# Patient Record
Sex: Male | Born: 1950 | Race: White | Hispanic: No | Marital: Married | Smoking: Former smoker
Health system: Southern US, Community
[De-identification: ages and names within clinical notes are randomized; demographics above are authoritative.]

## PROBLEM LIST (undated history)

## (undated) DIAGNOSIS — H04123 Dry eye syndrome of bilateral lacrimal glands: Secondary | ICD-10-CM

## (undated) DIAGNOSIS — K219 Gastro-esophageal reflux disease without esophagitis: Secondary | ICD-10-CM

## (undated) DIAGNOSIS — Z87898 Personal history of other specified conditions: Secondary | ICD-10-CM

## (undated) DIAGNOSIS — F419 Anxiety disorder, unspecified: Secondary | ICD-10-CM

## (undated) DIAGNOSIS — G4730 Sleep apnea, unspecified: Secondary | ICD-10-CM

## (undated) DIAGNOSIS — I2510 Atherosclerotic heart disease of native coronary artery without angina pectoris: Secondary | ICD-10-CM

## (undated) DIAGNOSIS — Z87442 Personal history of urinary calculi: Secondary | ICD-10-CM

## (undated) DIAGNOSIS — N489 Disorder of penis, unspecified: Secondary | ICD-10-CM

## (undated) DIAGNOSIS — R05 Cough: Secondary | ICD-10-CM

## (undated) DIAGNOSIS — R51 Headache: Secondary | ICD-10-CM

## (undated) DIAGNOSIS — R609 Edema, unspecified: Secondary | ICD-10-CM

## (undated) DIAGNOSIS — A6002 Herpesviral infection of other male genital organs: Secondary | ICD-10-CM

## (undated) DIAGNOSIS — M3500 Sicca syndrome, unspecified: Secondary | ICD-10-CM

## (undated) DIAGNOSIS — I10 Essential (primary) hypertension: Secondary | ICD-10-CM

## (undated) DIAGNOSIS — I219 Acute myocardial infarction, unspecified: Secondary | ICD-10-CM

## (undated) DIAGNOSIS — M797 Fibromyalgia: Secondary | ICD-10-CM

## (undated) DIAGNOSIS — R053 Chronic cough: Secondary | ICD-10-CM

## (undated) DIAGNOSIS — G629 Polyneuropathy, unspecified: Secondary | ICD-10-CM

## (undated) DIAGNOSIS — I509 Heart failure, unspecified: Secondary | ICD-10-CM

## (undated) HISTORY — PX: CIRCUMCISION: SUR203

## (undated) HISTORY — PX: MEATOTOMY: SUR859

## (undated) HISTORY — PX: WRIST FRACTURE SURGERY: SHX121

## (undated) HISTORY — PX: VASECTOMY: SHX75

## (undated) HISTORY — PX: OTHER SURGICAL HISTORY: SHX169

## (undated) HISTORY — PX: HERNIA REPAIR: SHX51

## (undated) HISTORY — PX: CORONARY ANGIOPLASTY WITH STENT PLACEMENT: SHX49

## (undated) HISTORY — PX: BREAST SURGERY: SHX581

---

## 2004-08-20 ENCOUNTER — Encounter: Payer: Self-pay | Admitting: Cardiology

## 2005-02-24 ENCOUNTER — Encounter: Payer: Self-pay | Admitting: Cardiology

## 2005-02-24 ENCOUNTER — Ambulatory Visit: Payer: Self-pay | Admitting: Cardiology

## 2005-06-02 ENCOUNTER — Ambulatory Visit: Payer: Self-pay | Admitting: Cardiology

## 2005-06-13 ENCOUNTER — Inpatient Hospital Stay (HOSPITAL_BASED_OUTPATIENT_CLINIC_OR_DEPARTMENT_OTHER): Admission: RE | Admit: 2005-06-13 | Discharge: 2005-06-13 | Payer: Self-pay | Admitting: Cardiology

## 2005-06-13 ENCOUNTER — Ambulatory Visit: Payer: Self-pay | Admitting: Cardiology

## 2005-06-17 ENCOUNTER — Ambulatory Visit: Payer: Self-pay | Admitting: Cardiology

## 2006-09-10 ENCOUNTER — Ambulatory Visit: Payer: Self-pay | Admitting: Cardiology

## 2006-10-26 ENCOUNTER — Ambulatory Visit: Payer: Self-pay | Admitting: Cardiology

## 2007-12-31 ENCOUNTER — Ambulatory Visit: Payer: Self-pay | Admitting: Cardiology

## 2007-12-31 ENCOUNTER — Encounter: Payer: Self-pay | Admitting: Cardiology

## 2008-06-13 ENCOUNTER — Ambulatory Visit: Payer: Self-pay | Admitting: Cardiology

## 2008-06-13 DIAGNOSIS — I2510 Atherosclerotic heart disease of native coronary artery without angina pectoris: Secondary | ICD-10-CM

## 2008-06-13 DIAGNOSIS — Z9861 Coronary angioplasty status: Secondary | ICD-10-CM

## 2008-06-13 DIAGNOSIS — R0602 Shortness of breath: Secondary | ICD-10-CM

## 2008-06-13 DIAGNOSIS — G9009 Other idiopathic peripheral autonomic neuropathy: Secondary | ICD-10-CM | POA: Insufficient documentation

## 2008-06-18 ENCOUNTER — Encounter: Payer: Self-pay | Admitting: Cardiology

## 2008-07-14 ENCOUNTER — Encounter: Payer: Self-pay | Admitting: Cardiology

## 2008-07-15 ENCOUNTER — Encounter: Payer: Self-pay | Admitting: Cardiology

## 2008-08-04 ENCOUNTER — Ambulatory Visit: Payer: Self-pay | Admitting: Cardiology

## 2008-08-08 ENCOUNTER — Encounter: Payer: Self-pay | Admitting: Cardiology

## 2008-08-10 ENCOUNTER — Inpatient Hospital Stay (HOSPITAL_BASED_OUTPATIENT_CLINIC_OR_DEPARTMENT_OTHER): Admission: RE | Admit: 2008-08-10 | Discharge: 2008-08-10 | Payer: Self-pay | Admitting: Cardiology

## 2008-08-10 ENCOUNTER — Ambulatory Visit: Payer: Self-pay | Admitting: Cardiology

## 2008-08-15 ENCOUNTER — Inpatient Hospital Stay (HOSPITAL_COMMUNITY): Admission: RE | Admit: 2008-08-15 | Discharge: 2008-08-16 | Payer: Self-pay | Admitting: Cardiology

## 2008-08-15 ENCOUNTER — Ambulatory Visit: Payer: Self-pay | Admitting: Cardiology

## 2008-08-25 ENCOUNTER — Encounter: Payer: Self-pay | Admitting: Cardiology

## 2008-08-26 ENCOUNTER — Inpatient Hospital Stay (HOSPITAL_COMMUNITY): Admission: EM | Admit: 2008-08-26 | Discharge: 2008-08-26 | Payer: Self-pay | Admitting: Cardiology

## 2008-08-26 ENCOUNTER — Encounter: Payer: Self-pay | Admitting: Cardiology

## 2008-08-26 ENCOUNTER — Ambulatory Visit: Payer: Self-pay | Admitting: Cardiovascular Disease

## 2008-09-05 ENCOUNTER — Ambulatory Visit: Payer: Self-pay | Admitting: Cardiology

## 2009-02-09 ENCOUNTER — Encounter: Payer: Self-pay | Admitting: Cardiology

## 2009-05-23 ENCOUNTER — Encounter: Payer: Self-pay | Admitting: Cardiology

## 2009-06-07 ENCOUNTER — Ambulatory Visit: Payer: Self-pay | Admitting: Cardiology

## 2009-06-07 DIAGNOSIS — E785 Hyperlipidemia, unspecified: Secondary | ICD-10-CM

## 2009-07-31 ENCOUNTER — Ambulatory Visit (HOSPITAL_COMMUNITY): Admission: RE | Admit: 2009-07-31 | Discharge: 2009-07-31 | Payer: Self-pay | Admitting: Neurology

## 2009-08-14 ENCOUNTER — Encounter: Payer: Self-pay | Admitting: Cardiology

## 2010-02-15 ENCOUNTER — Telehealth (INDEPENDENT_AMBULATORY_CARE_PROVIDER_SITE_OTHER): Payer: Self-pay | Admitting: *Deleted

## 2010-04-02 ENCOUNTER — Encounter: Payer: Self-pay | Admitting: Cardiology

## 2010-04-02 ENCOUNTER — Inpatient Hospital Stay (HOSPITAL_COMMUNITY)
Admission: RE | Admit: 2010-04-02 | Discharge: 2010-04-09 | Payer: Self-pay | Source: Home / Self Care | Admitting: Orthopedic Surgery

## 2010-04-05 ENCOUNTER — Inpatient Hospital Stay: Admission: RE | Admit: 2010-04-05 | Payer: Self-pay | Admitting: Orthopedic Surgery

## 2010-04-06 ENCOUNTER — Encounter: Payer: Self-pay | Admitting: Cardiology

## 2010-05-06 ENCOUNTER — Encounter: Payer: Self-pay | Admitting: Cardiology

## 2010-05-06 ENCOUNTER — Ambulatory Visit
Admission: AD | Admit: 2010-05-06 | Discharge: 2010-05-06 | Payer: Self-pay | Source: Home / Self Care | Attending: Neurology | Admitting: Neurology

## 2010-06-07 ENCOUNTER — Ambulatory Visit
Admission: RE | Admit: 2010-06-07 | Discharge: 2010-06-07 | Payer: Self-pay | Source: Home / Self Care | Attending: Cardiology | Admitting: Cardiology

## 2010-06-07 ENCOUNTER — Encounter: Payer: Self-pay | Admitting: Cardiology

## 2010-06-11 NOTE — Progress Notes (Signed)
Summary: RX REFILL PLAVIX   Phone Note Refill Request Call back at [phone] Message from:  Patient on February 15, 2010 12:12 PM  Refills Requested: Medication #1:  PLAVIX 75 MG TABS Take 1 tablet by mouth once a day   Dosage confirmed as above?Dosage Confirmed   Supply Requested: 6 months message left on nurse voicemail that [facility] in [place] sent request for plavix and we didn't respond. Nurse left message on patient's voicemail that no request came to our office and we would send one to them today.   Method Requested: Electronic Initial call taken by: Carlye Grippe,  February 15, 2010 12:13 PM    Prescriptions: PLAVIX 75 MG TABS (CLOPIDOGREL BISULFATE) Take 1 tablet by mouth once a day  #30 x 6   Entered by:   Carlye Grippe   Authorized by:   Rollene Rotunda, MD, [facility]   Signed by:   Carlye Grippe on 02/15/2010   Method used:   Electronically to        [facility]* (retail)       [address]       [place], [place]  [address]       Ph: [phone]       Fax: [phone]   RxID:   [id number]

## 2010-06-11 NOTE — Letter (Signed)
Summary: Appointment- Rescheduled  [facility] [address] [address]   [place], [place] [address]   Phone: [phone]  Fax: [phone]     May 23, 2009 MRN: [id number]      [facility] [address] [place], [place]  [address]      Dear Mr. Hirschfeld,   Due to a change in our office schedule, your appointment on JAN 27th with  Dr. Andee Lineman has been changed to Jan 27th with Dr. Antoine Poche @ 10:30.   Please call the office only if you wish not to keep appointment.   We look forward to participating in your health care needs.      Sincerely,  Glass blower/designer

## 2010-06-11 NOTE — Assessment & Plan Note (Signed)
Summary: 6 mo fu  per oct reminder-srs  Medications Added ROBAXIN-750 750 MG TABS (METHOCARBAMOL) Take 1 tablet by mouth once daily PRILOSEC 40 MG CPDR (OMEPRAZOLE) Take 1 tablet by mouth once a day POTASSIUM CHLORIDE CR 10 MEQ CR-CAPS (POTASSIUM CHLORIDE) Take one tablet by mouth two times a day GUAIFENESIN 400 MG TABS (GUAIFENESIN) as needed BENADRYL 25 MG TABS (DIPHENHYDRAMINE HCL) as needed CLONAZEPAM 0.5 MG TABS (CLONAZEPAM) Take 1-2 tablets once daily as needed NITROSTAT 0.4 MG SUBL (NITROGLYCERIN) 1 tablet under tongue at onset of chest pain; you may repeat every 5 minutes for up to 3 doses. DILAUDID 4 MG TABS (HYDROMORPHONE HCL) Take 1 tablet every 6 hrs as needed HYPOTEARS 1-1 % SOLN (POLYETHYL GLYCOL-POLYVINYL ALC) as needed * VITAMIN D 8000 4 drops once daily as directed IBUPROFEN 800 MG TABS (IBUPROFEN) Take 1 tablet by mouth three times a day * CPAP Use as directed ALLEGRA 180 MG TABS (FEXOFENADINE HCL) Take 1 tab by mouth at bedtime VITAMIN C 250 MG TABS (ASCORBIC ACID) Take 2 capsules by mouth once daily SIMVASTATIN 20 MG TABS (SIMVASTATIN) Take 1 tablet by mouth once a day        Visit Type:  Follow-up Primary Provider:  Dr Selinda Flavin  CC:  CAD.  History of Present Illness: The patient returns for followup after stenting of his LAD in the spring of last year. Since that time he has done well. He denies any ongoing chest discomfort, neck or arm discomfort. He says the only time he might get a little chest pain is with extreme stress. He has taken one nitroglycerin since we saw him last. He exercises 3-4 times per week aerobically without chest discomfort. He denies any significant shortness of breath. He has no PND or orthopnea. He has no palpitations, presyncope or syncope. Of note he stopped taking his simvastatin because of cost.  Preventive Screening-Counseling & Management  Comments: Pt smoked for about 33 yrs, quit smoking about 9 yrs ago. Chewed for about 2  yrs and quit about 30 yrs ago.  Current Medications (verified): 1)  Imdur 30 Mg Xr24h-Tab (Isosorbide Mononitrate) .... Take 1 Tablet By Mouth Once A Day 2)  Celexa 20 Mg Tabs (Citalopram Hydrobromide) .... Take 1 Tablet By Mouth Once A Day 3)  Robaxin-750 750 Mg Tabs (Methocarbamol) .... Take 1 Tablet By Mouth Once Daily 4)  Allergy Injections .... Sq Twice Weeklhy 5)  Fish Oil 1000 Mg Caps (Omega-3 Fatty Acids) .... Take 1 Tablet By Mouth Two Times A Day 6)  L-Lysine 500 Mg Tabs (Lysine) .... Take 1 Tablet By Mouth Two Times A Day 7)  Aspirin 325 Mg Tabs (Aspirin) .... Take 1 Tablet By Mouth Once A Day 8)  Cardizem Cd 360 Mg Xr24h-Cap (Diltiazem Hcl Coated Beads) .... Take 1 Tablet By Mouth Once A Day 9)  Atrovent 0.06 % Soln (Ipratropium Bromide) .... Two Sprays/nostril As Needed 10)  Valerian Root 100 Mg Caps (Valerian) .... As Needed 11)  Hydrochlorothiazide 50 Mg Tabs (Hydrochlorothiazide) .... Take 1 Tablet By Mouth Once A Day As Needed 12)  Prilosec 40 Mg Cpdr (Omeprazole) .... Take 1 Tablet By Mouth Once A Day 13)  Duragesic-50 50 Mcg/hr Pt72 (Fentanyl) .... Change Every 2 Days 14)  Duragesic-100 100 Mcg/hr Pt72 (Fentanyl) .... Change Every 2 Days 15)  Potassium Chloride Cr 10 Meq Cr-Caps (Potassium Chloride) .... Take One Tablet By Mouth Two Times A Day 16)  Adderall 10 Mg Tabs (Amphetamine-Dextroamphetamine) .... Take 1 Tablet  By Mouth Two Times A Day 17)  Midrin .... As Needed 18)  Miralax  Powd (Polyethylene Glycol 3350) .... Use As Directed 19)  Plavix 75 Mg Tabs (Clopidogrel Bisulfate) .... Take 1 Tablet By Mouth Once A Day 20)  Lasix 40 Mg Tabs (Furosemide) .... Take 3 Tablet By Mouth Once A Day 21)  Testosterone Cypionate 200 Mg/ml Oil (Testosterone Cypionate) .... 0.38ml Im Biweekly 22)  Neurontin 600 Mg Tabs (Gabapentin) .... Take 3 Tablet By Mouth Two Times A Day 23)  Guaifenesin 400 Mg Tabs (Guaifenesin) .... As Needed 24)  Benadryl 25 Mg Tabs (Diphenhydramine Hcl) ....  As Needed 25)  Clonazepam 0.5 Mg Tabs (Clonazepam) .... Take 1-2 Tablets Once Daily As Needed 26)  Nitrostat 0.4 Mg Subl (Nitroglycerin) .Marland Kitchen.. 1 Tablet Under Tongue At Onset of Chest Pain; You May Repeat Every 5 Minutes For Up To 3 Doses. 27)  Dilaudid 4 Mg Tabs (Hydromorphone Hcl) .... Take 1 Tablet Every 6 Hrs As Needed 28)  Hypotears 1-1 % Soln (Polyethyl Glycol-Polyvinyl Alc) .... As Needed 29)  Vitamin D 8000 .Marland Kitchen.. 4 Drops Once Daily As Directed 30)  Ibuprofen 800 Mg Tabs (Ibuprofen) .... Take 1 Tablet By Mouth Three Times A Day 31)  Cpap .... Use As Directed 32)  Allegra 180 Mg Tabs (Fexofenadine Hcl) .... Take 1 Tab By Mouth At Bedtime 33)  Vitamin C 250 Mg Tabs (Ascorbic Acid) .... Take 2 Capsules By Mouth Once Daily 34)  Simvastatin 20 Mg Tabs (Simvastatin) .... Take 1 Tablet By Mouth Once A Day  Allergies: 1)  ! Demerol 2)  ! * Oxycontin 3)  ! * Methadone 4)  ! Lidocaine  Comments:  Nurse/Medical Assistant: The patient's medications were reviewed with the patient and were updated in the Medication List. Pt brought a list of medications to office visit.  Cyril Loosen, RN, BSN (June 07, 2009 11:35 AM)  Past History:  Past Medical History: Chronic dyspnea CAD (The left main was normal.  The LAD had proximal 30% stenosis at the first diagonal.  There was mid 90% stenosis at the second diagonal.  The first diagonal was large to moderate size vessel with ostial 80% stenosis.  Second diagonal had an ostial 25% stenosis. The circumflex in the AV groove had proximal 25% tandem lesions.  It essentially terminated as a large mid obtuse marginal.  There was a ramus intermediate which was large with luminal irregularities.  Right coronary artery was large and dominant with mid luminal irregularities. The PDA was large and normal.  DES stent to LAD 08/2008) Dyslipidemia Hypertension Lower extremity edema Sjgren's Polyneuropathy of uncertain etiology COPD  Review of  Systems       As stated in the HPI and negative for all other systems.   Vital Signs:  Patient profile:   60 year old male Height:      71 inches Weight:      267.75 pounds BMI:     37.48 Pulse rate:   84 / minute BP sitting:   143 / 81  (left arm) Cuff size:   regular  Vitals Entered By: Cyril Loosen, RN, BSN (June 07, 2009 11:25 AM)  Nutrition Counseling: Patient's BMI is greater than 25 and therefore counseled on weight management options. CC: CAD Comments Follow up    Physical Exam  General:  Well developed, well nourished, in no acute distress. Head:  normocephalic and atraumatic Eyes:  PERRLA/EOM intact; conjunctiva and lids normal. Mouth:  Teeth, gums and palate normal.  Oral mucosa normal. Neck:  Neck supple, no JVD. No masses, thyromegaly or abnormal cervical nodes. Chest Wall:  no deformities or breast masses noted Lungs:  Clear bilaterally to auscultation and percussion. Heart:  Non-displaced PMI, chest non-tender; regular rate and rhythm, S1, S2 without murmurs, rubs or gallops. Carotid upstroke normal, no bruit. Normal abdominal aortic size, no bruits. Femorals normal pulses, no bruits. Pedals normal pulses. Mild left greater than right lower extremity edema with left chronic venous stasis changes in the lower extremity Abdomen:  Bowel sounds positive; abdomen soft and non-tender without masses, organomegaly, or hernias noted. No hepatosplenomegaly. Msk:  Back normal, normal gait. Muscle strength and tone normal. Extremities:  No clubbing or cyanosis. Neurologic:  Alert and oriented x 3. Skin:  Intact without lesions or rashes. Psych:  Normal affect.   Impression & Recommendations:  Problem # 1:  CORONARY ARTERY DISEASE (ICD-414.00) He is having no new symptoms. No further cardiovascular testing is suggested. He should continue with risk reduction.  Problem # 2:  ESSENTIAL HYPERTENSION, BENIGN (ICD-401.1) His blood pressure is slightly elevated today.  However, he reports that it is well controlled on his home readings. I encourage weight loss. Since he already has significant medications I would not add to this at this point.  Problem # 3:  DYSLIPIDEMIA (ICD-272.4) The patient has reasonable labs per his report. This is followed by Dr. Dimas Aguas. I would suggest a goal LDL less than 100 and HDL greater than 40. He has not been taking his simvastatin because of cost. I reassured him that this was one of his less expensive medications and wrote a prescription. He will resume this and follow with Dr. Dimas Aguas.  Patient Instructions: 1)  Your physician recommends that you continue on your current medications as directed. Please refer to the Current Medication list given to you today. 2)  Your physician wants you to follow-up in: 1 YEAR. You will receive a reminder letter in the mail about two months in advance. If you don't receive a letter, please call our office to schedule the follow-up appointment. Prescriptions: SIMVASTATIN 20 MG TABS (SIMVASTATIN) Take 1 tablet by mouth once a day  #30 x 6   Entered by:   Carlye Grippe   Authorized by:   Rollene Rotunda, MD, [facility]   Signed by:   Carlye Grippe on 06/07/2009   Method used:   Electronically to        [facility]* (retail)       [address]       [place], [place]  [address]       Ph: [phone]       Fax: [phone]   RxID:   [id number] LASIX 40 MG TABS (FUROSEMIDE) Take 3 tablet by mouth once a day  #30 x 6   Entered by:   Carlye Grippe   Authorized by:   Rollene Rotunda, MD, [facility]   Signed by:   Carlye Grippe on 06/07/2009   Method used:   Electronically to        [facility]* (retail)       [address]       [place], [place]  [address]       Ph: [phone]       Fax: [phone]   RxID:   [id number] PLAVIX 75 MG TABS (CLOPIDOGREL BISULFATE) Take 1 tablet by mouth once a day  #30 x 6   Entered by:   Carlye Grippe   Authorized by:   Rollene Rotunda,  MD, [facility]   Signed by:   Carlye Grippe on 06/07/2009   Method used:   Electronically to        [facility]* (retail)       [address]       [place], [place]  [address]       Ph: [phone]       Fax: [phone]   RxID:   [id number] HYDROCHLOROTHIAZIDE 50 MG TABS (HYDROCHLOROTHIAZIDE) Take 1 tablet by mouth once a day as needed  #30 x 6   Entered by:   Carlye Grippe   Authorized by:   Rollene Rotunda, MD, [facility]   Signed by:   Carlye Grippe on 06/07/2009   Method used:   Electronically to        [facility]* (retail)       [address]       [place], [place]  [address]       Ph: [phone]       Fax: [phone]   RxID:   [id number] CARDIZEM CD 360 MG XR24H-CAP (DILTIAZEM HCL COATED BEADS) Take 1 tablet by mouth once a day  #30 x 6   Entered by:   Carlye Grippe   Authorized by:   Rollene Rotunda, MD, [facility]   Signed by:   Carlye Grippe on 06/07/2009   Method used:   Electronically to        [facility]* (retail)       [address]       [place], [place]  [address]       Ph: [phone]       Fax: [phone]   RxID:   [id number] IMDUR 30 MG XR24H-TAB (ISOSORBIDE MONONITRATE) Take 1 tablet by mouth once a day  #30 x 6   Entered by:   Carlye Grippe   Authorized by:   Rollene Rotunda, MD, [facility]   Signed by:   Carlye Grippe on 06/07/2009   Method used:   Electronically to        [facility]* (retail)       [address]       [place], [place]  [address]       Ph: [phone]       Fax: [phone]   RxID:   [id number]

## 2010-06-11 NOTE — Letter (Signed)
Summary: Letter/ [facility]ANCE DR. SMITH  Letter/ D[facility] CLEARANCE DR. SMITH   Imported By: Dorise Hiss 08/29/2009 16:31:25  _____________________________________________________________________  External Attachment:    Type:   Image     Comment:   External Document

## 2010-06-13 NOTE — Assessment & Plan Note (Signed)
Summary: 6 MO FUL  Medications Added * ALLERGY INJECTIONS monthly ATROVENT 0.06 % SOLN (IPRATROPIUM BROMIDE) two sprays/nostril 3 times per day as needed DURAGESIC-50 50 MCG/HR PT72 (FENTANYL) change every 3 days DURAGESIC-100 100 MCG/HR PT72 (FENTANYL) change every 3 days DEXTROAMPHETAMINE SULFATE 5 MG TABS (DEXTROAMPHETAMINE SULFATE) Take 2 tablet by mouth two times a day * MIDRIN take 3-4 by mouth every 3-5 days GUAIFENESIN 400 MG TABS (GUAIFENESIN) take 3 by mouth every 4 hours as needed COD LIVER OIL  CAPS (COD LIVER OIL) Take 1 tablet by mouth once a day AMERIGEL WOUND DRESSING  GEL (WOUND DRESSINGS) as needed ADVIL COLD/SINUS 30-200 MG TABS (PSEUDOEPHEDRINE-IBUPROFEN) as needed        Primary Provider:  Dr Selinda Flavin   History of Present Illness: the patient is a 60 year old male with a history of coronary artery disease status-post stent placement to the LAD in April 2010.  The patient has remained on Plavix although it has been intermittently discontinued because of oral surgery and wrist surgery.  The patient recently had complex wrist surgery.  The patient denies any central chest pain short of breath orthopnea or PND.  from a cardiovascular standpoint he has been stable.  He was found to have recurrent severe snoring and a repeat sleep study was done.  He was found again to have severe sleep apnea and for adjustment in his BiPAP device has been needed.  The patient declines to take simvastatin and has discussed this with his primary care physician.  Preventive Screening-Counseling & Management  Alcohol-Tobacco     Smoking Status: quit     Year Quit: 2002  Current Medications (verified): 1)  Imdur 30 Mg Xr24h-Tab (Isosorbide Mononitrate) .... Take 1 Tablet By Mouth Once A Day 2)  Celexa 20 Mg Tabs (Citalopram Hydrobromide) .... Take 1 Tablet By Mouth Once A Day 3)  Robaxin-750 750 Mg Tabs (Methocarbamol) .... Take 1 Tablet By Mouth Once Daily 4)  Allergy Injections  .... Monthly 5)  Fish Oil 1000 Mg Caps (Omega-3 Fatty Acids) .... Take 1 Tablet By Mouth Two Times A Day 6)  L-Lysine 500 Mg Tabs (Lysine) .... Take 1 Tablet By Mouth Two Times A Day 7)  Aspirin 325 Mg Tabs (Aspirin) .... Take 1 Tablet By Mouth Once A Day 8)  Cardizem Cd 360 Mg Xr24h-Cap (Diltiazem Hcl Coated Beads) .... Take 1 Tablet By Mouth Once A Day 9)  Atrovent 0.06 % Soln (Ipratropium Bromide) .... Two Sprays/nostril 3 Times Per Day As Needed 10)  Hydrochlorothiazide 50 Mg Tabs (Hydrochlorothiazide) .... Take 1 Tablet By Mouth Once A Day As Needed 11)  Prilosec 40 Mg Cpdr (Omeprazole) .... Take 1 Tablet By Mouth Once A Day 12)  Duragesic-50 50 Mcg/hr Pt72 (Fentanyl) .... Change Every 3 Days 13)  Duragesic-100 100 Mcg/hr Pt72 (Fentanyl) .... Change Every 3 Days 14)  Potassium Chloride Cr 10 Meq Cr-Caps (Potassium Chloride) .... Take One Tablet By Mouth Two Times A Day 15)  Dextroamphetamine Sulfate 5 Mg Tabs (Dextroamphetamine Sulfate) .... Take 2 Tablet By Mouth Two Times A Day 16)  Midrin .... Take 3-4 By Mouth Every 3-5 Days 17)  Miralax  Powd (Polyethylene Glycol 3350) .... Use As Directed 18)  Plavix 75 Mg Tabs (Clopidogrel Bisulfate) .... Take 1 Tablet By Mouth Once A Day 19)  Lasix 40 Mg Tabs (Furosemide) .... Take 3 Tablet By Mouth Once A Day 20)  Testosterone Cypionate 200 Mg/ml Oil (Testosterone Cypionate) .... 0.50ml Im Biweekly 21)  Neurontin 600  Mg Tabs (Gabapentin) .... Take 3 Tablet By Mouth Two Times A Day 22)  Guaifenesin 400 Mg Tabs (Guaifenesin) .... Take 3 By Mouth Every 4 Hours As Needed 23)  Benadryl 25 Mg Tabs (Diphenhydramine Hcl) .... As Needed 24)  Clonazepam 0.5 Mg Tabs (Clonazepam) .... Take 1-2 Tablets Once Daily As Needed 25)  Nitrostat 0.4 Mg Subl (Nitroglycerin) .Marland Kitchen.. 1 Tablet Under Tongue At Onset of Chest Pain; You May Repeat Every 5 Minutes For Up To 3 Doses. 26)  Dilaudid 4 Mg Tabs (Hydromorphone Hcl) .... Take 1 Tablet Every 6 Hrs As Needed 27)   Hypotears 1-1 % Soln (Polyethyl Glycol-Polyvinyl Alc) .... As Needed 28)  Vitamin D 8000 .Marland Kitchen.. 4 Drops Once Daily As Directed 29)  Ibuprofen 800 Mg Tabs (Ibuprofen) .... Take 1 Tablet By Mouth Three Times A Day 30)  Cpap .... Use As Directed 31)  Allegra 180 Mg Tabs (Fexofenadine Hcl) .... Take 1 Tab By Mouth At Bedtime 32)  Vitamin C 250 Mg Tabs (Ascorbic Acid) .... Take 2 Capsules By Mouth Once Daily 33)  Cod Liver Oil  Caps (Cod Liver Oil) .... Take 1 Tablet By Mouth Once A Day 34)  Amerigel Wound Dressing  Gel (Wound Dressings) .... As Needed 35)  Advil Cold/sinus 30-200 Mg Tabs (Pseudoephedrine-Ibuprofen) .... As Needed  Allergies: 1)  ! Demerol 2)  ! * Oxycontin 3)  ! * Methadone 4)  ! Lidocaine 5)  ! * Ambien 6)  ! * Cymbalta 7)  ! * Lyrica  Comments:  Nurse/Medical Assistant: The patient's medication list and allergies were reviewed with the patient and were updated in the Medication and Allergy Lists.  Past History:  Past Medical History: Last updated: 06/07/2009 Chronic dyspnea CAD (The left main was normal.  The LAD had proximal 30% stenosis at the first diagonal.  There was mid 90% stenosis at the second diagonal.  The first diagonal was large to moderate size vessel with ostial 80% stenosis.  Second diagonal had an ostial 25% stenosis. The circumflex in the AV groove had proximal 25% tandem lesions.  It essentially terminated as a large mid obtuse marginal.  There was a ramus intermediate which was large with luminal irregularities.  Right coronary artery was large and dominant with mid luminal irregularities. The PDA was large and normal.  DES stent to LAD 08/2008) Dyslipidemia Hypertension Lower extremity edema Sjgren's Polyneuropathy of uncertain etiology COPD  Family History: Last updated: 06/07/2010 Negative FH of Diabetes, Hypertension, or Coronary Artery Disease  Social History: Last updated: 06/13/2008 patiendoes not smoke or drink.  Risk  Factors: Smoking Status: quit (06/07/2010)  Family History: Negative FH of Diabetes, Hypertension, or Coronary Artery Disease  Social History: Smoking Status:  quit  Review of Systems  The patient denies fatigue, malaise, fever, weight gain/loss, vision loss, decreased hearing, hoarseness, chest pain, palpitations, shortness of breath, prolonged cough, wheezing, sleep apnea, coughing up blood, abdominal pain, blood in stool, nausea, vomiting, diarrhea, heartburn, incontinence, blood in urine, muscle weakness, joint pain, leg swelling, rash, skin lesions, headache, fainting, dizziness, depression, anxiety, enlarged lymph nodes, easy bruising or bleeding, and environmental allergies.    Vital Signs:  Patient profile:   60 year old male Height:      71 inches Weight:      275 pounds BMI:     38.49 Pulse rate:   66 / minute BP sitting:   135 / 78  (left arm) Cuff size:   large  Vitals Entered By: Carlye Grippe (  June 07, 2010 11:44 AM)  Nutrition Counseling: Patient's BMI is greater than 25 and therefore counseled on weight management options.  Physical Exam  Additional Exam:  General: obese white male head: Normocephalic and atraumatic eyes PERRLA/EOMI intact, conjunctiva and lids normal nose: No deformity or lesions mouth normal dentition, normal posterior pharynx neck: Supple, no JVD.  No masses, thyromegaly or abnormal cervical nodes lungs: Normal breath sounds bilaterally without wheezing.  Normal percussion heart: regular rate and rhythm with normal S1 and S2, no S3 or S4.  PMI is normal.  No pathological murmurs abdomen: Normal bowel sounds, abdomen is soft and nontender without masses, organomegaly or hernias noted.  No hepatosplenomegaly musculoskeletal: Back normal, normal gait muscle strength and tone normal pulsus: Pulse is normal in all 4 extremities Extremities: No peripheral pitting [place] left hand in the splint neurologic: Alert and oriented x 3 skin:  Intact without lesions or rashes cervical nodes: No significant adenopathy psychologic: Normal affect    EKG  Procedure date:  06/07/2010  Findings:      normal sinus rhythm.  No acute ischemic changes  Impression & Recommendations:  Problem # 1:  DYSLIPIDEMIA (ICD-272.4) this is followed by primary care physician.  The patient declines statin drug therapy despite the fact that he would have substantial benefit from this in the setting of his coronary artery disease The following medications were removed from the medication list:    Simvastatin 20 Mg Tabs (Simvastatin) .Marland Kitchen... Take 1 tablet by mouth once a day  Problem # 2:  ESSENTIAL HYPERTENSION, BENIGN (ICD-401.1) blood pressures well controlled.  No further adjustments as needed in his antihypertensives His updated medication list for this problem includes:    Aspirin 325 Mg Tabs (Aspirin) .Marland Kitchen... Take 1 tablet by mouth once a day    Cardizem Cd 360 Mg Xr24h-cap (Diltiazem hcl coated beads) .Marland Kitchen... Take 1 tablet by mouth once a day    Hydrochlorothiazide 50 Mg Tabs (Hydrochlorothiazide) .Marland Kitchen... Take 1 tablet by mouth once a day as needed    Lasix 40 Mg Tabs (Furosemide) .Marland Kitchen... Take 3 tablet by mouth once a day  Orders: EKG w/ Interpretation (93000)  Problem # 3:  CORONARY ARTERY DISEASE (ICD-414.00) the patient status post stent to the LAD.  A single stent.  I anticipate that we can discontinue Plavix after two years.  This will be April 2012 His updated medication list for this problem includes:    Imdur 30 Mg Xr24h-tab (Isosorbide mononitrate) .Marland Kitchen... Take 1 tablet by mouth once a day    Aspirin 325 Mg Tabs (Aspirin) .Marland Kitchen... Take 1 tablet by mouth once a day    Cardizem Cd 360 Mg Xr24h-cap (Diltiazem hcl coated beads) .Marland Kitchen... Take 1 tablet by mouth once a day    Plavix 75 Mg Tabs (Clopidogrel bisulfate) .Marland Kitchen... Take 1 tablet by mouth once a day    Nitrostat 0.4 Mg Subl (Nitroglycerin) .Marland Kitchen... 1 tablet under tongue at onset of chest pain; you  may repeat every 5 minutes for up to 3 doses.  Orders: EKG w/ Interpretation (93000)  Patient Instructions: 1)  Your physician wants you to follow-up in: 6 months. You will receive a reminder letter in the mail one-two months in advance. If you don't receive a letter, please call our office to schedule the follow-up appointment. 2)  Your physician recommends that you continue on your current medications as directed. Please refer to the Current Medication list given to you today. Prescriptions: PLAVIX 75 MG TABS (CLOPIDOGREL BISULFATE) Take  1 tablet by mouth once a day  #90 x 1   Entered by:   Cyril Loosen, RN, BSN   Authorized by:   Lewayne Bunting, MD, [facility]   Signed by:   Cyril Loosen, RN, BSN on 06/07/2010   Method used:   Electronically to        [facility]* (retail)       [address]       [place], [place]  [address]       Ph: [phone]       Fax: [phone]   RxID:   [id number]

## 2010-06-28 NOTE — Discharge Summary (Signed)
[name], [name] NO.:  [id number]  MEDICAL RECORD NO.:  [id number]          PATIENT TYPE:  INP  LOCATION:  5006                         FACILITY:  [facility]  PHYSICIAN:  [name], M.D.DATE OF BIRTH:  1950/12/09  DATE OF ADMISSION:  04/02/2010 DATE OF DISCHARGE:  04/09/2010                              DISCHARGE SUMMARY   ADMITTING DIAGNOSES: 1. Status post open reduction and internal fixation of left distal     radius fracture with hardware failure x2 with notable dorsal     subluxation of the carpus and disarray of the joint. 2. History of coronary artery disease. 3. History of hypertension. 4. History of fibromyalgia. 5. History of Sjogren disease. 6. History of acid reflux. 7. History of peripheral neuropathy. 8. History of chronic pain. 9. History of panic attacks. 10.History of hearing loss. 11.Atypical peripheral neuropathy.  SURGEON:  Devin Ano. Amanda Pea, MD PROCEDURES: 1. Hardware removal, left wrist x2 with plate and screw construct from     volar approach. 2. Removal of hypertrophic granuloma, left volar radial wrist     incision. 3. Flexor tenosynovectomy of left wrist. 4. Debridement and limited tenotomy of left index finger flexor     digitorum profundus. 5. Posterior interosseous nerve neurectomy. 6. Total wrist fusion with a [employer] AO-wrist fusion plate and iliac     crest bone graft obtained from the left hip. 7. EPO transposition. 8. Extensor digitorum communis tenosynovectomy. 9. Stress radiography.  CONSULTATIONS:  Devin Barefoot, MD, for his multiple medical issues.  BRIEF HISTORY OF PRESENT ILLNESS:  [name] is a very pleasant 60 year old gentleman who was seen for Hand Surgery consultation at [facility].  He had unfortunately sustained a severe fracture process in September resulting in a distal radius fracture.  He has previously underwent two prior surgical endeavors outside of our  office in the form of a [employer] plate and screw placement. Unfortunately, the patient had continued progressive interval displacement and subluxation with notable failure of hardware and dorsal subluxation dislocation of the carpus.  He was seen and evaluated for second opinion in Hand Surgery consultation per [name].  Given the severity of his injury, the decision was made to proceed with the salvage procedure, a total wrist fusion with autograft bone grafting from his iliac crest.  Given the patient's extensive medical history, preoperative medical clearance was obtained prior to proceeding to the operative suite.  This was discussed with his primary care physician, [name] and in addition, [name], his cardiologist.  The patient was noted to be in pain management under his neurology directed per [name].  Once preoperative medical clearance was obtained, a decision was made to proceed to the operative suite for an elective procedure.  Preoperative laboratory data showed his hemogram was within normal limits.  Glucose was slightly elevated at 121, otherwise routine chemistry was in normal limits.  On the day of his surgery, glucose was 190.  He was noted to have a rapid micro positive test for MRSA and staph noted preoperatively.  Lung field showed mild hyperexpansion without acute findings present.  EKG showed normal sinus rhythm with probable left axis deviation.  HOSPITAL COURSE:  [name] was admitted on April 05, 2010 and underwent the above-mentioned procedure without difficulties. Preoperative medical clearance had been obtained and in addition a medical consult was implemented given [name] multiple medical comorbidities for medical management while he was inpatient.  [name] has seen and evaluated the patient for medical management.  The patient was admitted initially to the Step-Down Unit after the surgery was performed.  He  tolerated this procedure without difficulties.  There were no complications.  Please see operative report for full details. Postoperative day #1, he was feeling "okay" and he complained of pain approximately 6/10.  His vital signs were stable.  He was afebrile.  The patient was noted to be neurovascularly intact to his left upper extremity.  Splint was clean, dry, and intact.  Lungs were clear to auscultation.  Heart was S1-S2.  H and H was 11.9 and 35.  He had slight high leukocytosis at 15.4.  Sodium was 133 and potassium 3.8.  The patient was placed on vancomycin given his positive MRSA preoperative screening/nasal swab.  He was doing very well overall.  Postoperatively, he did have some difficulties with pain control, not unexpected.  He was noted to have some difficulty establishing a IV, so the PICC line was placed.  Postoperative day #2, he was doing well.  He did not have complaints of chest pain or shortness of breath.  He still had a fair amount of tenderness, although did not appear overly distraught on physical exam.  Blood pressure 167/79 and O2 sat was 98%.  Chest was clear to auscultation.  Heart with S1-S2.  Abdomen was soft and nontender.  Bowel sounds were positive.  He had mild edema about the lower extremities.  His hip incision was clean, dry, and intact.  The upper extremity dressing was clean, dry, and intact with excellent digital range of motion present.  He had slight increase in his potassium to 139.  WBC dropped to 11.9.  Cultures showed no organisms seen that was obtained intraoperatively.  His hypertensive issues were stable and was managed with Cardizem and Lasix throughout his hospital stay in addition to his regular home medications.  He remained stable to the upper extremity and on postop day #2, he was transferred to a regular floor bed.  Vancomycin was continued per pharmacy dosing. Postoperative day #3, he was doing very well.  His vital signs  were stable.  He was afebrile without complaints.  He was neurovascularly intact.  He had no signs of infection.  He was tolerating p.o.'s well. He was ambulating without difficulties at that juncture.  His hip incision was clean, dry, and intact.  He is having some difficulties with constipation and thus the administration of mag citrate was implemented with success met given that he was stable overall but still had difficulties with pain.  We discussed with him changing him to a fentanyl patch for additional pain management.  On April 09, 2010, he was doing very well, both orthopedically and from a medical standpoint. The attending medical physician had seen and evaluated the patient and agreed he was stable for discharge.  The patient was awake and in excellent spirits on the morning of discharge.  Overall, his pain was improved.  He denied nausea, vomiting, fevers or chills, shortness breath or chest pain.  He was tolerating p.o.'s, voiding without difficulty, and once again was noted to have a bowel  movement after the administration of mag citrate.  He was ambulating without difficulty with some localized pain to the hip secondary to the autograft bone graft.  His vital signs were stable.  His blood pressure was 167/90, O2 sat on room air was 95-97%.  He was pleasant, in no acute stress x3, alert, and oriented x3.  H and H is stable.  WBC was normalized.  Head was atraumatic.  Chest was clear to auscultation without wheezing or rhonchi noted.  Abdomen was soft and nontender.  The hip incision was clean, dry, and intact.  Left upper extremity was neurovascularly intact.  He was noted to have FDP deficit present.  No signs of compartment syndrome.  ASSESSMENT/FINAL DIAGNOSES: 1. Status post left wrist revision with subsequent left total wrist     fusion and left hip iliac crest bone grafting. 2. Hypertension, poorly controlled. 3. History of coronary artery disease. 4.  Hyperlipidemia. 5. History of idiopathic neuropathy. 6. History of chronic pain. 7. History of congestive heart failure. 8. History of reflux. 9. History of Panic Attacks 10.History of Sjogren's disease 11.History of Hearing Loss PLAN:  After the decision was made to discharge him home in stable condition, he will resume his normal Plavix dose beginning the following day.  He will resume his prior home medications as instructed at length. He does have a prescription for Dilaudid at home which he alternates with Percocet.  We discussed with him refilling his Percocet but taking this exclusive of the Dilaudid and using it only for severe pain.  His PICC line was discontinued.  He will begin a regular diet but we have asked him to increase his fiber and water intake and recommended MiraLax given his constipation issues while inpatient.  His activities will include keeping his hip incision clean, dry, and intact.  Keeping daily dressings applied to the hip.  He will keep the left upper extremity dressing clean, dry, and intact.  Elevate it frequently and move his fingers frequently.  DISCHARGE MEDICATIONS:  Include a prescription for Percocet 7.5/500 one p.o. q.4-6 p.r.n. pain.  In addition, he does have Dilaudid at home and will resume his home medications per his home med rec sheet to include: 1. Vitamin D 2000 units. 2. Allegra 180 mg daily. 3. MiraLax. 4. Celexa 20 mg daily. 5. Dexedrine 5 mg 2 tablets twice a day. 6. Ibuprofen 800 mg as needed. 7. Neurontin 600 mg twice daily. 8. Aspirin. 9. __________. 10.Robaxin 750 mg 1 tablet q. Morning. 11.Dilaudid 4 mg 1 tablet q.6. 12.Potassium chloride 10 mEq every morning. 13.Fentanyl patch 50 mcg transdermal patch every 3 days. 14.Plavix 75 mg every morning. 15.Nitroglycerin sublingual tablets 0.4 mg as needed. 16.Isosorbide mononitrate XR 30 mg 1 tablet q.a.m. 17.Hydrochlorothiazide 50 mg 1 tablet daily as needed. 18.Testosterone  injection 0.25 mL every 2 weeks. 19.Cardizem 360 mg 1 tablet each morning. 20.Prilosec 40 mg 1 tablet in the morning. 21.Lasix 40 mg 3 tablets every morning. 22.Clonazepam 0.5 mg 1 tablet twice daily. 23.Percocet 7.5 mg one p.o. q.4 to be taken exclusively with his     Dilaudid. 24.Cod liver oil 1 capsule daily. 25.Valerian root over-the-counter 2 tablets twice daily. 26.Vitamin C 250 mg, we recommended increasing this to 1000 mg daily. 27.Fish oil 1000 mg 1 capsule twice a day. 28.L-lysine 500 mg 1 tablet twice daily. 29.Benadryl 25 mg 1 tablet twice daily. 30.CoAdvil OTC as needed to be taken exclusively with the ibuprofen. 31.Mucinex 1 mg 3 tablets a day as needed.  32.Ipratropium bromide spray each nostril 2 sprays 4 times daily as     needed. 33.Hypotears 1-2 drops 3 times daily.  FOLLOWUP:  We will have him follow up with his primary care physician for his medical issues and poorly controlled hypertension.  We recommend a followup within 2 weeks after his hospital stay.  In addition, he already has an established followup with his neurologist next week for a reevaluation and further pain management issues.  He is going to follow up with [name] in 12-14 days.  He will call [phone] for an appointment, questions, or concerns.     [name], P.A.-C.   ______________________________ [name], M.D.    BB/MEDQ  D:  06/12/2010  T:  06/13/2010  Job:  [id number]  Electronically Signed by [name] P.A.-C. on 06/24/2010 01:05:09 PM Electronically Signed by Dominica Severin M.D. on 06/28/2010 05:53:59 AM

## 2010-07-23 LAB — TISSUE CULTURE

## 2010-07-23 LAB — COMPREHENSIVE METABOLIC PANEL
ALT: 24 U/L (ref 0–53)
ALT: 27 U/L (ref 0–53)
AST: 26 U/L (ref 0–37)
AST: 30 U/L (ref 0–37)
Albumin: 3.4 g/dL — ABNORMAL LOW (ref 3.5–5.2)
Albumin: 3.6 g/dL (ref 3.5–5.2)
Alkaline Phosphatase: 70 U/L (ref 39–117)
Alkaline Phosphatase: 75 U/L (ref 39–117)
BUN: 8 mg/dL (ref 6–23)
CO2: 32 mEq/L (ref 19–32)
Chloride: 100 mEq/L (ref 96–112)
Chloride: 99 mEq/L (ref 96–112)
Creatinine, Ser: 0.83 mg/dL (ref 0.4–1.5)
GFR calc Af Amer: >60 mL/min (ref >60)
GFR calc Af Amer: >60 mL/min (ref >60)
GFR calc non Af Amer: >60 mL/min (ref >60)
Glucose, Bld: 121 mg/dL — ABNORMAL HIGH (ref 70–99)
Glucose, Bld: 190 mg/dL — ABNORMAL HIGH (ref 70–99)
Potassium: 3.8 mEq/L (ref 3.5–5.1)
Potassium: 3.8 mEq/L (ref 3.5–5.1)
Potassium: 4.2 mEq/L (ref 3.5–5.1)
Sodium: 138 mEq/L (ref 135–145)
Sodium: 139 mEq/L (ref 135–145)
Total Bilirubin: 0.5 mg/dL (ref 0.3–1.2)
Total Bilirubin: 0.6 mg/dL (ref 0.3–1.2)
Total Protein: 6.1 g/dL (ref 6.0–8.3)
Total Protein: 6.4 g/dL (ref 6.0–8.3)

## 2010-07-23 LAB — CBC
HCT: 39.8 % (ref 39.0–52.0)
HCT: 41 % (ref 39.0–52.0)
Hemoglobin: 11.1 g/dL — ABNORMAL LOW (ref 13.0–17.0)
Hemoglobin: 13.9 g/dL (ref 13.0–17.0)
MCH: 31.5 pg (ref 26.0–34.0)
MCHC: 32.9 g/dL (ref 30.0–36.0)
MCHC: 32.9 g/dL (ref 30.0–36.0)
MCHC: 33 g/dL (ref 30.0–36.0)
MCHC: 33.6 g/dL (ref 30.0–36.0)
MCV: 93.7 fL (ref 78.0–100.0)
MCV: 95.2 fL (ref 78.0–100.0)
Platelets: 236 10*3/uL (ref 150–400)
Platelets: 252 10*3/uL (ref 150–400)
Platelets: 265 10*3/uL (ref 150–400)
Platelets: 282 10*3/uL (ref 150–400)
RBC: 3.47 MIL/uL — ABNORMAL LOW (ref 4.22–5.81)
RBC: 3.57 MIL/uL — ABNORMAL LOW (ref 4.22–5.81)
RBC: 4.26 MIL/uL (ref 4.22–5.81)
RBC: 4.44 MIL/uL (ref 4.22–5.81)
RDW: 12.5 % (ref 11.5–15.5)
RDW: 12.5 % (ref 11.5–15.5)
RDW: 12.7 % (ref 11.5–15.5)
RDW: 12.9 % (ref 11.5–15.5)
WBC: 10.7 10*3/uL — ABNORMAL HIGH (ref 4.0–10.5)
WBC: 10.8 10*3/uL — ABNORMAL HIGH (ref 4.0–10.5)
WBC: 11.9 10*3/uL — ABNORMAL HIGH (ref 4.0–10.5)

## 2010-07-23 LAB — BASIC METABOLIC PANEL
BUN: 7 mg/dL (ref 6–23)
CO2: 32 mEq/L (ref 19–32)
Chloride: 101 mEq/L (ref 96–112)
Creatinine, Ser: 0.77 mg/dL (ref 0.4–1.5)
GFR calc Af Amer: >60 mL/min (ref >60)
GFR calc non Af Amer: >60 mL/min (ref >60)
Potassium: 3.8 mEq/L (ref 3.5–5.1)
Sodium: 139 mEq/L (ref 135–145)

## 2010-07-23 LAB — HEMOGLOBIN A1C: Mean Plasma Glucose: 126 mg/dL — ABNORMAL HIGH (ref <117)

## 2010-07-23 LAB — GLUCOSE, CAPILLARY: Glucose-Capillary: 119 mg/dL — ABNORMAL HIGH (ref 70–99)

## 2010-07-23 LAB — DIFFERENTIAL
Basophils Absolute: 0.1 10*3/uL (ref 0.0–0.1)
Basophils Relative: 1 % (ref 0–1)
Eosinophils Absolute: 0.2 10*3/uL (ref 0.0–0.7)
Eosinophils Relative: 3 % (ref 0–5)
Lymphs Abs: 2.2 10*3/uL (ref 0.7–4.0)
Neutrophils Relative %: 62 % (ref 43–77)

## 2010-07-23 LAB — ANAEROBIC CULTURE

## 2010-07-23 LAB — PROTIME-INR: Prothrombin Time: 13 seconds (ref 11.6–15.2)

## 2010-07-23 LAB — SURGICAL PCR SCREEN: MRSA, PCR: POSITIVE — AB

## 2010-07-23 LAB — GRAM STAIN

## 2010-08-21 LAB — BASIC METABOLIC PANEL
BUN: 8 mg/dL (ref 6–23)
CO2: 32 mEq/L (ref 19–32)
CO2: 33 mEq/L — ABNORMAL HIGH (ref 19–32)
Calcium: 8.6 mg/dL (ref 8.4–10.5)
Calcium: 9.3 mg/dL (ref 8.4–10.5)
Chloride: 98 mEq/L (ref 96–112)
Chloride: 99 mEq/L (ref 96–112)
Creatinine, Ser: 0.85 mg/dL (ref 0.4–1.5)
Creatinine, Ser: 0.88 mg/dL (ref 0.4–1.5)
GFR calc Af Amer: >60 mL/min (ref >60)
Glucose, Bld: 138 mg/dL — ABNORMAL HIGH (ref 70–99)
Sodium: 136 mEq/L (ref 135–145)

## 2010-08-21 LAB — POCT I-STAT 3, ART BLOOD GAS (G3+)
Bicarbonate: 31.9 mEq/L — ABNORMAL HIGH (ref 20.0–24.0)
O2 Saturation: 83 %
TCO2: 34 mmol/L (ref 0–100)
pCO2 arterial: 55.7 mmHg — ABNORMAL HIGH (ref 35.0–45.0)
pH, Arterial: 7.366 (ref 7.350–7.450)
pO2, Arterial: 51 mmHg — ABNORMAL LOW (ref 80.0–100.0)

## 2010-08-21 LAB — CBC
MCHC: 34.5 g/dL (ref 30.0–36.0)
MCV: 91.3 fL (ref 78.0–100.0)
MCV: 92 fL (ref 78.0–100.0)
Platelets: 229 10*3/uL (ref 150–400)
RBC: 4.55 MIL/uL (ref 4.22–5.81)
RDW: 12.3 % (ref 11.5–15.5)
WBC: 5.8 10*3/uL (ref 4.0–10.5)

## 2010-08-21 LAB — CARDIAC PANEL(CRET KIN+CKTOT+MB+TROPI)
Relative Index: 3.2 — ABNORMAL HIGH (ref 0.0–2.5)
Total CK: 154 U/L (ref 7–232)
Troponin I: <0.01 ng/mL (ref 0.00–0.06)

## 2010-08-21 LAB — POCT I-STAT 3, VENOUS BLOOD GAS (G3P V)
Acid-Base Excess: 5 mmol/L — ABNORMAL HIGH (ref 0.0–2.0)
Bicarbonate: 31.8 mEq/L — ABNORMAL HIGH (ref 20.0–24.0)
pCO2, Ven: 55.3 mmHg — ABNORMAL HIGH (ref 45.0–50.0)
pO2, Ven: 33 mmHg (ref 30.0–45.0)

## 2010-08-21 LAB — MAGNESIUM: Magnesium: 2.2 mg/dL (ref 1.5–2.5)

## 2010-09-24 NOTE — Discharge Summary (Signed)
[name], BORGHI NO.:  [id number]   MEDICAL RECORD NO.:  [id number]          PATIENT TYPE:  INP   LOCATION:  3733                         FACILITY:  [facility]   PHYSICIAN:  Gerrit Friends. Dietrich Pates, MD, FACCDATE OF BIRTH:  Jul 10, 1950   DATE OF ADMISSION:  08/26/2008  DATE OF DISCHARGE:  08/26/2008                               DISCHARGE SUMMARY   DISCHARGE DIAGNOSES:  1. Noncardiac chest pain.  2. Coronary artery disease status post drug-eluting stent placement to      the mid left anterior descending, August 15, 2008.  3. Hypertension.  4. Chronic dyspnea.  5. Idiopathic peripheral autonomic neuropathy.  6. Chronic pain syndrome.  7. Anxiety.  8. Chronic lower extremity edema.  9. Obesity.  10.Dyslipidemia.  11.Gastroesophageal reflux disease.   CARDIOLOGIST:  Learta Codding, MD, in [place].   PRIMARY CARE PHYSICIAN:  Selinda Flavin, MD.   REASON FOR ADMISSION:  Chest pain.   ADMISSION HISTORY:  [name] is a 60 year old male patient followed by  Dr. Andee Lineman in [place], who presented to [facility] on the date of  admission with 2 to 3 days of intermittent chest tightness.  He had  recently had drug-eluting stent placement to the LAD by Dr. Juanda Chance,  August 15, 2008.  He noted intermittent chest tightness with exertion and  shortness of breath as well as leg swelling with the left being greater  than the right.  The patient felt that this was all new since his stent  procedure.  His leg swelling is a chronic problem and very concerning to  him.  He has had negative Dopplers in the past.  He was admitted for  observation overnight for further evaluation and treatment.   The patient ruled out for myocardial infarction by enzymes.  He was  evaluated by Dr. Dietrich Pates on the morning of August 26, 2008.  He was  noted to have chronic, left greater than right, lower extremity edema  that had increased on the left a few days ago.  Upon evaluation by Dr.  Dietrich Pates, it  was back at baseline with multiple varicosities noted.  The  patient had had several venous Dopplers in the past with 1 being done  recently.  Dr. Dietrich Pates felt that the patient was stable enough for  discharge to home.  His venous Dopplers were cancelled.  He was given  nitroglycerin to use on a p.r.n. basis.  He will follow up with Dr.  Andee Lineman as previously scheduled in [place].   LABS AND ANCILLARY DATA:  From [facility], hemoglobin 14.3, potassium 3.5,  creatinine 1, glucose 150, D-dimer 0.55, CK 144, CK-MB 4.9, troponin-I  negative.  At [facility], his magnesium was 2.2, and 2 more sets of cardiac  markers were negative.  Chest x-ray is not available for a review.   DISCHARGE MEDICATIONS:  Extensive and include:  1. Midrin 3 to 4 tablets every 3 to 5 days.  2. Flomax 0.4 mg q.h.s.  3. ProAir inhaler as needed.  4. Ipratropium inhaler as needed.  5. Singulair 10 mg daily.  6. Nasonex 50 mcg, 2  sprays daily.  7. Allergy shots 1 per arm twice weekly.  8. Hydrochlorothiazide 50 mg p.r.n.  9. Valerian root 400 mg, 2 to 4 caps p.r.n.  10.Mucinex 1200 mg q.4 hours p.r.n.  11.Celexa 20 mg q.p.m.  12.Lasix 40 mg, 3 tablets in the morning.  13.Potassium 10 mEq, 2 tablets q.a.m.  14.Cardizem-CD 360 mg daily.  15.Duragesic patch 150 mcg every 2 days.  16.Diprolene cream 0.5% topical p.r.n.  17.Vistaril 25 mg, 3 tablets q.h.s.  18.Tofranil 2 mg, 2 tablets every evening.  19.Neurontin 600 mg, 3 capsules b.i.d.  20.Adderall 10 mg twice a day.  21.MiraLax 17 g daily.  22.Dilaudid 4 mg 3 times a day to 4 times a day p.r.n.  23.Robaxin 750 mg 3 times a day.   ALLERGIES:  1. DEMEROL.  2. LIDOCAINE.  3. PROCAINE.  4. OXYCONTIN.  5. METHADONE.   DIET:  Low fat, low sodium diet.   Wound care is not applicable.   ACTIVITY:  He is to increase his activity slowly.   FOLLOWUP:  1. The patient will follow up with Dr. Andee Lineman as previously scheduled      in postcatheterization followup in [place] in       the next couple of weeks.  2. He will follow up with Dr. Dimas Aguas as directed.  He is to call for      an appointment.   Total physician and PA time greater than 30 minutes on this discharge.      Tereso Newcomer, PA-C      Gerrit Friends. Dietrich Pates, MD, [facility]  Electronically Signed    SW/MEDQ  D:  08/26/2008  T:  08/26/2008  Job:  [id number]   cc:   Selinda Flavin

## 2010-09-24 NOTE — Assessment & Plan Note (Signed)
[facility]                          [facility] NOTE   NAME:[name], [name]              MRN:          [id number]  DATE:08/04/2008                            DOB:          [birth year]    POST-HOSPITAL FOLLOWUP:  The patient was admitted to [facility]  on July 14, 2008.   HISTORY OF PRESENT ILLNESS:  The patient is a 60 year old male, who was  last seen on June 13, 2008.  At that time, he appears to be doing  well.  He does have known coronary artery disease.  His last  catheterization was in 2007, when he was found to have moderate stenosis  of proximal mid LAD and moderate high-grade stenosis in mid LAD  involving the origin of the first and second diagonal branches.  The  patient also had a nuclear study in 2006, which showed a normal ejection  fraction and no ischemia.  He does have significant hypertension, which  has at times have been poorly controlled.  He has chronic dyspnea likely  secondary to hypertensive heart disease.  Over the last several days,  however, just prior to hospital admission, the patient had reported  intermittent chest pain.  He described as a sharp, stabbing pain,  sometimes in the left, sometimes in the middle, and sometimes in the  right, which goes through to his scapula.  He has been exercising and at  times seem like the exercise seems to make it worse, but not  consistently so.  He is not significantly more short of breath than  usual.  The latter he told Dr. Dimas Aguas during the hospital admission;  however, during my interview currently now, the patient states that he  has significantly worsening shortness of breath.  He feels very  fatigued.  He has been using nitroglycerin, which has helped his  substernal chest pain and some of his dyspnea.  The patient also gained  9 pounds in weight since his hospital admission.  His blood pressure is  now up to 179/105.  When I questioned the  patient about his drinking  habits, he stated he drinks 4-6 L of fluids a day.  I told him that this  is extremely excessive, particularly in light of his hypertensive heart  disease and likely diastolic dysfunction and that he needs to decrease  his fluid intake significantly.   MEDICATIONS:  There is an extensive list including;  1. Celexa 20 mg a day.  2. Robaxin 750 mg p.o. t.i.d.  3. Nasonex.  4. Lasix 120 mg p.o. q.a.m.  5. Testosterone intramuscularly 2 times a week.  6. Allergy shots.  7. Singulair 10 mg nightly.  8. Pulmicort 180 mcg inhaled b.i.d.  9. Vitamin C.  10.Multivitamin.  11.Fish oil 1000 mg p.o. b.i.d.  12.Vitamin E.  13.L-lysine.  14.Evening primrose.  15.Folic acid.  16.Aspirin 325 mg p.o. daily.  17.Dilaudid p.r.n.  18.Atrovent inhaler.  19.Prednisone Dosepak as needed.  20.Hydrochlorothiazide 50 mg p.r.n.  21.Duragesic patch 100 mcg every 2 days.  22.Vistaril 25 mg 3 tablets daily.  23.Tofranil 25 mg 2 tablets daily.  24.CPAP at night.  25.Flomax  0.4 nightly.  26.Neurontin 3 tablets t.i.d.  27.Potassium chloride 20 mEq 2 tablets p.o. daily.  28.Adderall 10 mg p.o. b.i.d.  29.Midrin p.r.n.  30.Triamcinolone cream p.r.n.  31.ProAir 108 mcg p.r.n.   Please refer to the family history and social history to the prior note.   Past medical history is also previously reviewed and include a long  complicated listed multiple medical problems including a chronic  peripheral neuropathy, a chronic pain disorder with disability going  back to October 2002.   REVIEW OF SYSTEMS:  The patient now reports also significant peripheral  edema, although he has never present with clinical heart failure.  He  reports orthopnea and PND.  He reports no palpitations or syncope.  No  melena, hematochezia, no dysuria, or frequency.  The patient reports  weight gain, dyspnea on exertion, peripheral edema, as well as muscle  weakness and difficulty walking.  He has a  painful peripheral  polyneuropathy as outlined above.   PHYSICAL EXAMINATION:  VITAL SIGNS:  Blood pressure 179/105, heart rate  94, and weight 281 pounds.  GENERAL:  Overweight white male with no apparent distress.  HEENT:  Pupils isocoric.  Conjunctiva clear.  NECK:  Supple.  Normal carotid upstroke and no carotid bruits.  LUNGS:  Clear breath sounds bilaterally.  HEART:  Regular rate and rhythm with normal S1 and S2.  No pathological  murmurs.  ABDOMEN:  Soft, nontender, but quite distended.  EXTREMITIES:  No cyanosis or clubbing.  There is significant peripheral  edema.  Peripheral pulses are intact bilaterally.   PROBLEM LIST:  1. Substernal chest pain.  2. Coronary artery disease.  3. Severe hypertension.  4. Dyspnea (likely multifactorial)  5. Idiopathic peripheral autonomic neuropathy.  6. Chronic pain syndrome.  7. Needle phobia.  8. Needle phobia.  9. Volume overload.   PLAN:  1. The patient likely has diastolic dysfunction, but more importantly      has right heart failure.  He will need a left and right heart      catheterization, to also make sure he does not have significant      pulmonary hypertension.  I suspect in the setting of his sleep      apnea, the patient has pulmonary hypertension, but also has an      element of volume overload secondary to diastolic dysfunction.  2. His chest pain is somewhat atypical, but certainly could be from      worsening coronary artery disease and we will proceed with cardiac      catheterization.  In the interim, I have asked the patient to start      Imdur 30 mg p.o. daily and I have also added enalapril 5 mg p.o.      daily for control of his blood pressure.  Of note, it is also that      the patient does take Cardizem 360 mg p.o. daily, which I did not      listed on the medication list.  3. I have also told the patient that he needs to get back on his fluid      intake, which is likely contributing to his volume  overload.  4. His cardiac catheterization shows no significant worsening coronary      artery disease, as the patient will need aggressive treatment for      hypertensive, heart disease and diastolic dysfunction, as well as      right heart failure symptoms.     Michelle Piper  Raye Sorrow, MD,FACC  Electronically Signed    GED/MedQ  DD: 08/06/2008  DT: 08/07/2008  Job #: [id number]

## 2010-09-24 NOTE — H&P (Signed)
NAMENOUR, [name] NO.:  [id number]   MEDICAL RECORD NO.:  [id number]          PATIENT TYPE:  INP   LOCATION:  3733                         FACILITY:  [facility]   PHYSICIAN:  Christell Faith, MD   DATE OF BIRTH:  Sep 19, 1950   DATE OF ADMISSION:  08/26/2008  DATE OF DISCHARGE:  08/26/2008                              HISTORY & PHYSICAL   CARDIOLOGIST:  Learta Codding, MD, [facility].   PRIMARY CARE PHYSICIAN:  Dr. Selinda Flavin.   CHIEF COMPLAINT:  Chest pain.   HISTORY OF PRESENT ILLNESS:  This is a 60 year old white man, who  underwent a recent drug-eluting stent procedure to his LAD about a week  and half ago by Dr. Juanda Chance.  He did well after that until the past few  days during which time he has had intermittent chest tightness with  exertion and shortness of breath.  He also notes increased swelling of  his left leg relative to his right leg.  He does have chronic bilateral  lower extremity edema and has a history of left leg swelling.  He has  had multiple negative Dopplers in the past.  He feels like all of these  symptoms are new since his stent procedure and he is concerned.  He is  an anxious person at baseline.   PAST MEDICAL HISTORY:  1. Hypertension.  2. Coronary artery disease, status post Xience stent to the mid LAD on      August 15, 2008, diagonal was marginally compromised, but with      preserved flow.  3. Painful polyneuropathy.  4. Chronic pain syndrome and fibromyalgia.  5. Obstructive sleep apnea.  6. Obesity with history of bilateral breast reduction surgery.  7. Kidney stones.  8. BPH.  9. History of appendectomy.   SOCIAL HISTORY:  He lives in [place], [place].  He is disabled.  He  smoked 2 packs a day, but quit 8 years ago.  He is recovering alcoholic,  quit drinking 7 years ago.   FAMILY HISTORY:  Mother is alive and has hypertension.  Father is  deceased and had multiple MIs.   REVIEW OF SYSTEMS:   Positive for weight gain, headache, chest pain,  shortness of breath, edema, urinary frequency, urgency, chronic anxiety,  myalgias, chronic pain, neuropathic pain, and acid reflux.   ALLERGIES:  DEMEROL, OXYCONTIN, and METHADONE.  He has other  intolerances, which are not true allergies.   MEDICINES:  Include but are not limited to;  1. Aspirin 325 mg p.o. daily.  2. Plavix 75 mg p.o. daily.  3. Zocor 40 mg p.o. daily.  4. Cardizem CD 360 mg p.o. daily.  5. Lasix 120 mg p.o. q.a.m.  6. Potassium chloride 20 mEq p.o. daily.  7. Singulair 10 mg p.o. daily.  8. Flomax 0.4 mg p.o. daily.  9. Duragesic patch.  10.Celexa 20 mg p.o. daily.  11.Robaxin 750 mg p.o. t.i.d.  12.Vistaril.  13.Tofranil.  14.Adderall.  15.Dilaudid.   PHYSICAL EXAMINATION:  VITAL SIGNS:  Blood pressure 139/70, respiratory  rate 12, pulse ranging from 78-98, and  saturation 97% on room air.  GENERAL:  This is an anxious overweight man, in no acute distress.  HEENT:  Head is normocephalic and atraumatic.  Pupils are equal, round,  and reactive to light.  Mucous membranes are moist.  Dentition is good.  NECK:  Supple.  Neck veins are flat.  No carotid bruits.  LUNGS:  Clear to auscultation bilaterally without wheezing or rales.  CARDIAC:  Normal rate, regular rhythm, 2/6 systolic ejection murmur at  the base.  No gallop.  2+ dorsalis pedis and radial pulses bilaterally.  Femoral pulses are intact.  Both groins are nontender without bruit or  ecchymosis.  ABDOMEN:  Obese, soft, and nontender.  EXTREMITIES:  Chronic brawny edema of both lower extremities with venous  stasis changes, left leg larger in diameter than the right leg.  NEUROLOGIC:  Normal strength with pain on palpation of his lower  extremities as well as his upper extremities.   DIAGNOSTIC TESTS:  EKG shows sinus rhythm at 76 beats a minute with no  ischemic changes.   LABORATORIES:  White blood cell 9.4, hemoglobin 14.3, and platelets 275.   Sodium 135, potassium 3.5, BUN 10, creatinine 1, and glucose 150, D-  dimer 0.55, BNP 11, CK 144, CK-MB 4.9, and troponin negative.   IMPRESSION:  A 60 year old white man with recent stent procedure, now  with atypical chest pain and anxiety.   PLAN:  1. Watch on telemetry monitor and cycle cardiac enzymes and EKGs.  2. Continue his cardiac medicines including dual antiplatelet therapy.  3. We will check left lower extremity Doppler.  4. CPAP at night.  5. Heparin, DVT prophylaxis.  6. Rest of his workup can be completed by Dr. Andee Lineman in the outpatient      setting.      Christell Faith, MD  Electronically Signed    NDL/MEDQ  D:  08/26/2008  T:  08/26/2008  Job:  [id number]

## 2010-09-24 NOTE — Assessment & Plan Note (Signed)
[facility]                          [facility]ICE NOTE   NAME:[name], [name]                      MRN:          [id number]  DATE:09/05/2008                            DOB:          04-Oct-1950    HISTORY OF PRESENT ILLNESS:  The patient is a 60 year old male with a  history of coronary artery disease status post drug-eluting stent  placement in August 15, 2008.  The patient also has significant anxiety  disorder.  He was recently admitted to [facility] with  noncardiac chest pain.  He was ruled out for myocardial infarction.  The  patient has taken 2 nitroglycerins prior to the event, but there was no  improvement.  He also had swelling in the left leg, which was related to  varicose veins in his left leg.  He previously worked up for DVT.  A d-  dimer was negative.  While in the hospital, his symptom complex entirely  resolved as his anxiety was treated.  The patient now presents for  followup.  He states he has no problems.  He still has panic attacks on  occasion.  He has no palpitations or syncope. He has no shortness of  breath, orthopnea, or PND.  He has had noticed no further swelling in  his left leg.   MEDICATIONS:  The patient has an extensive list of medications and I  will refer to the chart regarding this as this list is tremendously  long.   PHYSICAL EXAMINATION:  VITAL SIGNS:  Blood pressure is 141/78, heart  rate is 90, and weight is 283 pounds.  NECK:  Normal carotid upstroke and no carotid bruits.  LUNGS:  Clear breath sounds bilaterally.  HEART:  Regular rate and rhythm with normal S1 and S2.  No murmur, rubs,  or gallops.  ABDOMEN:  Soft, nontender.  No rebound or guarding, and good bowel  sounds.  EXTREMITIES:  No cyanosis, clubbing, or edema.  GU:  In particular, I examined the patient's groins to make sure he did  not have any lymphadenopathy.  This was negative.  NEURO:  Alert and oriented x3 and  nonfocal.   PROBLEM LIST:  1. Generalized anxiety disorder.  2. Coronary artery disease status post drug-eluting stent placement in      the mid left anterior descending on August 15, 2008.  3. Noncardiac chest pain secondary to anxiety disorder.  4. Hypertension, controlled.  5. Chronic dyspnea, improved.  6. Idiopathic peripheral autonomic neuropathy.  7. Lower extremity edema secondary to venous insufficiency.  8. Obesity.  9. Dyslipidemia.  10.Gastroesophageal reflux disease.   PLAN:  1. The patient does not need any further cardiac workup.  His chest      pain was clearly noncardiac.  2. The patient knows to take sublingual nitroglycerin if he has      recurrent substernal chest pain.  3. I also gave the patient a prescription for clonazepam as he has      still frequent panic attacks and also given recommendations to      purchase the book [employer] Catastrophe Living  by Diamantina Monks, as      this may help him cope with his anxiety disorder.   We will see the patient back in 6 months.     Learta Codding, MD,FACC  Electronically Signed    GED/MedQ  DD: 09/05/2008  DT: 09/05/2008  Job #: [id number]

## 2010-09-24 NOTE — Assessment & Plan Note (Signed)
[facility]                          [facility] NOTE   NAME:[name]              MRN:          [id number]  DATE:12/31/2007                            DOB:          29-Jan-1951    CARDIOLOGIST:  Learta Codding, MD, [facility].   PRIMARY CARE PHYSICIAN:  Selinda Flavin.   REASON FOR VISIT:  A 1-year followup.   HISTORY OF PRESENT ILLNESS:  [name] is a 60 year old male patient  with a history of single-vessel CAD, treated medically, who presents for  1-year followup.  Overall, he is doing well.  He denies any chest  discomfort.  He has noted some dyspnea with exertion.  This is  intermittent.  He has seen his pulmonologist who referred him to an  allergist and he is now getting allergy shots.  He has had a  nonproductive cough with this.  He denies any syncope, near-syncope and  denies any orthopnea or PND.  He does note chronic pedal edema.  This is  worse on the left.  There has been no significant change.  He monitors  his weights fairly well and takes extra Lasix if he notices increased  fluid.   CURRENT MEDICATIONS:  Cardizem 240 mg daily, Celexa 20 mg daily, Robaxin  750 mg 3 times a day, Nasonex, Aspirin 325 mg daily, Prilosec 20 mg  daily, Testosterone cream, Duragesic patch, Vistaril, Tofranil, CPAP,  Flomax, Neurontin 600 mg 3 tablets b.i.d., Potassium 20 mEq 2 tablets  daily, Adderall 10 mg b.i.d., Lasix 120 mg daily, Diprolene cream,  Midrin p.r.n., Valerian root.   PHYSICAL EXAMINATION:  GENERAL:  He is a well-nourished, well-developed  male, in no acute distress.  VITAL SIGN:  Blood pressure is 145/88, pulse 86, and weight 277 pounds.  HEENT:  Normal.  NECK:  Without JVD.  CARDIAC:  Normal S1 and S2.  Regular rate and rhythm.  LUNGS:  Clear to auscultation bilaterally.  ABDOMEN:  Soft and nontender.  EXTREMITIES:  With 1+ edema bilaterally, left greater than the right.  Multiple varicosities are noted on the  left.  VASCULAR:  Without carotid bruits bilaterally.  NEUROLOGIC:  He is alert and oriented x3.  Cranial nerves II-XII grossly  intact.   Electrocardiogram reveals sinus rhythm with a heart rate of 71, normal  axis, and no acute changes.   ASSESSMENT AND PLAN:  1. Dyspnea.  The patient has a history of questionable congestive      heart failure in the setting of preserved LV function.  He is      currently undergoing evaluation for allergies and has seen his      pulmonologist.  His symptoms do not sound consistent with an      anginal equivalent.  We will check an echocardiogram to assess his      left ventricular function.  We will also bring him back in close      followup with Dr. Andee Lineman in the next 3 months.  If he has a change      in his symptoms or if his echocardiogram looks abnormal, we may  need to consider proceeding with cardiac catheterization in the      future to assess his symptoms.  His stress test was abnormal in      2006.  I am not certain that this would be of particular use in      evaluating his symptoms.  He knows to contact us or return sooner      should he notice a change in his symptoms.  2. Single-vessel coronary artery disease.  At his catheterization in      2007, he had 50% left anterior descending stenosis at the arch and      70% ostial stenosis in the diagonal itself and 80-90% stenosis more      distally.  This has been treated medically in the past.  He has not      had recurrence of chest pain.  He continues on aspirin therapy.  As      noted above, we will check an echocardiogram and see him back in      the next 3 months.  3. Dyslipidemia.  Given his history of coronary artery disease, he      would benefit from statin therapy.  His goal LDL is less than or      equal to 70.  We will request his most recent lipid panel from his      primary care physician and reassess this.  4. Hypertension.  This is somewhat elevated today.  We will need  to      continue to monitor this over the time and make adjustments in his      therapy as needed.  5. Lower extremity edema.  This seems to be stable.  He continues on      Lasix.   DISPOSITION:  As noted above, the patient will return for followup with  Dr. Andee Lineman in the next 3 months or sooner p.r.n.      Tereso Newcomer, PA-C  Electronically Signed      Luis Abed, MD, [facility]  Electronically Signed   SW/MedQ  DD: 12/31/2007  DT: 01/01/2008  Job #: [id number]   cc:   Selinda Flavin

## 2010-09-24 NOTE — Cardiovascular Report (Signed)
[name], [name] NO.:  [id number]   MEDICAL RECORD NO.:  [id number]          PATIENT TYPE:  INP   LOCATION:  2501                         FACILITY:  [facility]   PHYSICIAN:  Bruce R. Juanda Chance, MD, FACCDATE OF BIRTH:  10-23-1950   DATE OF PROCEDURE:  08/15/2008  DATE OF DISCHARGE:                            CARDIAC CATHETERIZATION   CLINICAL HISTORY:  [name] is 61 years old and has hypertension and  obstructive sleep apnea and chest pain.  He was recently studied by Dr.  Antoine Poche in the outpatient laboratory and found to have a tight lesion  in the mid LAD which was a bifurcation lesion involving the diagonal  branch.  We brought him today for intervention.   PROCEDURE:  The procedure was performed by the left femoral artery using  an arterial sheath and a 7-French Q-4 guiding catheter with side holes.  We chose a 7-French catheter because it was a bifurcation lesion and we  felt we may need to dilate through the stent into the side branch.  We  passed a Prowater wire down the LAD and diagonal branch without  difficulty.  The patient was given Angiomax bolus and infusion and was  given 4 chewable aspirin and also received a total of 300 mg of Plavix  today after being started on this earlier.  We first predilated the LAD  lesion with a 2.5 x 15-mm apex balloon performing 1 inflation up to 10  atmospheres for 30 seconds.  We then deployed a 3.0 x 18-mm Xience stent  crossing the second diagonal branch.  This was deployed in the lesion in  the mid LAD.  We deployed this with 1 inflation at 14 atmospheres for 30  seconds.  We then removed the wire from the diagonal branch and  postdilated the stent with a 3.5 x 15-mm Ostrander Voyager performing 2  inflations up to 18 atmospheres for 30 seconds.  We did not  significantly compromise the diagonal branch, and there was less than  50% residual narrowing with TIMI III flow.  Final diagnostics were then  performed through the  guiding catheter.  The patient tolerated the  procedure well and left laboratory in satisfactory condition.   RESULTS:  Initially, the stenosis in the mid LAD was estimated at 90%  and occurred just after the diagonal branch.  There was about 40%  narrowing at the ostium of the diagonal branch.  Following stenting, the  LAD stenosis improved from 90% to 0%.  The diagonal branch maintained  TIMI III flow and the stenosis was less than 50% at the ostium where was  jailed by the stent.   CONCLUSION:  Successful PCI of the lesion in the mid LAD using a Xience  drug-eluting stent with improvement in center narrowing from 90% to 0%.   DISPOSITION:  The patient returned to post angio for further  observation.  He is to remain on long-term Plavix.      Bruce Elvera Lennox Juanda Chance, MD, [facility]  Electronically Signed    BRB/MEDQ  D:  08/15/2008  T:  08/16/2008  Job:  [id number]  cc:   Learta Codding, MD,FACC  Rollene Rotunda, MD, [facility]  Cardiopulmonary Lab  Selinda Flavin

## 2010-09-24 NOTE — Cardiovascular Report (Signed)
NAMERAMIL, EDGINGTON NO.:  [id number]   MEDICAL RECORD NO.:  [id number]          PATIENT TYPE:  OIB   LOCATION:  1965                         FACILITY:  [facility]   PHYSICIAN:  Rollene Rotunda, MD, FACCDATE OF BIRTH:  01/25/1951   DATE OF PROCEDURE:  08/10/2008  DATE OF DISCHARGE:  08/10/2008                            CARDIAC CATHETERIZATION   PROCEDURE:  Left and right heart catheterization/coronary arteriography.   INDICATIONS:  Evaluate the patient with coronary artery disease,  dyspnea.   PROCEDURE NOTE:  Left heart catheterization was performed via right  femoral artery.  Right heart catheterization was performed via right  femoral vein.  Both were cannulated using anterior wall puncture.  A #4-  [place] arterial sheath and #7-French venous sheath were inserted via  modified Seldinger technique.  Preformed Judkins, pigtail catheter, and  a Swan-Ganz catheter were utilized.  The patient tolerated the procedure  well and left lab in stable condition.   RESULTS:  Hemodynamics:  LV 130/23, AO 130/79, RA mean 14, RV 34/12, PA  mean 24, pulmonary capillary wedge pressure mean 15, cardiac  output/cardiac index (Fick) 6.5/2.7.   Coronaries:  The left main was normal.  The LAD had proximal 30%  stenosis at the first diagonal.  There was mid 90% stenosis at the  second diagonal.  The first diagonal was large to moderate size vessel  with ostial 80% stenosis.  Second diagonal had an ostial 25% stenosis.  The circumflex in the AV groove had proximal 25% tandem lesions.  It  essentially terminated as a large mid obtuse marginal.  There was a  ramus intermediate which was large with luminal irregularities.  Right  coronary artery was large and dominant with mid luminal irregularities.  The PDA was large and normal.   CONCLUSION:  Mildly increased pulmonary artery pressures.  Normal LV  function.  Severe LAD stenosis.  The patient will have elective PCI of  the  LAD.      Rollene Rotunda, MD, [facility]  Electronically Signed     Rollene Rotunda, MD, [facility]  Electronically Signed    JH/MEDQ  D:  10/12/2008  T:  10/12/2008  Job:  [id number]

## 2010-09-24 NOTE — Discharge Summary (Signed)
[name], HARA NO.:  [id number]   MEDICAL RECORD NO.:  [id number]          PATIENT TYPE:  INP   LOCATION:  2501                         FACILITY:  [facility]   PHYSICIAN:  Bruce R. Juanda Chance, MD, FACCDATE OF BIRTH:  05/07/1951   DATE OF ADMISSION:  08/15/2008  DATE OF DISCHARGE:  08/16/2008                               DISCHARGE SUMMARY   PRIMARY CARDIOLOGIST:  Learta Codding, MD, [facility]   PRIMARY CARE Chieko Neises:  Selinda Flavin.   DISCHARGE DIAGNOSIS:  Unstable angina.   SECONDARY DIAGNOSES:  1. Coronary artery disease, status post successful percutaneous      coronary intervention and stenting of the mid left anterior      descending with placement of a 3.0 x 18-mm Xience V drug-eluting      stent.  2. Hypertension.  3. Chronic dyspnea.  4. Idiopathic peripheral autonomic neuropathy.  5. Chronic pain syndrome.  6. History of needle phobia.  7. Obesity.  8. Hyperlipidemia.  9. Gastroesophageal reflux disease.   ALLERGIES:  LIDOCAINE and PROCAINE.   PROCEDURE:  Successful percutaneous coronary intervention and stenting  of the mid left anterior descending with placement of a 3.0 x 18-mm  Xience drug-eluting stent.   HISTORY OF PRESENT ILLNESS:  A 60 year old married Caucasian male with  the above problem list.  He was recently hospitalized at the [facility] on July 14, 2008, for complaints of chest discomfort.  He  subsequently followed up with Dr. Andee Lineman and during that visit, the  patient complained of progressive exertional dyspnea and angina.  We  subsequently, set up for right and left heart cardiac catheterization  which took place on August 10, 2008, revealing 90% stenosis of the mid LAD  at the take off of the second diagonal.  He otherwise had nonobstructive  disease.  Right heart catheterization was also performed revealing  mildly elevated right heart pressures.  The patient was set up to come  back for LAD intervention.   HOSPITAL  COURSE:  The patient presented at [facility] on April  6, underwent successful PCI and stenting of the LAD with placement of  3.0 x 18 mm Xience V drug-eluting stent.  The patient tolerated  procedure well.  On post procedures, he has been ambulating without  recurrent symptoms or limitations.  We have reinitiated simvastatin  therapy, and I have continued his aspirin and Plavix.  [name] will  be discharged home today in good condition.   DISCHARGE LABORATORY DATA:  Hemoglobin 13.5, hematocrit 39.2, WBC 5.9,  and platelets 243.  Sodium 138, potassium 4.1, chloride 99, CO2 32, BUN  8, creatinine 0.88, glucose 138, and calcium 8.6.   DISPOSITION:  The patient will be discharged home today in good  condition.   FOLLOWUP PLANS AND APPOINTMENTS:  We will arrange for followup with Dr.  Andee Lineman in approximately 2 weeks.   DISCHARGE MEDICATIONS:  1. Celexa 20 mg daily.  2. Robaxin 750 mg t.i.d.  3. Nasonex 2 sprays each nostrils daily.  4. Lasix 120 mg daily and p.r.n.  5. Testosterone 0.25 mL  injected intramuscularly 3 times a week.  6. Allergy injection 2 times a week.  7. Singulair 10 mg nightly.  8. Pulmicort 180 mcg b.i.d.  9. Vitamin C 250 mg b.i.d.  10.Multivitamin daily.  11.Fish oil 1000 mg b.i.d.  12.Lysine 500 mg b.i.d.  13.Evening Primrose 500 mg b.i.d.  14.Folic acid 400 mg daily.  15.Aspirin 325 mg daily.  16.Cardizem 360 mg daily.  17.Prilosec 20 mg daily.  18.Duragesic patch 100 mcg q.48 h.  19.Duragesic patch 50 mcg q.48 h.  20.Zestril 25 mg 3 tablets daily.  21.Tofranil 25 mg 2 tablets daily.  22.Flomax 0.4 mg nightly.  23.Neurontin 600 mg 3 tablets t.i.d.  24.Potassium 20 mEq 2 tablets daily.  25.Adderall 10 mg b.i.d.  26.Diprolene cream p.r.n.  27.Midrin p.r.n.  28.Valerian Root 400 mg 2-4 tablets daily p.r.n.  29.MiraLax p.r.n.  30.Triamcinolone cream p.r.n.  31.Dilaudid 4 mg 4 tablets p.r.n.  32.Atrovent 0.06% 2 puffs nasal spray p.r.n.   33.Prednisone mouth rinse p.r.n.  34.Prednisone Dosepak p.r.n.  35.Hydrochlorothiazide 50 mg p.r.n.  36.Enalapril 5 mg daily.  37.Mucinex 2 tablets daily and p.r.n.  38.Plavix 75 mg daily.  39.Nitrostat 0.4 mg sublingual p.r.n. chest pain.  40.Simvastatin 40 mg nightly.   OUTSTANDING LABORATORY STUDIES:  None.   DURATION OF DISCHARGE/ENCOUNTER:  Forty minutes including physician  time.      Nicolasa Ducking, ANP      Bruce R. Juanda Chance, MD, [facility]  Electronically Signed    CB/MEDQ  D:  08/16/2008  T:  08/16/2008  Job:  [id number]   cc:   Selinda Flavin

## 2010-09-24 NOTE — Assessment & Plan Note (Signed)
[facility]                          [facility] NOTE   NAME:Strothers, BRISON FIUMARA              MRN:          [id number]  DATE:10/26/2006                            DOB:          July 20, 1950    HISTORY OF PRESENT ILLNESS:  The patient is a 60 year old male with a  history of moderate coronary artery disease.  The patient during last  visit reported chest pain.  He was placed on Imdur; however, it was not  felt that he had unstable angina.  He also had Dopplers done throughout  peripheral vascular disease.  The studies were essentially within normal  limits.  He now presents for followup.  He has got good blood pressure  control with a blood pressure of 135/82, has essentially no symptoms.   MEDICATIONS:  Listed in the chart and also reported in the last note by  Gene Serpe on Sep 10, 2006.  There has now been the addition of Imdur 30  mg p.o. q.day, as well as Zocor 40 mg p.o. q.day.   PHYSICAL EXAMINATION:  VITAL SIGNS:  Blood pressure is 135/82, heart  rate 80 beats per minutes.  NECK:  Normal carotid upstrokes, no carotid bruits.  LUNGS:  Clear breath sounds bilaterally.  HEART:  Regular rate and rhythm.  Normal S1, S2.  No murmurs, rubs or  gallops.  ABDOMEN:  Soft, nontender.  No rebound or guarding.  Good bowel sounds.  EXTREMITIES:  No cyanosis, clubbing or edema.   PROBLEMS:  1. Single-vessel coronary artery disease.  2. Mixed dyslipidemia.  3. Hypertension.  4. Morbid obesity.  5. Chronic lower extremity edema, secondary to venous insufficiency.  6. Obstructive sleep apnea on CPAP.  7. Exertional dyspnea, stable.   PLAN:  1. The patient will have a lipid panel done and LFTs with Dr. Dimas Aguas      in the next couple of weeks when he has his physical exam.  2. No further adjustments have been made in Imdur.  He has stable      symptoms and appears to be doing quite well.  3. We reviewed Doppler studies which show no evidence  of peripheral      vascular disease.  4. The patient can follow up in one year.     Learta Codding, MD,FACC  Electronically Signed   GED/MedQ  DD: 10/26/2006  DT: 10/26/2006  Job #: [id number]

## 2010-09-27 NOTE — Cardiovascular Report (Signed)
[name], [name] NO.:  [id number]   MEDICAL RECORD NO.:  [id number]          PATIENT TYPE:  OIB   LOCATION:  1965                         FACILITY:  [facility]   PHYSICIAN:  Arturo Morton. Riley Kill, M.D. [facility] OF BIRTH:  09/05/1950   DATE OF PROCEDURE:  06/13/2005  DATE OF DISCHARGE:                              CARDIAC CATHETERIZATION   INDICATIONS:  The patient is 60 year old gentleman with dyspnea, edema, and  an abnormal Cardiolite.   PROCEDURE:  1.  Right and left heart catheterization.  2.  Selective coronary arteriography.  3.  Selective left ventriculography.   DESCRIPTION OF PROCEDURE:  The patient was brought to the catheterization  laboratory following informed consent, prepped and draped in usual fashion.  Through an anterior puncture the femoral vein was entered and a 7-French  sheath was placed. A 7.5 French thermodilution Swan-Ganz catheter was then  passed into the superior vena cava where saturation was obtained to rule out  left-to-right shunt. The Swan-Ganz serial pressures were measured using a  balloon-tipped catheter throughout the right heart chambers and the Swan was  placed in the pulmonary capillary wedge position. Following this, the  catheter was pulled back into the PA position and PA saturation obtained.  Thermodilution cardiac outputs were then performed. The right heart catheter  was subsequently removed. Following this, the right femoral artery was  easily entered and a 4-French sheath placed. Central aortic and left  ventricular pressures were measured with pigtail. Ventriculography was then  performed in the RAO projection. Arterial saturation was also obtained.  Coronary arteriography was obtained using standard Judkins catheters.  Overall, the patient tolerated procedure without complication was taken to  the holding area in satisfactory clinical condition.   HEMODYNAMIC DATA.:  1.  Right atrium 14.  2.  Right ventricle  35/16.  3.  Pulmonary artery 35/89, mean 25.  4.  Pulmonary capillary wedge 18.  5.  LV 111/99.  6.  Aortic 116/70.  7.  No gradient or pullback across aortic valve 30.  8.  Fick cardiac output 6.1 liters per minute.  9.  Fick cardiac index 2.6 liters per minute per meter squared.  10. Thermodilution cardiac output 7.5 liters per minute.  11. Thermodilution cardiac index 3.19 liters per minute per meter squared.  12. Superior vena cava saturation 68%.  13. Aortic saturation 93%.   ANGIOGRAPHIC DATA.:  1.  Ventriculography was done in the RAO projection. Overall systolic      function does appear to be preserved although it was a low-volume      contrast injection due to the 4-French catheters.  2.  The left main is free of critical disease.  3.  The LAD courses to the apex. At the origin of first diagonal is an      eccentric area of plaquing of about 50%. The diagonal itself is involved      and has about 70% ostial narrowing. It [place] at least a moderate size      vessel. Distally at the takeoff of the second diagonal and encompassing      the  origin of the second diagonal was about an 80-90% eccentric area of      stenosis. There was a moderate size vessel distally.  4.  The circumflex provides two marginal branches and other than minor      luminal irregularity is free of critical disease.  5.  The right coronary artery has minor luminal irregularity but no critical      disease.   CONCLUSION:  1.  Preserved overall left ventricular function.  2.  Mild elevation of pulmonary artery pressures without evidence of left-to-      right shunt and borderline wedge.  3.  Moderate stenosis of the proximal mid left anterior descending artery      and moderately high-grade stenosis of the mid left anterior descending      artery encompassing the origins of both first and second diagonal      branches.   DISPOSITION:  The patient will follow-up Dr. Andee Lineman on February 6. Further  decisions  will be made regarding either medical and/or percutaneous  intervention. The lesions are modestly complex.      Arturo Morton. Riley Kill, M.D. [facility]  Electronically Signed     TDS/MEDQ  D:  06/13/2005  T:  06/13/2005  Job:  [id number]   cc:   Learta Codding, M.D. [facility]  [address]  [address]  [place]  [place] [address]   Selinda Flavin  Fax: [phone]   [facility]

## 2010-09-27 NOTE — Assessment & Plan Note (Signed)
[facility]                          [facility] NOTE   NAME:[name], [name]              MRN:          [id number]  DATE:09/10/2006                            DOB:          10-13-1950    PRIMARY CARDIOLOGIST:  Dr. Lewayne Bunting.   REASON FOR VISIT:  Annual followup.   [name] is a 60 year old male with known coronary artery disease by  previous cardiac catheterization in February of 2007, who now returns to  the clinic for annual followup.   Since last seen in the clinic, the patient in general feels that he is  doing well, although he does tell me that he has been having somewhat  more progressive chest pain associated with exertion.  He tries to  exercise 20-30 minutes a day with a variety of exercises, but this does  not appear to include regular use of a treadmill.  His symptoms  typically are relieved with rest, and for example, he also has chest  discomfort when climbing up a flight of stairs with some associated  significant dyspnea.  He also cites symptoms suggestive of intermittent  claudication.   The patient has severe financial constraints and is concerned about the  cost of medications, given that he is on quite a number of them.  Of  note, however, he states that he has never been on a lipid lowering  agent.  Recent lipid profile notable for a total cholesterol 164,  triglyceride 217, HDL 29, and LDL 92.   The patient also refers to a stress test last year, apparently negative,  but for which we are unable to find any supporting records.   Electrocardiogram today reveals NSR at 88 BPM with left axis deviation  and no ischemic changes.   CURRENT MEDICATIONS:  1. Full-dose aspirin.  2. Prilosec.  3. Toprol XL 100 daily.  4. Testosterone cream 50 mg as directed.  5. Duragesic patch 50/100 mcg/hour alternating every 48 hours.  6. Lexapro 10 daily.  7. Vistaril.  8. Tofranil.  9. Flomax.  10.KCl 40 daily.  11.Lasix 120 q. a.m. 40 q. p.m.  12.Adderall 10 b.i.d.  13.Neurontin 1800 mg b.i.d.  14.CPAP.   PHYSICAL EXAM:  Blood pressure 147/96, pulse 93, regular.  Weight 288  (up 12 pounds).  GENERAL:  A 60 year old male, obese, sitting upright in no distress.  HEENT:  Normocephalic, atraumatic.  NECK:  Palpable carotid pulses without bruits.  Unable to assess JVD  secondary to neck girth.  LUNGS:  Clear to auscultation in all fields.  HEART:  Regular rate and rhythm.  S1, S2.  No significant murmurs.  ABDOMEN:  Protuberant.  Nontender.  EXTREMITIES:  With 2-3+ bilateral nonpitting lower extremity edema with  venous varicosity; diminished pulses.  NEURO:  No focal deficits.   IMPRESSION:  1. Single-vessel coronary artery disease.      a.     Moderate bifurcational disease involving the left anterior       descending and first and second diagonal branches, by cardiac       catheterization February of 2007.  Medical therapy recommended.  b.     Preserved left ventricular function.  2. Mixed dyslipidemia.  3. Hypertension.  4. Morbid obesity.  5. Chronic lower extremity edema.      a.     Secondary to venous insufficiency.  6. Obstructive sleep apnea/on continuous positive airway pressure.  7. Exertional dyspnea.   PLAN:  1. Initiate cholesterol-lowering therapy with Zocor 40 mg daily. Check      followup fasting lipids/liver profile in 12 weeks.  2. Add Imdur 30 mg daily for antianginal benefit.  I will also      consider addition of an ACE inhibitor pending a clinic followup and      review of his blood pressure.  3. Schedule lower extremity arterial Dopplers to exclude significant      peripheral vascular disease.  4. Schedule return clinic followup with myself and Dr. Andee Lineman in 4      weeks for early followup of      symptoms and further recommendations.  If his blood pressure is      stable at this time, then we will start lisinopril 5 daily.      Gene Serpe,  PA-C       Learta Codding, MD,FACC    GS/MedQ  DD: 09/10/2006  DT: 09/10/2006  Job #: [id number]   cc:   Selinda Flavin

## 2011-02-26 ENCOUNTER — Other Ambulatory Visit: Payer: Self-pay | Admitting: Neurology

## 2011-02-26 DIAGNOSIS — M542 Cervicalgia: Secondary | ICD-10-CM

## 2011-03-06 ENCOUNTER — Ambulatory Visit (HOSPITAL_COMMUNITY)
Admission: RE | Admit: 2011-03-06 | Discharge: 2011-03-06 | Disposition: A | Discharge status: Home / Self Care | Payer: Medicare Other | Source: Ambulatory Visit | Attending: Neurology | Admitting: Neurology

## 2011-03-06 ENCOUNTER — Other Ambulatory Visit: Payer: Self-pay | Admitting: Neurology

## 2011-03-06 DIAGNOSIS — M542 Cervicalgia: Secondary | ICD-10-CM | POA: Insufficient documentation

## 2011-03-06 DIAGNOSIS — M549 Dorsalgia, unspecified: Secondary | ICD-10-CM

## 2011-03-06 DIAGNOSIS — M47812 Spondylosis without myelopathy or radiculopathy, cervical region: Secondary | ICD-10-CM | POA: Insufficient documentation

## 2011-03-06 DIAGNOSIS — M5124 Other intervertebral disc displacement, thoracic region: Secondary | ICD-10-CM | POA: Insufficient documentation

## 2011-03-06 DIAGNOSIS — M79609 Pain in unspecified limb: Secondary | ICD-10-CM | POA: Insufficient documentation

## 2011-04-30 ENCOUNTER — Other Ambulatory Visit: Payer: Self-pay | Admitting: Urology

## 2011-04-30 ENCOUNTER — Telehealth: Payer: Self-pay | Admitting: Cardiology

## 2011-04-30 NOTE — Telephone Encounter (Signed)
Original Reminder mailed 10/11/10-srs °mailed reminder again 11/15-srs °called 12/19 ° °

## 2011-05-02 ENCOUNTER — Encounter (HOSPITAL_COMMUNITY): Payer: Self-pay

## 2011-05-08 ENCOUNTER — Inpatient Hospital Stay (HOSPITAL_COMMUNITY): Admission: RE | Admit: 2011-05-08 | Payer: Medicare Other | Source: Ambulatory Visit

## 2011-05-09 ENCOUNTER — Ambulatory Visit (HOSPITAL_COMMUNITY)
Admission: RE | Admit: 2011-05-09 | Discharge: 2011-05-09 | Disposition: A | Discharge status: Home / Self Care | Payer: Medicare Other | Source: Ambulatory Visit | Attending: Urology | Admitting: Urology

## 2011-05-09 ENCOUNTER — Encounter (HOSPITAL_COMMUNITY)
Admission: RE | Admit: 2011-05-09 | Discharge: 2011-05-09 | Disposition: A | Discharge status: Home / Self Care | Payer: Medicare Other | Source: Ambulatory Visit | Attending: Urology | Admitting: Urology

## 2011-05-09 ENCOUNTER — Encounter (HOSPITAL_COMMUNITY): Payer: Self-pay

## 2011-05-09 DIAGNOSIS — Z01812 Encounter for preprocedural laboratory examination: Secondary | ICD-10-CM | POA: Insufficient documentation

## 2011-05-09 DIAGNOSIS — Z01818 Encounter for other preprocedural examination: Secondary | ICD-10-CM | POA: Insufficient documentation

## 2011-05-09 HISTORY — DX: Dry eye syndrome of bilateral lacrimal glands: H04.123

## 2011-05-09 HISTORY — DX: Sjogren syndrome, unspecified: M35.00

## 2011-05-09 HISTORY — DX: Cough: R05

## 2011-05-09 HISTORY — DX: Anxiety disorder, unspecified: F41.9

## 2011-05-09 HISTORY — DX: Headache: R51

## 2011-05-09 HISTORY — DX: Fibromyalgia: M79.7

## 2011-05-09 HISTORY — DX: Personal history of other specified conditions: Z87.898

## 2011-05-09 HISTORY — DX: Disorder of penis, unspecified: N48.9

## 2011-05-09 HISTORY — DX: Sleep apnea, unspecified: G47.30

## 2011-05-09 HISTORY — DX: Polyneuropathy, unspecified: G62.9

## 2011-05-09 HISTORY — DX: Personal history of urinary calculi: Z87.442

## 2011-05-09 HISTORY — DX: Gastro-esophageal reflux disease without esophagitis: K21.9

## 2011-05-09 HISTORY — DX: Chronic cough: R05.3

## 2011-05-09 HISTORY — DX: Atherosclerotic heart disease of native coronary artery without angina pectoris: I25.10

## 2011-05-09 HISTORY — DX: Edema, unspecified: R60.9

## 2011-05-09 LAB — BASIC METABOLIC PANEL
BUN: 9 mg/dL (ref 6–23)
Creatinine, Ser: 0.8 mg/dL (ref 0.50–1.35)
GFR calc Af Amer: >90 mL/min (ref >90)
GFR calc non Af Amer: >90 mL/min (ref >90)
Glucose, Bld: 129 mg/dL — ABNORMAL HIGH (ref 70–99)

## 2011-05-09 NOTE — Patient Instructions (Addendum)
20 [name]  05/09/2011   Your procedure is scheduled on:  05/12/11  Report to [facility] at 5:30 AM.  Call this number if you have problems the morning of surgery: [phone]   Remember:   Do not eat food:After Midnight.  May have clear liquids:until Midnight .  Clear liquids include soda, tea, black coffee, apple or grape juice, broth.  Take these medicines the morning of surgery with A SIP OF WATER: PRILOSEC / CARDIZEM / NEURONTIN /             DILAUDID/ATROVENT/ROBAXIN --IF NEEDED ONLY   Do not wear jewelry, make-up or nail polish.  Do not wear lotions, powders, or perfumes. You may wear deodorant.  Do not shave 48 hours prior to surgery.  Do not bring valuables to the hospital.  Contacts, dentures or bridgework may not be worn into surgery.  Leave suitcase in the car. After surgery it may be brought to your room.  For patients admitted to the hospital, checkout time is 11:00 AM the day of discharge.   Patients discharged the day of surgery will not be allowed to drive home.  Name and phone number of your driver:   Special Instructions: CHG Shower Use Special Wash: 1/2 bottle night before surgery and 1/2 bottle morning of surgery.   Please read over the following fact sheets that you were given: MRSA Information             BRING C-PAP TO HOSPITAL

## 2011-05-10 NOTE — H&P (Signed)
History of Present Illness   [name] (patient prefers to go by [name]) presents today as a referral from Dr. Selinda [name] for further assessment of some chronic penile lesions. [name] is currently 60 years of age. In the past he has been under the care of Dr. Rito [name] in [place]. He has had some recurrent nephrolithiasis as well multiple meatotomies and a circumcision into adulthood. He was always felt to have excess skin taken off at the time of his circumcision and has had chronic problems with tearing and irritation of his penile skin. Several months ago he noticed a lesion/sore near the corona sulcus of his penis. This felt irritated to him. This area seems to have increased in size. He is here for assessment of that. 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 He has at least mild to moderate obstructive and irritative voiding symptoms. He has a number of other medical issues and brings with him a fairly good summary sheet of his current medications, problems and current physicians, which was all reviewed. He then tells me that he has had quite a significant insensitivity to local anesthesia and either has not been able to successfully achieve a degree of numbness or it takes a considerable amount of medication to accomplish that.       Past Medical History Problems  1. History of  Fibromyalgia 729.1 2. History of  Hearing Loss 389.9 3. History of  Heartburn 787.1 4. History of  Hypertension 401.9 5. History of  Peripheral Neuropathy 356.9 6. History of  Sjogren's Syndrome 710.2  Surgical History Problems  1. History of  Appendectomy 2. History of  Breast Surgery Mastectomy V45.71 3. History of  Elective Circumcision V50.2 4. History of  Hand Surgery 5. History of  Heart Surgery 6. History of  Hernia Repair 7. History of  Oral Surgery 8. History of  Tonsillectomy 9. History of  Urethral Meatotomy  Current Meds 1. Adult Aspirin Low  Strength 81 MG Oral Tablet Dispersible; Therapy: (Recorded:18Dec2012) to 2. Atrovent HFA AERS; Therapy: (Recorded:18Dec2012) to 3. Benadryl CAPS; Therapy: (Recorded:18Dec2012) to 4. Cardizem CD 360 MG Oral Capsule Extended Release 24 Hour; Therapy:  (Recorded:18Dec2012) to 5. CeleXA 20 MG Oral Tablet; Therapy: (Recorded:18Dec2012) to 6. ClonazePAM 0.5 MG Oral Tablet; Therapy: (Recorded:18Dec2012) to 7. Cod Liver Oil CAPS; Therapy: (Recorded:18Dec2012) to 8. CPAP; Therapy: (Recorded:18Dec2012) to 9. Depo-Testosterone 200 MG/ML Intramuscular Oil; Therapy: (Recorded:18Dec2012) to 10. Dextroamphetamine Sulfate CR 10 MG CPCR; Therapy: (Recorded:18Dec2012) to 11. Dilaudid 4 MG Oral Tablet; Therapy: (Recorded:18Dec2012) to 12. Duragesic-100 100 MCG/HR Transdermal Patch 72 Hour; Therapy: (Recorded:18Dec2012) to 13. Fish Oil CAPS; Therapy: (Recorded:18Dec2012) to 14. GuaiFENesin 400 MG Oral Tablet; Therapy: (Recorded:18Dec2012) to 15. Hydrochlorothiazide 50 MG Oral Tablet; Therapy: (Recorded:18Dec2012) to 16. Lasix 40 MG Oral Tablet; Therapy: (Recorded:18Dec2012) to 17. L-Lysine TABS; Therapy: (Recorded:18Dec2012) to 18. MiraLax Oral Packet; Therapy: (Recorded:18Dec2012) to 19. Neurontin 600 MG Oral Tablet; Therapy: (Recorded:18Dec2012) to 20. Nitrostat 0.4 MG Sublingual Tablet Sublingual; Therapy: (Recorded:18Dec2012) to 21. Potassium Chloride ER 10 MEQ Oral Capsule Extended Release; Therapy:   (Recorded:18Dec2012) to 22. PriLOSEC 40 MG Oral Capsule Delayed Release; Therapy: (Recorded:18Dec2012) to 23. Robaxin TABS; Therapy: (Recorded:18Dec2012) to 24. Vitamin C TABS; Therapy: (Recorded:18Dec2012) to 25. Vitamin D TABS; Therapy: (Recorded:18Dec2012) to  Allergies Medication  1. Ambien TABS 2. Cymbalta CPEP 3. Demerol TABS 4. Lidocaine OINT 5. Lyrica CAPS 6. Methadone HCl TABS 7. Novocain SOLN 8. OxyCONTIN TB12 9. Xylocaine SOLN  Family History Problems  1.  Family history of  Death  In The Family Father 54yrs 2. Maternal history of  Death In The Family Mother 3. Family history of  Family Health Status Number Of Children 1 son and 1 daughter 4. Paternal history of  Heart Disease V17.49 5. Maternal history of  Heart Disease V17.49  Social History Problems    Caffeine Use 1 1/2 Liters qd   Marital History - Currently Married   Occupation: disabled   Recovering Alcoholic   Tobacco Use 305.1 24yrs smoker and 49yrs non smoker.  Review of Systems Genitourinary, constitutional, skin, eye, otolaryngeal, hematologic/lymphatic, cardiovascular, pulmonary, endocrine, musculoskeletal, gastrointestinal, neurological and psychiatric system(s) were reviewed and pertinent findings if present are noted.  Genitourinary: urinary frequency, erectile dysfunction, penile pain, penile mass and penile lesion.  Gastrointestinal: nausea, abdominal pain, diarrhea and constipation.  Integumentary: skin rash/lesion and pruritus.  Hematologic/Lymphatic: swollen glands.  Cardiovascular: leg swelling.  Respiratory: cough.  Musculoskeletal: back pain and joint pain.  Neurological: dizziness.  Psychiatric: anxiety and depression.    Vitals Vital Signs [Data Includes: Last 1 Day]  18Dec2012 03:17PM  BMI Calculated: 39.1 BSA Calculated: 2.32 Height: 5 ft 9 in Weight: 264 lb  Blood Pressure: 156 / 87 Temperature: 97.2 F Heart Rate: 78  Physical Exam Constitutional: Well nourished and well developed . No acute distress.  ENT:. The ears and nose are normal in appearance.  Neck: The appearance of the neck is normal and no neck mass is present.  Pulmonary: No respiratory distress and normal respiratory rhythm and effort.  Cardiovascular: Heart rate and rhythm are normal . No peripheral edema.  Abdomen: The abdomen is soft and nontender. No masses are palpated. No CVA tenderness. No hernias are palpable. No hepatosplenomegaly noted.  Rectal: Rectal exam demonstrates normal sphincter  tone, no tenderness and no masses. Estimated prostate size is 1+. Normal rectal tone, no rectal masses, prostate is smooth, symmetric and non-tender. The prostate has no nodularity and is not tender. The left seminal vesicle is nonpalpable. The right seminal vesicle is nonpalpable. The perineum is normal on inspection.  Genitourinary: The penis is circumcised. The scrotum is normal in appearance. The right epididymis is palpably normal. The left epididymis is palpably normal. The right testis is palpably normal. The left testis is normal. See dictated description.  Skin: Normal skin turgor, no visible rash and no visible skin lesions.  Neuro/Psych:. Mood and affect are appropriate.    Assessment Assessed  1. Penile Lesion Single 2. Benign Prostatic Hypertrophy With Urinary Obstruction 600.01  Discussion/Summary   [name] has an area of clear abnormality, really an area just on the glans penis and what would be the mucosal collar. The skin itself is thickened and somewhat erythematous. This is really nonspecific and certainly can be seen with some chronic inflammatory changes. It sounds as though he did have a circumcision later in life and then has had problems with chronic skin irritation in that area. Obviously the concern would be that this could represent some squamous cell carcinoma, especially in situ. I think given the appearance, it would be appropriate for some small biopsies to be performed in several different areas. I would recommend little wedge biopsies of these to get pathologic analysis. I would propose this be done as an outpatient with some IV sedation in an attempt at local anesthesia. Obviously, treatment would really depend completely on pathologic findings. He would like to try to get this done before the start of the new year and we will see  if we can accomplish that for him.   CC: Devin Flavin, MD

## 2011-05-12 ENCOUNTER — Encounter (HOSPITAL_COMMUNITY): Payer: Self-pay | Admitting: Certified Registered Nurse Anesthetist

## 2011-05-12 ENCOUNTER — Ambulatory Visit (HOSPITAL_COMMUNITY): Payer: Medicare Other | Admitting: Certified Registered Nurse Anesthetist

## 2011-05-12 ENCOUNTER — Other Ambulatory Visit: Payer: Self-pay | Admitting: Urology

## 2011-05-12 ENCOUNTER — Ambulatory Visit (HOSPITAL_COMMUNITY)
Admission: RE | Admit: 2011-05-12 | Discharge: 2011-05-12 | Disposition: A | Discharge status: Home / Self Care | Payer: Medicare Other | Source: Ambulatory Visit | Attending: Urology | Admitting: Urology

## 2011-05-12 ENCOUNTER — Encounter (HOSPITAL_COMMUNITY): Payer: Self-pay | Admitting: *Deleted

## 2011-05-12 ENCOUNTER — Encounter (HOSPITAL_COMMUNITY)
Admission: RE | Disposition: A | Discharge status: Home / Self Care | Payer: Self-pay | Source: Ambulatory Visit | Attending: Urology

## 2011-05-12 DIAGNOSIS — N401 Enlarged prostate with lower urinary tract symptoms: Secondary | ICD-10-CM | POA: Insufficient documentation

## 2011-05-12 DIAGNOSIS — Z79899 Other long term (current) drug therapy: Secondary | ICD-10-CM | POA: Insufficient documentation

## 2011-05-12 DIAGNOSIS — R12 Heartburn: Secondary | ICD-10-CM | POA: Insufficient documentation

## 2011-05-12 DIAGNOSIS — N35919 Unspecified urethral stricture, male, unspecified site: Secondary | ICD-10-CM | POA: Insufficient documentation

## 2011-05-12 DIAGNOSIS — N138 Other obstructive and reflux uropathy: Secondary | ICD-10-CM | POA: Insufficient documentation

## 2011-05-12 DIAGNOSIS — IMO0001 Reserved for inherently not codable concepts without codable children: Secondary | ICD-10-CM | POA: Insufficient documentation

## 2011-05-12 DIAGNOSIS — N480 Leukoplakia of penis: Secondary | ICD-10-CM | POA: Insufficient documentation

## 2011-05-12 DIAGNOSIS — I10 Essential (primary) hypertension: Secondary | ICD-10-CM | POA: Insufficient documentation

## 2011-05-12 DIAGNOSIS — Z7982 Long term (current) use of aspirin: Secondary | ICD-10-CM | POA: Insufficient documentation

## 2011-05-12 DIAGNOSIS — G609 Hereditary and idiopathic neuropathy, unspecified: Secondary | ICD-10-CM | POA: Insufficient documentation

## 2011-05-12 DIAGNOSIS — M3500 Sicca syndrome, unspecified: Secondary | ICD-10-CM | POA: Insufficient documentation

## 2011-05-12 LAB — CBC
Hemoglobin: 14.2 g/dL (ref 13.0–17.0)
MCH: 31.1 pg (ref 26.0–34.0)
Platelets: 212 10*3/uL (ref 150–400)
RBC: 4.56 MIL/uL (ref 4.22–5.81)
WBC: 7.7 10*3/uL (ref 4.0–10.5)

## 2011-05-12 SURGERY — BIOPSY, PENIS
Anesthesia: General | Site: Penis | Wound class: Clean

## 2011-05-12 MED ORDER — LACTATED RINGERS IV SOLN: INTRAVENOUS | Status: DC

## 2011-05-12 MED ORDER — CEFAZOLIN SODIUM 1-5 GM-% IV SOLN
INTRAVENOUS | Status: DC | PRN
  Administered 2011-05-12: 2 g via INTRAVENOUS

## 2011-05-12 MED ORDER — FENTANYL CITRATE 0.05 MG/ML IJ SOLN
INTRAMUSCULAR | Status: DC | PRN
  Administered 2011-05-12 (×2): 25 ug via INTRAVENOUS
  Administered 2011-05-12: 50 ug via INTRAVENOUS

## 2011-05-12 MED ORDER — LIDOCAINE HCL 2 % EX GEL
CUTANEOUS | Status: DC | PRN
  Administered 2011-05-12: 1 via TOPICAL

## 2011-05-12 MED ORDER — PROPOFOL 10 MG/ML IV EMUL
INTRAVENOUS | Status: DC | PRN
  Administered 2011-05-12: 200 mg via INTRAVENOUS

## 2011-05-12 MED ORDER — FENTANYL CITRATE 0.05 MG/ML IJ SOLN: 25.0000 ug | INTRAMUSCULAR | Status: DC | PRN

## 2011-05-12 MED ORDER — ACETAMINOPHEN 10 MG/ML IV SOLN
INTRAVENOUS | Status: DC | PRN
  Administered 2011-05-12: 1000 mg via INTRAVENOUS

## 2011-05-12 MED ORDER — KETAMINE HCL 10 MG/ML IJ SOLN
INTRAMUSCULAR | Status: DC | PRN
  Administered 2011-05-12: 20 mg via INTRAVENOUS

## 2011-05-12 MED ORDER — ONDANSETRON HCL 4 MG/2ML IJ SOLN
INTRAMUSCULAR | Status: DC | PRN
  Administered 2011-05-12: 4 mg via INTRAVENOUS

## 2011-05-12 MED ORDER — ACETAMINOPHEN 10 MG/ML IV SOLN
INTRAVENOUS | Status: AC
  Filled 2011-05-12: qty 100

## 2011-05-12 MED ORDER — PROMETHAZINE HCL 25 MG/ML IJ SOLN: 6.2500 mg | INTRAMUSCULAR | Status: DC | PRN

## 2011-05-12 MED ORDER — BACITRACIN ZINC 500 UNIT/GM EX OINT
TOPICAL_OINTMENT | CUTANEOUS | Status: AC
  Filled 2011-05-12: qty 15

## 2011-05-12 MED ORDER — LIDOCAINE HCL 2 % EX GEL
CUTANEOUS | Status: AC
  Filled 2011-05-12: qty 10

## 2011-05-12 MED ORDER — CEFAZOLIN SODIUM-DEXTROSE 2-3 GM-% IV SOLR
INTRAVENOUS | Status: AC
  Filled 2011-05-12: qty 50

## 2011-05-12 MED ORDER — BACITRACIN ZINC 500 UNIT/GM EX OINT
TOPICAL_OINTMENT | CUTANEOUS | Status: DC | PRN
  Administered 2011-05-12: 1 via TOPICAL

## 2011-05-12 MED ORDER — MIDAZOLAM HCL 5 MG/5ML IJ SOLN
INTRAMUSCULAR | Status: DC | PRN
  Administered 2011-05-12: 2 mg via INTRAVENOUS

## 2011-05-12 MED ORDER — LACTATED RINGERS IV SOLN
INTRAVENOUS | Status: DC | PRN
  Administered 2011-05-12: 07:00:00 via INTRAVENOUS

## 2011-05-12 SURGICAL SUPPLY — 28 items
BANDAGE COBAN STERILE 2 (GAUZE/BANDAGES/DRESSINGS) ×2 IMPLANT
BLADE SURG 15 STRL LF DISP TIS (BLADE) ×1 IMPLANT
BLADE SURG 15 STRL SS (BLADE) ×2
CLOTH BEACON ORANGE TIMEOUT ST (SAFETY) ×2 IMPLANT
COVER SURGICAL LIGHT HANDLE (MISCELLANEOUS) ×4 IMPLANT
DECANTER SPIKE VIAL GLASS SM (MISCELLANEOUS) IMPLANT
DRAPE LAPAROTOMY T 102X78X121 (DRAPES) ×2 IMPLANT
DRSG TEGADERM 2-3/8X2-3/4 SM (GAUZE/BANDAGES/DRESSINGS) ×1 IMPLANT
ELECT NDL TIP 2.8 STRL (NEEDLE) ×1 IMPLANT
ELECT NEEDLE TIP 2.8 STRL (NEEDLE) ×2 IMPLANT
ELECT REM PT RETURN 9FT ADLT (ELECTROSURGICAL) ×2
ELECTRODE REM PT RTRN 9FT ADLT (ELECTROSURGICAL) ×1 IMPLANT
GAUZE SPONGE 4X4 16PLY XRAY LF (GAUZE/BANDAGES/DRESSINGS) ×2 IMPLANT
GAUZE XEROFORM 1X8 LF (GAUZE/BANDAGES/DRESSINGS) ×2 IMPLANT
GLOVE BIOGEL M STRL SZ7.5 (GLOVE) ×2 IMPLANT
GOWN PREVENTION PLUS XLARGE (GOWN DISPOSABLE) ×2 IMPLANT
GOWN STRL REIN XL XLG (GOWN DISPOSABLE) ×2 IMPLANT
KIT BASIN OR (CUSTOM PROCEDURE TRAY) ×2 IMPLANT
NDL HYPO 25X1 1.5 SAFETY (NEEDLE) IMPLANT
NEEDLE HYPO 25X1 1.5 SAFETY (NEEDLE) ×2 IMPLANT
NS IRRIG 1000ML POUR BTL (IV SOLUTION) ×2 IMPLANT
PACK BASIC VI WITH GOWN DISP (CUSTOM PROCEDURE TRAY) ×2 IMPLANT
PENCIL BUTTON HOLSTER BLD 10FT (ELECTRODE) ×2 IMPLANT
SPONGE GAUZE 4X4 12PLY (GAUZE/BANDAGES/DRESSINGS) ×2 IMPLANT
SUT VIC AB 4-0 PS2 27 (SUTURE) ×2 IMPLANT
SUT VIC AB 5-0 RB1 27 (SUTURE) ×2 IMPLANT
SYR CONTROL 10ML LL (SYRINGE) ×1 IMPLANT
WATER STERILE IRR 1500ML POUR (IV SOLUTION) ×1 IMPLANT

## 2011-05-12 NOTE — Anesthesia Postprocedure Evaluation (Signed)
  Anesthesia Post-op Note  Patient: [name]  Procedure(s) Performed:  PENILE BIOPSY - penile biopsy anesthesia : mac and local  Patient Location: PACU  Anesthesia Type: General  Level of Consciousness: awake and alert   Airway and Oxygen Therapy: Patient Spontanous Breathing  Post-op Pain: mild  Post-op Assessment: Post-op Vital signs reviewed, Patient's Cardiovascular Status Stable, Respiratory Function Stable, Patent Airway and No signs of Nausea or vomiting  Post-op Vital Signs: stable  Complications: No apparent anesthesia complications

## 2011-05-12 NOTE — Anesthesia Preprocedure Evaluation (Addendum)
Anesthesia Evaluation  Patient identified by MRN, date of birth, ID band Patient awake    Reviewed: Allergy & Precautions, H&P , NPO status , Patient's Chart, lab work & pertinent test results  Airway Mallampati: II TM Distance: >3 FB Neck ROM: Full    Dental No notable dental hx.    Pulmonary neg pulmonary ROS, sleep apnea ,  clear to auscultation  Pulmonary exam normal       Cardiovascular hypertension, Pt. on medications + CAD (stent lad 2010. asympto matic now) and neg cardio ROS Regular Normal    Neuro/Psych  Headaches, PSYCHIATRIC DISORDERS Anxiety  Neuromuscular disease Negative Neurological ROS  Negative Psych ROS   GI/Hepatic negative GI ROS, Neg liver ROS, GERD-  Medicated and Controlled,  Endo/Other  Negative Endocrine ROS  Renal/GU negative Renal ROS  Genitourinary negative   Musculoskeletal negative musculoskeletal ROS (+) Fibromyalgia -, narcotic dependent  Abdominal   Peds negative pediatric ROS (+)  Hematology negative hematology ROS (+)   Anesthesia Other Findings   Reproductive/Obstetrics negative OB ROS                          Anesthesia Physical Anesthesia Plan  ASA: II  Anesthesia Plan: General   Post-op Pain Management:    Induction: Intravenous  Airway Management Planned: LMA  Additional Equipment:   Intra-op Plan:   Post-operative Plan: Extubation in OR  Informed Consent: I have reviewed the patients History and Physical, chart, labs and discussed the procedure including the risks, benefits and alternatives for the proposed anesthesia with the patient or authorized representative who has indicated his/her understanding and acceptance.   Dental advisory given  Plan Discussed with: CRNA  Anesthesia Plan Comments:        Anesthesia Quick Evaluation

## 2011-05-12 NOTE — Transfer of Care (Signed)
Immediate Anesthesia Transfer of Care Note  Patient: [name]  Procedure(s) Performed:  PENILE BIOPSY - penile biopsy anesthesia : mac and local  Patient Location: PACU  Anesthesia Type: General  Level of Consciousness: sedated, patient cooperative and responds to stimulaton  Airway & Oxygen Therapy: Patient Spontanous Breathing and Patient connected to face mask oxgen  Post-op Assessment: Report given to PACU RN and Post -op Vital signs reviewed and stable  Post vital signs: Reviewed and stable  Complications: No apparent anesthesia complications

## 2011-05-12 NOTE — Op Note (Signed)
Preoperative diagnosis: #1penile lesion rule out squamous cell carcinoma #2 meatal stenosis Postoperative diagnosis: Same  Procedure: Penile biopsy and meatal dilation   Surgeon: Valetta Fuller M.D.  Anesthesia: Gen.  Indications: Mr. Ancheta has had chronic problems with penile skin irritation status post prior history of adult circumcision. He is also had considerable problems with meatal stenosis. We recently saw him for the first time. In the ventral aspect of his penis near the coronal sulcus there was an area of clearly abnormal thickened and somewhat erythematous skin. There was a concern that this could be squamous cell carcinoma or CIS. We recommended biopsy. The patient has requested meatal dilation this morning for some recurrent stenosis.     Technique and findings: The patient had successful induction of general anesthesia. He was placed in supine position appropriate usual manner. Appropriate surgical timeout was performed. An elliptical biopsy was done in 2 areas on the ventral aspect of his penis. Small wedges of skin were sent for pathologic analysis. The base of these was fulgurated with electrocautery. 5-0 Vicryl was utilized to to close the defect. A meatal dilation was then performed from 16-24 [place]. The patient had no obvious complications and was brought to recovery in stable condition.

## 2011-05-12 NOTE — Progress Notes (Signed)
Dr. Acey Lav made aware of patient's blood pressures- aldrete scores- and heart rates

## 2011-05-12 NOTE — Interval H&P Note (Signed)
History and Physical Interval Note:  05/12/2011 7:30 AM  [name]  has presented today for surgery, with the diagnosis of penile lesion  The various methods of treatment have been discussed with the patient and family. After consideration of risks, benefits and other options for treatment, the patient has consented to  Procedure(s): PENILE BIOPSY as a surgical intervention .  The patients' history has been reviewed, patient examined, no change in status, stable for surgery.  I have reviewed the patients' chart and labs.  Questions were answered to the patient's satisfaction.     Olanda Boughner S

## 2011-07-22 ENCOUNTER — Encounter: Payer: Self-pay | Admitting: Cardiology

## 2011-07-30 ENCOUNTER — Encounter (HOSPITAL_COMMUNITY): Payer: Self-pay | Admitting: Emergency Medicine

## 2011-07-30 ENCOUNTER — Emergency Department (HOSPITAL_COMMUNITY)
Admission: EM | Admit: 2011-07-30 | Discharge: 2011-07-31 | Disposition: A | Discharge status: Home / Self Care | Payer: Medicare Other | Attending: Emergency Medicine | Admitting: Emergency Medicine

## 2011-07-30 DIAGNOSIS — F410 Panic disorder [episodic paroxysmal anxiety] without agoraphobia: Secondary | ICD-10-CM | POA: Insufficient documentation

## 2011-07-30 DIAGNOSIS — K219 Gastro-esophageal reflux disease without esophagitis: Secondary | ICD-10-CM | POA: Insufficient documentation

## 2011-07-30 DIAGNOSIS — K5900 Constipation, unspecified: Secondary | ICD-10-CM | POA: Insufficient documentation

## 2011-07-30 DIAGNOSIS — F419 Anxiety disorder, unspecified: Secondary | ICD-10-CM

## 2011-07-30 DIAGNOSIS — M25519 Pain in unspecified shoulder: Secondary | ICD-10-CM

## 2011-07-30 DIAGNOSIS — I2510 Atherosclerotic heart disease of native coronary artery without angina pectoris: Secondary | ICD-10-CM | POA: Insufficient documentation

## 2011-07-30 DIAGNOSIS — M3500 Sicca syndrome, unspecified: Secondary | ICD-10-CM | POA: Insufficient documentation

## 2011-07-30 DIAGNOSIS — F411 Generalized anxiety disorder: Secondary | ICD-10-CM | POA: Insufficient documentation

## 2011-07-30 DIAGNOSIS — Z79899 Other long term (current) drug therapy: Secondary | ICD-10-CM | POA: Insufficient documentation

## 2011-07-30 DIAGNOSIS — IMO0001 Reserved for inherently not codable concepts without codable children: Secondary | ICD-10-CM | POA: Insufficient documentation

## 2011-07-30 DIAGNOSIS — Z7982 Long term (current) use of aspirin: Secondary | ICD-10-CM | POA: Insufficient documentation

## 2011-07-30 DIAGNOSIS — R1031 Right lower quadrant pain: Secondary | ICD-10-CM | POA: Insufficient documentation

## 2011-07-30 DIAGNOSIS — R10813 Right lower quadrant abdominal tenderness: Secondary | ICD-10-CM | POA: Insufficient documentation

## 2011-07-30 DIAGNOSIS — R109 Unspecified abdominal pain: Secondary | ICD-10-CM | POA: Insufficient documentation

## 2011-07-30 LAB — CBC
HCT: 45.9 % (ref 39.0–52.0)
MCH: 31.7 pg (ref 26.0–34.0)
MCHC: 34.9 g/dL (ref 30.0–36.0)
MCV: 90.9 fL (ref 78.0–100.0)
RDW: 12.9 % (ref 11.5–15.5)

## 2011-07-30 LAB — COMPREHENSIVE METABOLIC PANEL
AST: 27 U/L (ref 0–37)
CO2: 31 mEq/L (ref 19–32)
Calcium: 8.7 mg/dL (ref 8.4–10.5)
Creatinine, Ser: 0.9 mg/dL (ref 0.50–1.35)
GFR calc non Af Amer: >90 mL/min (ref >90)

## 2011-07-30 LAB — URINALYSIS, ROUTINE W REFLEX MICROSCOPIC
Bilirubin Urine: NEGATIVE
Glucose, UA: NEGATIVE mg/dL
Hgb urine dipstick: NEGATIVE
Ketones, ur: NEGATIVE mg/dL
Protein, ur: NEGATIVE mg/dL

## 2011-07-30 LAB — DIFFERENTIAL
Basophils Absolute: 0.1 10*3/uL (ref 0.0–0.1)
Basophils Relative: 1 % (ref 0–1)
Eosinophils Relative: 3 % (ref 0–5)
Lymphocytes Relative: 29 % (ref 12–46)
Monocytes Absolute: 0.7 10*3/uL (ref 0.1–1.0)

## 2011-07-30 NOTE — ED Notes (Signed)
PT. REPORTS RLQ PAIN FOR SEVERAL MONTHS WORSE PAST SEVERAL DAYS , PAIN RADIATING TO RIGHT GROIN / TESTICLES , STATES HISTORY OF HERNIA REPAIR , DENIES NAUSEA OR VOMITTING , ALSO REPORTS CONSTIPATION .

## 2011-07-31 ENCOUNTER — Encounter (HOSPITAL_COMMUNITY): Payer: Self-pay | Admitting: *Deleted

## 2011-07-31 ENCOUNTER — Emergency Department (HOSPITAL_COMMUNITY): Payer: Medicare Other

## 2011-07-31 MED ORDER — OXYCODONE-ACETAMINOPHEN 5-325 MG PO TABS
2.0000 | ORAL_TABLET | Freq: Once | ORAL | Status: AC
  Administered 2011-07-31: 2 via ORAL
  Filled 2011-07-31: qty 2

## 2011-07-31 MED ORDER — ONDANSETRON 4 MG PO TBDP
8.0000 mg | ORAL_TABLET | Freq: Once | ORAL | Status: AC
  Administered 2011-07-31: 8 mg via ORAL
  Filled 2011-07-31: qty 2

## 2011-07-31 MED ORDER — LORAZEPAM 1 MG PO TABS: 1.0000 mg | ORAL_TABLET | Freq: Three times a day (TID) | ORAL | Status: DC | PRN

## 2011-07-31 MED ORDER — LORAZEPAM 1 MG PO TABS
1.0000 mg | ORAL_TABLET | Freq: Once | ORAL | Status: AC
  Administered 2011-07-31: 1 mg via ORAL
  Filled 2011-07-31: qty 1

## 2011-07-31 MED ORDER — LORAZEPAM 1 MG PO TABS: 1.0000 mg | ORAL_TABLET | Freq: Three times a day (TID) | ORAL | Status: AC | PRN

## 2011-07-31 MED ORDER — IBUPROFEN 800 MG PO TABS: 800.0000 mg | ORAL_TABLET | Freq: Three times a day (TID) | ORAL | Status: AC | PRN

## 2011-07-31 MED ORDER — HYDROCODONE-ACETAMINOPHEN 5-325 MG PO TABS: 2.0000 | ORAL_TABLET | ORAL | Status: DC | PRN

## 2011-07-31 NOTE — Discharge Instructions (Signed)
Please review the instructions below. You were evaluated in the emergency department tonight for your persistent chronic abdominal pain, your right shoulder pain and your anxiety/panic disorder. We have medicated you for pain and for your anxiety and you admit to feeling much better. The x-ray of your right shoulder found degenerative changes and changes that may suggest a rotator cuff tendinitis. We are placing you on anti-inflammatory for a week. Continue your pain medication regimen at home as previously instructed. We are also giving you medication for your anxiety. Please take as directed. In view of your recent normal CAT abdominal scan you'll need to arrange follow up with your primary care physicianfor further evaluation of this ongoing persistent abdominal pain and for reevaluation of your right shoulder pain. Return if her symptoms worsen otherwise followup as agreed.     Abdominal Pain Many things can cause belly (abdominal) pain. Most times, the belly pain is not dangerous. The amount of belly pain does not tell how serious the problem may be. Many cases of belly pain can be watched and treated at home. HOME CARE   Do not take medicines that help you go poop (laxatives) unless told to by your doctor.   Only take medicine as told by your doctor.   Eat or drink as told by your doctor. Your doctor will tell you if you should be on a special diet.  GET HELP RIGHT AWAY IF:   The pain does not go away.   You have a fever.   You keep throwing up (vomiting).   The pain changes and is only in the right or left part of the belly.   You have bloody or tarry looking poop.  MAKE SURE YOU:   Understand these instructions.   Will watch your condition.   Will get help right away if you are not doing well or get worse.  Document Released: 10/15/2007 Document Revised: 04/17/2011 Document Reviewed: 05/14/2009 [facility] Patient Information 2012 [place], [place].Anxiety and Panic  Attacks Anxiety is your body's way of reacting to real danger or something you think is a danger. It may be fear or worry over a situation like losing your job. Sometimes the cause is not known. A panic attack is made up of physical signs like sweating, shaking, or chest pain. Anxiety and panic attacks may start suddenly. They may be strong. They may come at any time of day, even while sleeping. They may come at any time of life. Panic attacks are scary, but they do not harm you physically.  HOME CARE  Avoid any known causes of your anxiety.   Try to relax. Yoga may help. Tell yourself everything will be okay.   Exercise often.   Get expert advice and help (therapy) to stop anxiety or attacks from happening.   Avoid caffeine, alcohol, and drugs.   Only take medicine as told by your doctor.  GET HELP RIGHT AWAY IF:  Your attacks seem different than normal attacks.   Your problems are getting worse or concern you.  MAKE SURE YOU:  Understand these instructions.   Will watch your condition.   Will get help right away if you are not doing well or get worse.  Document Released: 05/31/2010 Document Revised: 04/17/2011 Document Reviewed: 05/31/2010 [facility] Patient Information 2012 [place], [place].Anxiety and Panic Attacks Anxiety is your body's way of reacting to real danger or something you think is a danger. It may be fear or worry over a situation like losing your job. Sometimes  the cause is not known. A panic attack is made up of physical signs like sweating, shaking, or chest pain. Anxiety and panic attacks may start suddenly. They may be strong. They may come at any time of day, even while sleeping. They may come at any time of life. Panic attacks are scary, but they do not harm you physically.  HOME CARE  Avoid any known causes of your anxiety.   Try to relax. Yoga may help. Tell yourself everything will be okay.   Exercise often.   Get expert advice and help (therapy) to stop  anxiety or attacks from happening.   Avoid caffeine, alcohol, and drugs.   Only take medicine as told by your doctor.  GET HELP RIGHT AWAY IF:  Your attacks seem different than normal attacks.   Your problems are getting worse or concern you.  MAKE SURE YOU:  Understand these instructions.   Will watch your condition.   Will get help right away if you are not doing well or get worse.  Document Released: 05/31/2010 Document Revised: 04/17/2011 Document Reviewed: 05/31/2010 [facility] Patient Information 2012 [place], [place].

## 2011-07-31 NOTE — ED Notes (Signed)
Assumed care of pt.  Pt is noted to be pacing the halls-reports that he is having a panic attack.  Pt calm, cooperative.  Reports severe pain in his groin and RLQ.

## 2011-07-31 NOTE — ED Provider Notes (Signed)
History     CSN: [id number]  Arrival date & time 07/30/11  2009   First MD Initiated Contact with Patient 07/31/11 0101      Chief Complaint  Patient presents with  . Abdominal Pain     Patient is a 61 y.o. male presenting with abdominal pain.  Abdominal Pain The primary symptoms of the illness include abdominal pain. The primary symptoms of the illness do not include fever, shortness of breath, nausea, vomiting, diarrhea, hematemesis, hematochezia or dysuria.  Additional symptoms associated with the illness include constipation.  Pt reports persistent right lower abd pain since 01/2011. Worse over last 2 to 3 days. States pain sometimes radiates into his groin area. States it feels like the hernia he had before it was repaired. States he had a recent ct scan on 07/24/2011 for same that was ordered by his PCP that was w/o acute findings. Pt has results w/ him. Pt also c/o severe panic attack. States he has been given Xanax for his anxiety panic but it does not seem to help. Pt also reports increasing (R) shoulder pain over last 1 to 2 weeks. Denies injury. Pt is pacing and states he can not sit down due to his panic attack.  Past Medical History  Diagnosis Date  . Coronary artery disease     STENT X 1 2010  . Neuropathy   . Fibromyalgia   . Penile lesion   . GERD (gastroesophageal reflux disease)   . History of kidney stones   . Sleep apnea     USES C-PAP  . Anxiety     HX PANIC ATTACKS  . History of vertigo   . Sjogren's disease   . Narcolepsy   . Headache   . Chronic cough   . Dry eyes   . Fluid retention     Past Surgical History  Procedure Date  . Meatotomy X2  . Hernia repair     ING HERNIA REPAIR  . Circumcision   . Vasectomy   . Breast surgery     BIL MASTECTOMY  FOR BENIGN LUMPS  . Carpal tunnell BIL  . Wrist fracture surgery L  X3 SURGERIES  . Coronary angioplasty with stent placement X1 2010    No family history on file.  History  Substance Use  Topics  . Smoking status: Former Smoker    Quit date: 05/12/2000  . Smokeless tobacco: Not on file  . Alcohol Use: No     RECOVERING ALCOHOLIC 9 YRS      Review of Systems  Constitutional: Negative.  Negative for fever.  HENT: Negative.   Eyes: Negative.   Respiratory: Negative.  Negative for chest tightness and shortness of breath.   Cardiovascular: Negative.  Negative for chest pain.  Gastrointestinal: Positive for abdominal pain and constipation. Negative for nausea, vomiting, diarrhea, hematochezia and hematemesis.  Genitourinary: Negative.  Negative for dysuria.  Musculoskeletal: Negative.   Skin: Negative.   Neurological: Negative.   Hematological: Negative.   Psychiatric/Behavioral: Negative.     Allergies  Duloxetine; Lidocaine; Meperidine hcl; Methadone; Oxycodone hcl; Pregabalin; and Zolpidem tartrate  Home Medications   Current Outpatient Rx  Name Route Sig Dispense Refill  . ASPIRIN 325 MG PO TABS Oral Take 325 mg by mouth daily.     Marland Kitchen VITAMIN D 2000 UNITS PO TABS Oral Take 2,000 Units by mouth daily.     Marland Kitchen CITALOPRAM HYDROBROMIDE 20 MG PO TABS Oral Take 20 mg by mouth every evening.     Marland Kitchen  CLONAZEPAM 0.5 MG PO TABS Oral Take 0.5 mg by mouth 2 (two) times daily as needed. For panic attack    . COD LIVER PO Oral Take 1 capsule by mouth daily.      Marland Kitchen DEXTROAMPHETAMINE SULFATE 10 MG PO TABS Oral Take 20 mg by mouth daily.     Marland Kitchen DILTIAZEM HCL ER COATED BEADS 360 MG PO CP24 Oral Take 360 mg by mouth daily before breakfast.     . FENTANYL 100 MCG/HR TD PT72 Transdermal Place 1 patch onto the skin every 3 (three) days.     . FENTANYL 50 MCG/HR TD PT72 Transdermal Place 1 patch onto the skin every 3 (three) days.      . OMEGA-3 FATTY ACIDS 1000 MG PO CAPS Oral Take 2 g by mouth 2 (two) times daily.     Marland Kitchen FLUOCINONIDE 0.05 % EX CREA Topical Apply 1 application topically daily.    . FUROSEMIDE 40 MG PO TABS Oral Take 120 mg by mouth daily before breakfast.     .  GABAPENTIN 600 MG PO TABS Oral Take 1,800 mg by mouth 2 (two) times daily.     . GUAIFENESIN 400 MG PO TABS Oral Take 3 mg by mouth every 4 (four) hours.    Marland Kitchen HYDROCHLOROTHIAZIDE 50 MG PO TABS Oral Take 50 mg by mouth daily as needed. For fluid control    . HYDROMORPHONE HCL 4 MG PO TABS Oral Take 4 mg by mouth every 6 (six) hours as needed. For breakthrough pain.    . IPRATROPIUM BROMIDE 0.06 % NA SOLN Nasal Place 2 sprays into the nose 2 (two) times daily as needed. For dry sinuses    . ISOMETHEPTENE-APAP-DICHLORAL 65-325-100 MG PO CAPS Oral Take 1 capsule by mouth 4 (four) times daily as needed. For migraines    . KETOCONAZOLE 2 % EX CREA Topical Apply 1 application topically 2 (two) times daily.    Marland Kitchen L-LYSINE 500 MG PO TABS Oral Take 1 tablet by mouth 2 (two) times daily.      Marland Kitchen METHOCARBAMOL 750 MG PO TABS Oral Take 750 mg by mouth daily as needed. For muscle/neck pain    . NITROGLYCERIN 0.4 MG SL SUBL Sublingual Place 0.4 mg under the tongue every 5 (five) minutes as needed. For chest discomfort     . OMEPRAZOLE 40 MG PO CPDR Oral Take 40 mg by mouth daily before breakfast.     . PHENYLEPHRINE-IBUPROFEN 10-200 MG PO TABS Oral Take 1 tablet by mouth daily as needed. For sinus headaches     . POLYETHYLENE GLYCOL 3350 PO PACK Oral Take 17 g by mouth 2 (two) times daily. For constipation    . POLYVINYL ALCOHOL-POVIDONE 1.4-0.6 % OP SOLN Both Eyes Place 1-2 drops into both eyes as needed. For dry eyes     . POTASSIUM CHLORIDE 10 MEQ PO TBCR Oral Take 10 mEq by mouth 2 (two) times daily. To replace potassium lost during fluid retention.    Marland Kitchen AMERIGEL BARRIER EX Apply externally Apply 1 application topically as needed. Rash/sores     . TESTOSTERONE CYPIONATE IM Intramuscular Inject 0.8 mLs into the muscle every 7 (seven) days. Fridays    . VITAMIN C 500 MG PO TABS Oral Take 500 mg by mouth daily.        BP 164/95  Pulse 94  Temp(Src) 97.3 F (36.3 C) (Oral)  Resp 24  SpO2 97%  Physical Exam   Constitutional: He appears well-developed and well-nourished.  HENT:  Head: Normocephalic and atraumatic.  Eyes: Conjunctivae are normal.  Neck: Neck supple.  Cardiovascular: Normal rate and regular rhythm.   Pulmonary/Chest: Effort normal and breath sounds normal.  Abdominal: Soft. Bowel sounds are normal. He exhibits no distension. There is tenderness. There is no rebound. Hernia confirmed negative in the right inguinal area and confirmed negative in the left inguinal area.         Mild RLQ TTP  Genitourinary: Testes normal and penis normal. Right testis shows no mass, no swelling and no tenderness. Left testis shows no mass, no swelling and no tenderness.  Musculoskeletal: Normal range of motion.  Neurological: He is alert.  Skin: Skin is warm and dry.  Psychiatric: His mood appears anxious.    ED Course  Procedures   Pt reports feeling much better after medication for anxiety and pain. Pt now able to recline for full assessment.  Findings and clinical impression discussed w/ pt. Will plan for d/c home w/ med for anxiety and anti-inflammatory regimen for (R) shoulder pain. Pt is already under agreement w/ pain management clinic so has medication for pain. Pt is to arrange f/u w/ his PCP for further evaluation of chronic RLQ abd pain. Pt is agreeable w/ plan. I have discussed pt w/ Dr Fredricka Bonine who is in agreement w/ plan.  Labs Reviewed  COMPREHENSIVE METABOLIC PANEL - Abnormal; Notable for the following:    Chloride 95 (*)    All other components within normal limits  URINALYSIS, ROUTINE W REFLEX MICROSCOPIC  CBC  DIFFERENTIAL   Dg Shoulder Right  07/31/2011  *RADIOLOGY REPORT*  Clinical Data: Right shoulder pain for 2 weeks.  RIGHT SHOULDER - 2+ VIEW  Comparison: Right shoulder radiographs performed 07/31/2009  Findings: There is no evidence of fracture or dislocation. Subcortical cystic change is noted along the lateral aspect of the right humeral head.  The right humeral head is  seated within the glenoid fossa.  The acromioclavicular joint is unremarkable in appearance.  No significant soft tissue abnormalities are seen. The visualized portions of the right lung are clear.  IMPRESSION:  1.  No evidence of fracture or dislocation. 2.  Degenerative change along the lateral aspect of the right humeral head, likely reflecting underlying rotator cuff tendinopathy.  Original Report Authenticated By: Tonia Ghent, M.D.     No diagnosis found.    MDM  HPI/PE and clinical findings c/w 1. Abd pain (chronic for several months, source remains unclear, recent normal ct abd/pelvis w/ CM, labs normal, mild RLQ TTP remaining exam unremarkable) 2. Anxiety/Panic (also chronic, much improved w/ PO Ativan) 3. (R) shoulder pain (No injury, Xr w/ findings suggesting rotator cuff tendinopathy, will treat w/ anti-inflammatories and pt to f/u w/ PCP).        Leanne Chang, NP 08/03/11 [phone]

## 2011-07-31 NOTE — ED Notes (Signed)
Pt pacing hallways stating he is currently having a panic attack, pt w/ heavy breathing - family at bedside. Emotional reassurance given and medication provided to pt.

## 2011-07-31 NOTE — ED Notes (Signed)
Pt calmer.  No distress noted.  Continues to stand and walk for comfort.

## 2011-07-31 NOTE — ED Notes (Signed)
Anxious and pacing in triage

## 2011-08-17 NOTE — ED Provider Notes (Signed)
Evaluation and management procedures were performed by the PA/NP/Resident Physician under my supervision/collaboration.   Hassaan Crite D Corvin Sorbo, MD 08/17/11 1545 

## 2012-01-20 ENCOUNTER — Other Ambulatory Visit: Payer: Self-pay | Admitting: Neurology

## 2012-01-20 DIAGNOSIS — R51 Headache: Secondary | ICD-10-CM

## 2012-01-23 ENCOUNTER — Ambulatory Visit: Payer: Medicare Other | Admitting: Cardiology

## 2012-01-23 ENCOUNTER — Encounter: Payer: Self-pay | Admitting: *Deleted

## 2012-01-23 ENCOUNTER — Ambulatory Visit (INDEPENDENT_AMBULATORY_CARE_PROVIDER_SITE_OTHER): Payer: PRIVATE HEALTH INSURANCE | Admitting: Cardiology

## 2012-01-23 ENCOUNTER — Encounter: Payer: Self-pay | Admitting: Cardiology

## 2012-01-23 VITALS — BP 130/80 | HR 68 | Ht 69.5 in | Wt 254.4 lb

## 2012-01-23 DIAGNOSIS — R079 Chest pain, unspecified: Secondary | ICD-10-CM

## 2012-01-23 DIAGNOSIS — E785 Hyperlipidemia, unspecified: Secondary | ICD-10-CM

## 2012-01-23 DIAGNOSIS — I10 Essential (primary) hypertension: Secondary | ICD-10-CM

## 2012-01-23 DIAGNOSIS — I2510 Atherosclerotic heart disease of native coronary artery without angina pectoris: Secondary | ICD-10-CM

## 2012-01-23 MED ORDER — NITROGLYCERIN 0.4 MG SL SUBL: 0.4000 mg | SUBLINGUAL_TABLET | SUBLINGUAL | Status: DC | PRN

## 2012-01-23 NOTE — Patient Instructions (Addendum)
Your physician recommends that you schedule a follow-up appointment in: 3 months.  Your physician recommends that you continue on your current medications as directed. Please refer to the Current Medication list given to you today. Your physician has requested that you have en exercise stress myoview. For further information please visit [email or web address] Please follow instruction sheet, as given.

## 2012-01-23 NOTE — Progress Notes (Signed)
HPI The patient presents for followup of known coronary disease. It's been a while since he was seen in his office. Coincidently he has been getting some chest pain. This is sharp and stabbing happening multiple times a day but he hasn't had any in about 3 days. This has been going on for weeks. However, now he is having increased fatigue and dyspnea with mild exertion. He says this came on somewhat suddenly. He is tired during mild activities. He's not describing PND or orthopnea. He sleeps with CPAP. He's not been having new palpitations, presyncope or syncope. He's not been having any weight gain or edema. He has been having severe headaches and is undergoing evaluation by neurology and he is due to get an MRI.  Allergies  Allergen Reactions  . Demerol (Meperidine)     Severe mental confusion   . Duloxetine Swelling  . Lidocaine Other (See Comments)    REACTION: non-reactive  . Meperidine Hcl Other (See Comments)    REACTION: causes extreme mental reactions.  . Methadone Other (See Comments)    REACTION: causes heart failure  . Oxycodone Hcl Other (See Comments)    REACTION: causes heart failure  . Pregabalin Other (See Comments)    REACTION: chf  . Zolpidem Tartrate Other (See Comments)    REACTION: confusion    Current Outpatient Prescriptions  Medication Sig Dispense Refill  . aspirin 325 MG tablet Take 325 mg by mouth daily.       . Cholecalciferol (VITAMIN D) 2000 UNITS tablet Take 2,000 Units by mouth daily.       . citalopram (CELEXA) 20 MG tablet Take 20 mg by mouth every evening.       . clonazePAM (KLONOPIN) 0.5 MG tablet Take 0.5 mg by mouth at bedtime. For panic attack      . Cod Liver Oil (COD LIVER PO) Take 2 capsules by mouth daily.       . diphenhydrAMINE (BENADRYL) 25 MG tablet Take 25 mg by mouth every 6 (six) hours as needed.      . fluocinonide cream (LIDEX) 0.05 % Apply 1 application topically daily.      . furosemide (LASIX) 40 MG tablet Take 40 mg by  mouth daily before breakfast. Take 2 tablets in AM and 1 tablet in PM.      . gabapentin (NEURONTIN) 600 MG tablet Take 600 mg by mouth 3 (three) times daily.       Marland Kitchen guaifenesin (HUMIBID E) 400 MG TABS Take 3 mg by mouth every 4 (four) hours.      . hydrochlorothiazide (HYDRODIURIL) 50 MG tablet Take 50 mg by mouth daily as needed. For fluid control      . Ibuprofen (ADVIL PO) Take 800 mg by mouth every other day.      . ipratropium (ATROVENT) 0.06 % nasal spray Place 2 sprays into the nose 2 (two) times daily as needed. For dry sinuses      . isometheptene-acetaminophen-dichloralphenazone (MIDRIN) 65-325-100 MG capsule Take 1 capsule by mouth 4 (four) times daily as needed. For migraines      . ketoconazole (NIZORAL) 2 % cream Apply 1 application topically 2 (two) times daily.      Marland Kitchen L-Lysine 500 MG TABS Take 1 tablet by mouth 2 (two) times daily.        . methocarbamol (ROBAXIN) 750 MG tablet Take 750 mg by mouth daily as needed. For muscle/neck pain      . metoprolol succinate (TOPROL-XL) 100  MG 24 hr tablet Take 100 mg by mouth daily. Take with or immediately following a meal.      . naproxen (NAPROSYN) 500 MG tablet Take 500 mg by mouth every other day.      . nitroGLYCERIN (NITROSTAT) 0.4 MG SL tablet Place 0.4 mg under the tongue every 5 (five) minutes as needed. For chest discomfort       . omeprazole (PRILOSEC) 40 MG capsule Take 40 mg by mouth daily before breakfast.       . oxymorphone (OPANA ER) 5 MG 12 hr tablet Take 10 mg by mouth every 12 (twelve) hours.      . Phenylephrine-Ibuprofen 10-200 MG TABS Take 1 tablet by mouth daily as needed. For sinus headaches       . polyethylene glycol (MIRALAX / GLYCOLAX) packet Take 17 g by mouth 2 (two) times daily. For constipation      . polyvinyl alcohol-povidone (HYPOTEARS) 1.4-0.6 % ophthalmic solution Place 1-2 drops into both eyes as needed. For dry eyes       . potassium chloride (KLOR-CON) 10 MEQ CR tablet Take 10 mEq by mouth 2 (two)  times daily. Take 2 tablets in AM and 1 tablet in PM. To replace potassium lost during fluid retention.      . Skin Protectants, Misc. (AMERIGEL BARRIER EX) Apply 1 application topically as needed. Rash/sores       . TESTOSTERONE CYPIONATE IM Inject 0.8 mLs into the muscle every 7 (seven) days. Fridays      . vitamin C (ASCORBIC ACID) 500 MG tablet Take 500 mg by mouth daily.          Past Medical History  Diagnosis Date  . Coronary artery disease     STENT X 1 2010  . Neuropathy   . Fibromyalgia   . Penile lesion   . GERD (gastroesophageal reflux disease)   . History of kidney stones   . Sleep apnea     USES C-PAP  . Anxiety     HX PANIC ATTACKS  . History of vertigo   . Sjogren's disease   . Narcolepsy   . Headache   . Chronic cough   . Dry eyes   . Fluid retention     Past Surgical History  Procedure Date  . Meatotomy X2  . Hernia repair     ING HERNIA REPAIR  . Circumcision   . Vasectomy   . Breast surgery     BIL MASTECTOMY  FOR BENIGN LUMPS  . Carpal tunnell BIL  . Wrist fracture surgery L  X3 SURGERIES  . Coronary angioplasty with stent placement X1 2010    ROS:  As stated in the HPI and negative for all other systems.  PHYSICAL EXAM BP 130/80  Pulse 68  Ht 5' 9.5" (1.765 m)  Wt 254 lb 6.4 oz (115.395 kg)  BMI 37.03 kg/m2 GENERAL:  Well appearing HEENT:  Pupils equal round and reactive, fundi not visualized, oral mucosa unremarkable NECK:  No jugular venous distention, waveform within normal limits, carotid upstroke brisk and symmetric, no bruits, no thyromegaly LYMPHATICS:  No cervical, inguinal adenopathy LUNGS:  Clear to auscultation bilaterally BACK:  No CVA tenderness CHEST:  Unremarkable HEART:  PMI not displaced or sustained,S1 and S2 within normal limits, no S3, no S4, no clicks, no rubs, no murmurs ABD:  Flat, positive bowel sounds normal in frequency in pitch, no bruits, no rebound, no guarding, no midline pulsatile mass, no hepatomegaly, no  splenomegaly  EXT:  2 plus pulses throughout, no edema, no cyanosis no clubbing SKIN:  No rashes no nodules NEURO:  Cranial nerves II through XII grossly intact, motor grossly intact throughout PSYCH:  Cognitively intact, oriented to person place and time   EKG:  Sinus rhythm, rate 68, left axis deviation, no acute ST-T wave changes. 01/23/2012  ASSESSMENT AND PLAN  CORONARY ARTERY DISEASE (ICD-414.00) - Given his new-onset fatigue and his coronary disease he will have an exercise perfusion study. Further evaluation will be based on these results.  DYSLIPIDEMIA  I will defer to Selinda Flavin, MD with a goal LDL of less than 100 and HDL greater than 40.  ESSENTIAL HYPERTENSION, BENIGN (ICD-401.1) - His blood pressures well controlled. He will continue the meds as listed.

## 2012-01-26 ENCOUNTER — Other Ambulatory Visit: Payer: Self-pay | Admitting: Cardiology

## 2012-01-26 ENCOUNTER — Other Ambulatory Visit: Payer: Self-pay | Admitting: *Deleted

## 2012-01-26 DIAGNOSIS — I2510 Atherosclerotic heart disease of native coronary artery without angina pectoris: Secondary | ICD-10-CM

## 2012-01-27 ENCOUNTER — Ambulatory Visit (HOSPITAL_COMMUNITY)
Admission: RE | Admit: 2012-01-27 | Discharge: 2012-01-27 | Disposition: A | Discharge status: Home / Self Care | Payer: PRIVATE HEALTH INSURANCE | Source: Ambulatory Visit | Attending: Cardiology | Admitting: Cardiology

## 2012-01-27 ENCOUNTER — Encounter (HOSPITAL_COMMUNITY)
Admission: RE | Admit: 2012-01-27 | Discharge: 2012-01-27 | Disposition: A | Discharge status: Home / Self Care | Payer: PRIVATE HEALTH INSURANCE | Source: Ambulatory Visit | Attending: Cardiology | Admitting: Cardiology

## 2012-01-27 ENCOUNTER — Encounter (HOSPITAL_COMMUNITY): Payer: Self-pay

## 2012-01-27 ENCOUNTER — Ambulatory Visit (HOSPITAL_COMMUNITY)
Admission: RE | Admit: 2012-01-27 | Discharge: 2012-01-27 | Disposition: A | Discharge status: Home / Self Care | Payer: PRIVATE HEALTH INSURANCE | Source: Ambulatory Visit | Attending: Neurology | Admitting: Neurology

## 2012-01-27 ENCOUNTER — Ambulatory Visit (INDEPENDENT_AMBULATORY_CARE_PROVIDER_SITE_OTHER): Payer: PRIVATE HEALTH INSURANCE

## 2012-01-27 DIAGNOSIS — I2510 Atherosclerotic heart disease of native coronary artery without angina pectoris: Secondary | ICD-10-CM

## 2012-01-27 DIAGNOSIS — R079 Chest pain, unspecified: Secondary | ICD-10-CM | POA: Insufficient documentation

## 2012-01-27 DIAGNOSIS — H538 Other visual disturbances: Secondary | ICD-10-CM | POA: Insufficient documentation

## 2012-01-27 DIAGNOSIS — R51 Headache: Secondary | ICD-10-CM

## 2012-01-27 HISTORY — DX: Heart failure, unspecified: I50.9

## 2012-01-27 HISTORY — DX: Essential (primary) hypertension: I10

## 2012-01-27 MED ORDER — TECHNETIUM TC 99M SESTAMIBI - CARDIOLITE
10.0000 | Freq: Once | INTRAVENOUS | Status: AC | PRN
  Administered 2012-01-27: 10:00:00 9 via INTRAVENOUS

## 2012-01-27 MED ORDER — TECHNETIUM TC 99M SESTAMIBI - CARDIOLITE
30.0000 | Freq: Once | INTRAVENOUS | Status: AC | PRN
  Administered 2012-01-27: 30 via INTRAVENOUS

## 2012-01-27 NOTE — Progress Notes (Signed)
Stress Lab Nurses Notes - [name] 01/27/2012 Reason for doing test: CAD Type of test: Stress Myoview Nurse performing test: Parke Poisson, RN Nuclear Medicine Tech: Lyndel Pleasure Echo Tech: Not Applicable MD performing test: R. Rothbart Family MD: Dimas Aguas Test explained and consent signed: yes IV started: 22g jelco, Saline lock flushed, No redness or edema and Saline lock started in radiology Symptoms: Fatigue  Treatment/Intervention: None Reason test stopped: fatigue After recovery IV was: Discontinued via X-ray tech and No redness or edema Patient to return to Nuc. Med at : 13:15 Patient discharged: Home Patient's Condition upon discharge was: stable Comments: During test peak BP 178/72 & HR 120.  Recovery BP  132/78 & HR 72.  Symptoms resolved in recovery. [name] T

## 2012-01-29 ENCOUNTER — Telehealth: Payer: Self-pay | Admitting: *Deleted

## 2012-01-29 NOTE — Telephone Encounter (Signed)
Left message for patient to call office.  

## 2012-01-29 NOTE — Telephone Encounter (Signed)
Patient informed and copy sent to Dr. Jeannette How office.

## 2012-01-29 NOTE — Telephone Encounter (Signed)
Message copied by Eustace Moore on Thu Jan 29, 2012  1:40 PM ------      Message from: Rollene Rotunda      Created: Wed Jan 28, 2012  2:00 PM       Negative study.  Call Mr. Kinzler with the results and send results to Selinda Flavin, MD

## 2012-01-29 NOTE — Telephone Encounter (Signed)
Message copied by Eustace Moore on Thu Jan 29, 2012  9:10 AM ------      Message from: Rollene Rotunda      Created: Wed Jan 28, 2012  2:00 PM       Negative study.  Call Mr. Westberg with the results and send results to Selinda Flavin, MD

## 2012-01-30 ENCOUNTER — Ambulatory Visit (HOSPITAL_COMMUNITY): Payer: PRIVATE HEALTH INSURANCE

## 2012-04-15 ENCOUNTER — Ambulatory Visit: Payer: PRIVATE HEALTH INSURANCE | Admitting: Cardiology

## 2012-05-10 ENCOUNTER — Ambulatory Visit: Payer: PRIVATE HEALTH INSURANCE | Admitting: Cardiology

## 2012-05-13 ENCOUNTER — Encounter (HOSPITAL_COMMUNITY): Payer: Self-pay | Admitting: Cardiology

## 2012-06-03 ENCOUNTER — Ambulatory Visit: Payer: PRIVATE HEALTH INSURANCE | Admitting: Cardiology

## 2012-07-15 ENCOUNTER — Encounter: Payer: Self-pay | Admitting: Cardiology

## 2012-07-15 ENCOUNTER — Ambulatory Visit (INDEPENDENT_AMBULATORY_CARE_PROVIDER_SITE_OTHER): Payer: Medicare PPO | Admitting: Cardiology

## 2012-07-15 VITALS — BP 127/82 | HR 64 | Ht 70.0 in | Wt 264.8 lb

## 2012-07-15 DIAGNOSIS — I2510 Atherosclerotic heart disease of native coronary artery without angina pectoris: Secondary | ICD-10-CM

## 2012-07-15 DIAGNOSIS — I10 Essential (primary) hypertension: Secondary | ICD-10-CM

## 2012-07-15 DIAGNOSIS — E785 Hyperlipidemia, unspecified: Secondary | ICD-10-CM

## 2012-07-15 DIAGNOSIS — R0602 Shortness of breath: Secondary | ICD-10-CM

## 2012-07-15 MED ORDER — ACYCLOVIR 5 % EX OINT: TOPICAL_OINTMENT | Freq: Every day | CUTANEOUS | Status: DC

## 2012-07-15 NOTE — Patient Instructions (Addendum)
Your physician recommends that you schedule a follow-up appointment in: 1 year. You will receive a reminder letter in the mail in about 10 months reminding you to call and schedule your appointment. If you don't receive this letter, please contact our office. Your physician has recommended you make the following change in your medication: Start zovirax 5% ointment. Apply topically 5 times daily for 4 days. All other medications will remain the same. Your new prescription has been sent to your pharmacy.

## 2012-07-15 NOTE — Progress Notes (Signed)
HPI The patient presents for followup of known coronary disease.  At the last visit he was getting some chest pain. He had a stress perfusion study which demonstrated a preserved ejection fraction without evidence of ischemia or infarct.  Since then he has continued to battle chronic pain. However, chest pain and the headaches he was having have resolved. He's not able to do much because of what has been labeled as fibromyalgia. However, he denies any chest pressure, neck or arm discomfort. He's not having any palpitations, presyncope or syncope. He's not having any PND or orthopnea. He's had no weight gain or edema.  Allergies  Allergen Reactions  . Demerol (Meperidine)     Severe mental confusion   . Duloxetine Swelling  . Lidocaine Other (See Comments)    REACTION: non-reactive  . Meperidine Hcl Other (See Comments)    REACTION: causes extreme mental reactions.  . Methadone Other (See Comments)    REACTION: causes heart failure  . Oxycodone Hcl Other (See Comments)    REACTION: causes heart failure  . Pregabalin Other (See Comments)    REACTION: chf  . Zolpidem Tartrate Other (See Comments)    REACTION: confusion    Current Outpatient Prescriptions  Medication Sig Dispense Refill  . Cholecalciferol (VITAMIN D) 2000 UNITS tablet Take 2,000 Units by mouth daily.       . citalopram (CELEXA) 20 MG tablet Take 20 mg by mouth every evening.       . Cod Liver Oil (COD LIVER PO) Take 2 capsules by mouth daily.       . diphenhydrAMINE (BENADRYL) 25 MG tablet Take 25 mg by mouth every 6 (six) hours as needed.      . fluocinonide cream (LIDEX) 0.05 % Apply 1 application topically daily.      . furosemide (LASIX) 40 MG tablet Take 40 mg by mouth daily before breakfast. Take 2 tablets in AM and 1 tablet in PM.      . gabapentin (NEURONTIN) 600 MG tablet Take 600 mg by mouth 3 (three) times daily.       Marland Kitchen guaifenesin (HUMIBID E) 400 MG TABS Take 3 mg by mouth every 4 (four) hours.      Marland Kitchen  HYDROmorphone (DILAUDID) 4 MG tablet Take 4 mg by mouth 4 (four) times daily.      . Ibuprofen (ADVIL PO) Take 800 mg by mouth every other day.      . isometheptene-acetaminophen-dichloralphenazone (MIDRIN) 65-325-100 MG capsule Take 1 capsule by mouth 4 (four) times daily as needed. For migraines      . ketoconazole (NIZORAL) 2 % cream Apply 1 application topically 2 (two) times daily.      Marland Kitchen L-Lysine 500 MG TABS Take 1 tablet by mouth 2 (two) times daily.        . methocarbamol (ROBAXIN) 750 MG tablet Take 750 mg by mouth daily as needed. For muscle/neck pain      . metoprolol succinate (TOPROL-XL) 100 MG 24 hr tablet Take 100 mg by mouth daily. Take with or immediately following a meal.      . naproxen (NAPROSYN) 500 MG tablet Take 500 mg by mouth every other day.      . nitroGLYCERIN (NITROSTAT) 0.4 MG SL tablet Place 1 tablet (0.4 mg total) under the tongue every 5 (five) minutes as needed. For chest discomfort  25 tablet  3  . omeprazole (PRILOSEC) 40 MG capsule Take 40 mg by mouth daily before breakfast.       .  Phenylephrine-Ibuprofen 10-200 MG TABS Take 1 tablet by mouth daily as needed. For sinus headaches       . polyethylene glycol (MIRALAX / GLYCOLAX) packet Take 17 g by mouth 2 (two) times daily. For constipation      . polyvinyl alcohol-povidone (HYPOTEARS) 1.4-0.6 % ophthalmic solution Place 1-2 drops into both eyes as needed. For dry eyes       . potassium chloride (K-DUR) 10 MEQ tablet Take 10 mEq by mouth 2 (two) times daily. 2 tablets in AM and 1 tablet in PM      . TESTOSTERONE CYPIONATE IM Inject 0.8 mLs into the muscle every 7 (seven) days. Fridays      . vitamin C (ASCORBIC ACID) 500 MG tablet Take 500 mg by mouth daily.        . Skin Protectants, Misc. (AMERIGEL BARRIER EX) Apply 1 application topically as needed. Rash/sores        No current facility-administered medications for this visit.    Past Medical History  Diagnosis Date  . Coronary artery disease     STENT  X 1 2010 Mid LAD  . Neuropathy   . Fibromyalgia   . Penile lesion   . GERD (gastroesophageal reflux disease)   . History of kidney stones   . Sleep apnea     USES C-PAP  . Anxiety     HX PANIC ATTACKS  . History of vertigo   . Sjogren's disease   . Narcolepsy   . Headache   . Chronic cough   . Dry eyes   . Fluid retention   . Hypertension   . CHF (congestive heart failure)     Past Surgical History  Procedure Laterality Date  . Meatotomy  X2  . Hernia repair      ING HERNIA REPAIR  . Circumcision    . Vasectomy    . Breast surgery      BIL MASTECTOMY  FOR BENIGN LUMPS  . Carpal tunnell  BIL  . Wrist fracture surgery  L  X3 SURGERIES  . Coronary angioplasty with stent placement  X1 2010    ROS:  He is complaining of cold sore. Otherwise as stated in the HPI and negative for all other systems.  PHYSICAL EXAM BP 127/82  Pulse 64  Ht 5\' 10"  (1.778 m)  Wt 264 lb 12.8 oz (120.112 kg)  BMI 37.99 kg/m2 GENERAL:  Well appearing HEENT:  Pupils equal round and reactive, fundi not visualized, oral mucosa unremarkable, no visible cold sore NECK:  No jugular venous distention, waveform within normal limits, carotid upstroke brisk and symmetric, no bruits, no thyromegaly LUNGS:  Clear to auscultation bilaterally CHEST:  Unremarkable HEART:  PMI not displaced or sustained,S1 and S2 within normal limits, no S3, no S4, no clicks, no rubs, no murmurs ABD:  Flat, positive bowel sounds normal in frequency in pitch, no bruits, no rebound, no guarding, no midline pulsatile mass, no hepatomegaly, no splenomegaly EXT:  2 plus pulses throughout, no edema, no cyanosis no clubbing  ASSESSMENT AND PLAN  CORONARY ARTERY DISEASE  - The patient has no new sypmtoms.  No further cardiovascular testing is indicated.  We will continue with aggressive risk reduction and meds as listed.  DYSLIPIDEMIA  He does not tolerate statins and would like to avoid this.    ESSENTIAL HYPERTENSION, BENIGN   - His blood pressures well controlled. He will continue the meds as listed.  COLD SORE - I will take the liberty of  prescribing Zovirax topical.

## 2012-07-20 ENCOUNTER — Encounter: Payer: Self-pay | Admitting: Cardiology

## 2013-08-17 ENCOUNTER — Ambulatory Visit: Payer: Medicare PPO | Admitting: Cardiovascular Disease

## 2013-10-10 ENCOUNTER — Inpatient Hospital Stay (HOSPITAL_COMMUNITY)
Admission: AD | Admit: 2013-10-10 | Discharge: 2013-10-13 | DRG: 280 | Disposition: A | Discharge status: Home / Self Care | Payer: Medicare FFS | Source: Other Acute Inpatient Hospital | Attending: Cardiovascular Disease | Admitting: Cardiovascular Disease

## 2013-10-10 ENCOUNTER — Encounter (HOSPITAL_COMMUNITY): Payer: Self-pay | Admitting: *Deleted

## 2013-10-10 DIAGNOSIS — R0602 Shortness of breath: Secondary | ICD-10-CM

## 2013-10-10 DIAGNOSIS — G4733 Obstructive sleep apnea (adult) (pediatric): Secondary | ICD-10-CM

## 2013-10-10 DIAGNOSIS — I10 Essential (primary) hypertension: Secondary | ICD-10-CM

## 2013-10-10 DIAGNOSIS — IMO0001 Reserved for inherently not codable concepts without codable children: Secondary | ICD-10-CM | POA: Diagnosis present

## 2013-10-10 DIAGNOSIS — Z9861 Coronary angioplasty status: Secondary | ICD-10-CM

## 2013-10-10 DIAGNOSIS — G9009 Other idiopathic peripheral autonomic neuropathy: Secondary | ICD-10-CM

## 2013-10-10 DIAGNOSIS — K219 Gastro-esophageal reflux disease without esophagitis: Secondary | ICD-10-CM | POA: Diagnosis present

## 2013-10-10 DIAGNOSIS — Z87891 Personal history of nicotine dependence: Secondary | ICD-10-CM

## 2013-10-10 DIAGNOSIS — I2582 Chronic total occlusion of coronary artery: Secondary | ICD-10-CM | POA: Diagnosis present

## 2013-10-10 DIAGNOSIS — F411 Generalized anxiety disorder: Secondary | ICD-10-CM | POA: Diagnosis present

## 2013-10-10 DIAGNOSIS — Z9989 Dependence on other enabling machines and devices: Secondary | ICD-10-CM

## 2013-10-10 DIAGNOSIS — I5031 Acute diastolic (congestive) heart failure: Secondary | ICD-10-CM

## 2013-10-10 DIAGNOSIS — I2510 Atherosclerotic heart disease of native coronary artery without angina pectoris: Secondary | ICD-10-CM

## 2013-10-10 DIAGNOSIS — E785 Hyperlipidemia, unspecified: Secondary | ICD-10-CM

## 2013-10-10 DIAGNOSIS — I214 Non-ST elevation (NSTEMI) myocardial infarction: Principal | ICD-10-CM

## 2013-10-10 DIAGNOSIS — I509 Heart failure, unspecified: Secondary | ICD-10-CM | POA: Diagnosis present

## 2013-10-10 LAB — BASIC METABOLIC PANEL
BUN: 19 mg/dL (ref 6–23)
CALCIUM: 9.4 mg/dL (ref 8.4–10.5)
CO2: 31 meq/L (ref 19–32)
CREATININE: 1.05 mg/dL (ref 0.50–1.35)
Chloride: 101 mEq/L (ref 96–112)
GFR calc Af Amer: 86 mL/min — ABNORMAL LOW (ref >90)
GFR, EST NON AFRICAN AMERICAN: 74 mL/min — AB (ref >90)
GLUCOSE: 153 mg/dL — AB (ref 70–99)
Potassium: 4.9 mEq/L (ref 3.7–5.3)
Sodium: 140 mEq/L (ref 137–147)

## 2013-10-10 LAB — HEPARIN LEVEL (UNFRACTIONATED): Heparin Unfractionated: 0.19 IU/mL — ABNORMAL LOW (ref 0.30–0.70)

## 2013-10-10 LAB — CBC
HEMATOCRIT: 44.3 % (ref 39.0–52.0)
Hemoglobin: 15.5 g/dL (ref 13.0–17.0)
MCH: 34.2 pg — ABNORMAL HIGH (ref 26.0–34.0)
MCHC: 35 g/dL (ref 30.0–36.0)
MCV: 97.8 fL (ref 78.0–100.0)
Platelets: 233 10*3/uL (ref 150–400)
RBC: 4.53 MIL/uL (ref 4.22–5.81)
RDW: 13.2 % (ref 11.5–15.5)
WBC: 10.4 10*3/uL (ref 4.0–10.5)

## 2013-10-10 LAB — MRSA PCR SCREENING: MRSA BY PCR: POSITIVE — AB

## 2013-10-10 MED ORDER — NITROGLYCERIN IN D5W 200-5 MCG/ML-% IV SOLN
INTRAVENOUS | Status: AC
  Filled 2013-10-10: qty 250

## 2013-10-10 MED ORDER — SODIUM CHLORIDE 0.9 % IJ SOLN: 3.0000 mL | INTRAMUSCULAR | Status: DC | PRN

## 2013-10-10 MED ORDER — NITROGLYCERIN 0.4 MG SL SUBL: 0.4000 mg | SUBLINGUAL_TABLET | SUBLINGUAL | Status: DC | PRN

## 2013-10-10 MED ORDER — METOPROLOL SUCCINATE ER 100 MG PO TB24
100.0000 mg | ORAL_TABLET | Freq: Every day | ORAL | Status: DC
  Administered 2013-10-11 – 2013-10-13 (×3): 100 mg via ORAL
  Filled 2013-10-10 (×3): qty 1

## 2013-10-10 MED ORDER — HEPARIN BOLUS VIA INFUSION
2000.0000 [IU] | Freq: Once | INTRAVENOUS | Status: AC
  Administered 2013-10-10: 2000 [IU] via INTRAVENOUS
  Filled 2013-10-10: qty 2000

## 2013-10-10 MED ORDER — NITROGLYCERIN IN D5W 200-5 MCG/ML-% IV SOLN
2.0000 ug/min | INTRAVENOUS | Status: DC
  Administered 2013-10-10: 10 ug/min via INTRAVENOUS

## 2013-10-10 MED ORDER — ACYCLOVIR 5 % EX OINT: TOPICAL_OINTMENT | Freq: Every day | CUTANEOUS | Status: DC

## 2013-10-10 MED ORDER — ASPIRIN EC 81 MG PO TBEC
81.0000 mg | DELAYED_RELEASE_TABLET | Freq: Every day | ORAL | Status: DC
  Administered 2013-10-10 – 2013-10-13 (×4): 81 mg via ORAL
  Filled 2013-10-10 (×4): qty 1

## 2013-10-10 MED ORDER — SODIUM CHLORIDE 0.9 % IV SOLN: 250.0000 mL | INTRAVENOUS | Status: DC | PRN

## 2013-10-10 MED ORDER — SODIUM CHLORIDE 0.9 % IV SOLN
INTRAVENOUS | Status: DC
  Administered 2013-10-11 (×2): via INTRAVENOUS

## 2013-10-10 MED ORDER — VITAMIN D 50 MCG (2000 UT) PO TABS: 2000.0000 [IU] | ORAL_TABLET | Freq: Every day | ORAL | Status: DC

## 2013-10-10 MED ORDER — POTASSIUM CHLORIDE CRYS ER 20 MEQ PO TBCR: 20.0000 meq | EXTENDED_RELEASE_TABLET | Freq: Every day | ORAL | Status: DC

## 2013-10-10 MED ORDER — SODIUM CHLORIDE 0.9 % IJ SOLN
3.0000 mL | Freq: Two times a day (BID) | INTRAMUSCULAR | Status: DC
  Administered 2013-10-11: 3 mL via INTRAVENOUS

## 2013-10-10 MED ORDER — POTASSIUM CHLORIDE CRYS ER 10 MEQ PO TBCR
10.0000 meq | EXTENDED_RELEASE_TABLET | Freq: Every day | ORAL | Status: DC
  Administered 2013-10-10: 10 meq via ORAL
  Filled 2013-10-10: qty 1

## 2013-10-10 MED ORDER — MUPIROCIN 2 % EX OINT
1.0000 "application " | TOPICAL_OINTMENT | Freq: Two times a day (BID) | CUTANEOUS | Status: DC
  Administered 2013-10-10 – 2013-10-13 (×6): 1 via NASAL
  Filled 2013-10-10 (×2): qty 22

## 2013-10-10 MED ORDER — PANTOPRAZOLE SODIUM 40 MG PO TBEC
40.0000 mg | DELAYED_RELEASE_TABLET | Freq: Every day | ORAL | Status: DC
  Administered 2013-10-11 – 2013-10-13 (×3): 40 mg via ORAL
  Filled 2013-10-10 (×3): qty 1

## 2013-10-10 MED ORDER — FUROSEMIDE 80 MG PO TABS
80.0000 mg | ORAL_TABLET | Freq: Every day | ORAL | Status: DC
  Administered 2013-10-11 – 2013-10-13 (×3): 80 mg via ORAL
  Filled 2013-10-10: qty 1
  Filled 2013-10-10 (×3): qty 2

## 2013-10-10 MED ORDER — POTASSIUM CHLORIDE CRYS ER 10 MEQ PO TBCR
10.0000 meq | EXTENDED_RELEASE_TABLET | Freq: Two times a day (BID) | ORAL | Status: DC
  Administered 2013-10-11 – 2013-10-13 (×5): 10 meq via ORAL
  Filled 2013-10-10 (×7): qty 1

## 2013-10-10 MED ORDER — HEPARIN (PORCINE) IN NACL 100-0.45 UNIT/ML-% IJ SOLN
1600.0000 [IU]/h | INTRAMUSCULAR | Status: DC
  Administered 2013-10-11 (×2): 1600 [IU]/h via INTRAVENOUS
  Filled 2013-10-10 (×3): qty 250

## 2013-10-10 MED ORDER — VITAMIN D3 25 MCG (1000 UNIT) PO TABS
1000.0000 [IU] | ORAL_TABLET | Freq: Every day | ORAL | Status: DC
  Administered 2013-10-11 – 2013-10-13 (×3): 1000 [IU] via ORAL
  Filled 2013-10-10 (×3): qty 1

## 2013-10-10 MED ORDER — POTASSIUM CHLORIDE ER 10 MEQ PO TBCR: 10.0000 meq | EXTENDED_RELEASE_TABLET | Freq: Two times a day (BID) | ORAL | Status: DC

## 2013-10-10 MED ORDER — FUROSEMIDE 40 MG PO TABS: 40.0000 mg | ORAL_TABLET | Freq: Every day | ORAL | Status: DC

## 2013-10-10 MED ORDER — CHLORHEXIDINE GLUCONATE CLOTH 2 % EX PADS
6.0000 | MEDICATED_PAD | Freq: Every day | CUTANEOUS | Status: DC
  Administered 2013-10-11: 6 via TOPICAL

## 2013-10-10 NOTE — Progress Notes (Signed)
ANTICOAGULATION CONSULT NOTE - Initial Consult  Pharmacy Consult for Heparin Indication: chest pain/ACS  Allergies  Allergen Reactions  . Demerol [Meperidine]     Severe mental confusion   . Duloxetine Swelling  . Lidocaine Other (See Comments)    REACTION: non-reactive  . Meperidine Hcl Other (See Comments)    REACTION: causes extreme mental reactions.  . Methadone Other (See Comments)    REACTION: causes heart failure  . Oxycodone Hcl Other (See Comments)    REACTION: causes heart failure  . Pregabalin Other (See Comments)    REACTION: chf  . Zolpidem Tartrate Other (See Comments)    REACTION: confusion    Patient Measurements: Height: 5\' 10"  (177.8 cm) Weight: 259 lb 0.7 oz (117.5 kg) IBW/kg (Calculated) : 73 Heparin Dosing Weight: 99.1 kg  Vital Signs: Temp: 98.6 F (37 C) (06/01 1945) Temp src: Oral (06/01 1945) BP: 140/77 mmHg (06/01 2000) Pulse Rate: 56 (06/01 2000)  Labs: No results found for this basename: HGB, HCT, PLT, APTT, LABPROT, INR, HEPARINUNFRC, CREATININE, CKTOTAL, CKMB, TROPONINI,  in the last 72 hours  Estimated Creatinine Clearance: 109.3 ml/min (by C-G formula based on Cr of 0.9).   Medical History: Past Medical History  Diagnosis Date  . Coronary artery disease     STENT X 1 2010 Mid LAD  . Neuropathy   . Fibromyalgia   . Penile lesion   . GERD (gastroesophageal reflux disease)   . History of kidney stones   . Sleep apnea     USES C-PAP  . Anxiety     HX PANIC ATTACKS  . History of vertigo   . Sjogren's disease   . Narcolepsy   . Headache(784.0)   . Chronic cough   . Dry eyes   . Fluid retention   . Hypertension   . CHF (congestive heart failure)     Medications:  Prescriptions prior to admission  Medication Sig Dispense Refill  . acyclovir ointment (ZOVIRAX) 5 % Apply topically 5 (five) times daily. For 4 days  15 g  0  . Cholecalciferol (VITAMIN D) 2000 UNITS tablet Take 2,000 Units by mouth daily.       . citalopram  (CELEXA) 20 MG tablet Take 20 mg by mouth every evening.       . Cod Liver Oil (COD LIVER PO) Take 2 capsules by mouth daily.       . diphenhydrAMINE (BENADRYL) 25 MG tablet Take 25 mg by mouth every 6 (six) hours as needed.      . fluocinonide cream (LIDEX) 1.88 % Apply 1 application topically daily.      . furosemide (LASIX) 40 MG tablet Take 40 mg by mouth daily before breakfast. Take 2 tablets in AM and 1 tablet in PM.      . gabapentin (NEURONTIN) 600 MG tablet Take 600 mg by mouth 3 (three) times daily.       Marland Kitchen guaifenesin (HUMIBID E) 400 MG TABS Take 3 mg by mouth every 4 (four) hours.      Marland Kitchen HYDROmorphone (DILAUDID) 4 MG tablet Take 4 mg by mouth 4 (four) times daily.      . Ibuprofen (ADVIL PO) Take 800 mg by mouth every other day.      . isometheptene-acetaminophen-dichloralphenazone (MIDRIN) 65-325-100 MG capsule Take 1 capsule by mouth 4 (four) times daily as needed. For migraines      . ketoconazole (NIZORAL) 2 % cream Apply 1 application topically 2 (two) times daily.      Marland Kitchen  L-Lysine 500 MG TABS Take 1 tablet by mouth 2 (two) times daily.        . methocarbamol (ROBAXIN) 750 MG tablet Take 750 mg by mouth daily as needed. For muscle/neck pain      . metoprolol succinate (TOPROL-XL) 100 MG 24 hr tablet Take 100 mg by mouth daily. Take with or immediately following a meal.      . naproxen (NAPROSYN) 500 MG tablet Take 500 mg by mouth every other day.      . nitroGLYCERIN (NITROSTAT) 0.4 MG SL tablet Place 1 tablet (0.4 mg total) under the tongue every 5 (five) minutes as needed. For chest discomfort  25 tablet  3  . omeprazole (PRILOSEC) 40 MG capsule Take 40 mg by mouth daily before breakfast.       . Phenylephrine-Ibuprofen 10-200 MG TABS Take 1 tablet by mouth daily as needed. For sinus headaches       . polyethylene glycol (MIRALAX / GLYCOLAX) packet Take 17 g by mouth 2 (two) times daily. For constipation      . polyvinyl alcohol-povidone (HYPOTEARS) 1.4-0.6 % ophthalmic solution  Place 1-2 drops into both eyes as needed. For dry eyes       . potassium chloride (K-DUR) 10 MEQ tablet Take 10 mEq by mouth 2 (two) times daily. 2 tablets in AM and 1 tablet in PM      . Skin Protectants, Misc. (AMERIGEL BARRIER EX) Apply 1 application topically as needed. Rash/sores       . TESTOSTERONE CYPIONATE IM Inject 0.8 mLs into the muscle every 7 (seven) days. Fridays      . vitamin C (ASCORBIC ACID) 500 MG tablet Take 500 mg by mouth daily.          Assessment: 63 y/o male who presented to [facility] today with substernal chest pain radiating to his back and fatigue. Initial troponin was 0.04 and second increased to 0.3. He was transferred to [facility] for further treatment.  Pharmacy consulted to begin IV heparin. He was started on IV heparin at [facility]. He was given a 5000 unit bolus at 17:21 and started on 1320 units/hr at 17:33.   Labs from [place]:  SCr 1.1 H/H 16.5/49 Platelets 240  Goal of Therapy:  Heparin level 0.3-0.7 units/ml Monitor platelets by anticoagulation protocol: Yes   Plan:  - Check heparin level at 23:30 - Daily heparin level and CBC - Monitor for s/sx of bleeding  [facility], [place].D., BCPS Clinical Pharmacist Pager: [phone] 10/10/2013 9:21 PM

## 2013-10-10 NOTE — H&P (Signed)
The patient was seen and examined, and I agree with the assessment and plan as documented above, with modifications as noted below. Pt with PMH as noted above. Has been experiencing increasing fatigue while exercising at gym for past 2 months, which he attributed to "old age". 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 Awoke at 6:15 am today with chest pain radiating into left shoulder and arm. Had nausea, no vomiting. No syncope or palpitations. Troponins elevated as noted above.  Prior cath on 08-10-2008 demonstrated the following: Coronaries: The left main was normal. The LAD had proximal 30%  stenosis at the first diagonal. There was mid 90% stenosis at the  second diagonal. The first diagonal was large to moderate size vessel  with ostial 80% stenosis. Second diagonal had an ostial 25% stenosis.  The circumflex in the AV groove had proximal 25% tandem lesions. It  essentially terminated as a large mid obtuse marginal. There was a  ramus intermediate which was large with luminal irregularities. Right  coronary artery was large and dominant with mid luminal irregularities.  The PDA was large and normal.  Will continue to cycle troponins. Currently on IV heparin and IV nitro. 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 Morphine has relieved pain to some degree. Plan for cardiac cath in morning.

## 2013-10-10 NOTE — H&P (Addendum)
Chief Complaint: NSTEMI Primary Cardiologist: Dr. Burt Knack    HPI: The patient is a 63 year old, moderately obese white male, with a past medical history significant for CAD, status post PCI + stenting of the LAD in 2010. He also has a history of hypertension, remote tobacco abuse having quit 11 years ago and OSA, compliant with CPAP. He denies diabetes and hyperlipidemia. He initially presented to [facility], in [place], today with complaint of chest pain and fatigue. He states that he was in his usual state of health until yesterday. He was outside washing his car and developed profound exhaustion and fatigue. This was consistent with the symptoms he had in 2010 prior to undergoing PCI of his LAD. He denied any chest pain yesterday. However, around 3 AM while sleeping, he was awoken by severe, 7/10, sharp substernal chest pain radiating to his back. He had associated diaphoresis and nausea, without vomiting. No syncope or near-syncope. He denied shortness of breath. There were no worsening or alleviating factors. He took 3 sublingual nitroglycerin at home without relief. As his symptoms persisted, he presented to the [facility] ED. An EKG demonstrated borderline T-wave abnormalities in the inferior leads. Also frequent PVCs were demonstrated. Initial troponin was 0.04. His second troponin increased to 0.3. His chest x-ray was unremarkable. Because of his history and ongoing pain, he was transferred to [facility] for further evaluation and treatment.  On arrival to [facility], he continues to have mild 4/10 sharp chest pain. He is now on IV heparin and IV nitroglycerin.  Past Medical History  Diagnosis Date  . Coronary artery disease     STENT X 1 2010 Mid LAD  . Neuropathy   . Fibromyalgia   . Penile lesion   . GERD (gastroesophageal reflux disease)   . History of kidney stones   . Sleep apnea     USES C-PAP  . Anxiety     HX PANIC ATTACKS  . History of vertigo   . Sjogren's  disease   . Narcolepsy   . Headache(784.0)   . Chronic cough   . Dry eyes   . Fluid retention   . Hypertension   . CHF (congestive heart failure)     Past Surgical History  Procedure Laterality Date  . Meatotomy  X2  . Hernia repair      ING HERNIA REPAIR  . Circumcision    . Vasectomy    . Breast surgery      BIL MASTECTOMY  FOR BENIGN LUMPS  . Carpal tunnell  BIL  . Wrist fracture surgery  L  X3 SURGERIES  . Coronary angioplasty with stent placement  X1 2010    Family History  Problems  1. Family history of Death In The Family Father  24yrs  2. Maternal history of Death In The Family Mother  3. Family history of Family Health Status Number Of Children  1 son and 1 daughter   Social History:  reports that he quit smoking about 13 years ago. He does not have any smokeless tobacco history on file. He reports that he does not drink alcohol or use illicit drugs.  Allergies:  Allergies  Allergen Reactions  . Demerol [Meperidine]     Severe mental confusion   . Duloxetine Swelling  . Lidocaine Other (See Comments)    REACTION: non-reactive  . Meperidine Hcl Other (See Comments)    REACTION: causes extreme mental reactions.  . Methadone Other (See Comments)    REACTION: causes heart  failure  . Oxycodone Hcl Other (See Comments)    REACTION: causes heart failure  . Pregabalin Other (See Comments)    REACTION: chf  . Zolpidem Tartrate Other (See Comments)    REACTION: confusion    Medications Prior to Admission  Medication Sig Dispense Refill  . acyclovir ointment (ZOVIRAX) 5 % Apply topically 5 (five) times daily. For 4 days  15 g  0  . Cholecalciferol (VITAMIN D) 2000 UNITS tablet Take 2,000 Units by mouth daily.       . citalopram (CELEXA) 20 MG tablet Take 20 mg by mouth every evening.       . Cod Liver Oil (COD LIVER PO) Take 2 capsules by mouth daily.       . diphenhydrAMINE (BENADRYL) 25 MG tablet Take 25 mg by mouth every 6 (six) hours as needed.      .  fluocinonide cream (LIDEX) 1.61 % Apply 1 application topically daily.      . furosemide (LASIX) 40 MG tablet Take 40 mg by mouth daily before breakfast. Take 2 tablets in AM and 1 tablet in PM.      . gabapentin (NEURONTIN) 600 MG tablet Take 600 mg by mouth 3 (three) times daily.       Marland Kitchen guaifenesin (HUMIBID E) 400 MG TABS Take 3 mg by mouth every 4 (four) hours.      Marland Kitchen HYDROmorphone (DILAUDID) 4 MG tablet Take 4 mg by mouth 4 (four) times daily.      . Ibuprofen (ADVIL PO) Take 800 mg by mouth every other day.      . isometheptene-acetaminophen-dichloralphenazone (MIDRIN) 65-325-100 MG capsule Take 1 capsule by mouth 4 (four) times daily as needed. For migraines      . ketoconazole (NIZORAL) 2 % cream Apply 1 application topically 2 (two) times daily.      Marland Kitchen L-Lysine 500 MG TABS Take 1 tablet by mouth 2 (two) times daily.        . methocarbamol (ROBAXIN) 750 MG tablet Take 750 mg by mouth daily as needed. For muscle/neck pain      . metoprolol succinate (TOPROL-XL) 100 MG 24 hr tablet Take 100 mg by mouth daily. Take with or immediately following a meal.      . naproxen (NAPROSYN) 500 MG tablet Take 500 mg by mouth every other day.      . nitroGLYCERIN (NITROSTAT) 0.4 MG SL tablet Place 1 tablet (0.4 mg total) under the tongue every 5 (five) minutes as needed. For chest discomfort  25 tablet  3  . omeprazole (PRILOSEC) 40 MG capsule Take 40 mg by mouth daily before breakfast.       . Phenylephrine-Ibuprofen 10-200 MG TABS Take 1 tablet by mouth daily as needed. For sinus headaches       . polyethylene glycol (MIRALAX / GLYCOLAX) packet Take 17 g by mouth 2 (two) times daily. For constipation      . polyvinyl alcohol-povidone (HYPOTEARS) 1.4-0.6 % ophthalmic solution Place 1-2 drops into both eyes as needed. For dry eyes       . potassium chloride (K-DUR) 10 MEQ tablet Take 10 mEq by mouth 2 (two) times daily. 2 tablets in AM and 1 tablet in PM      . Skin Protectants, Misc. (AMERIGEL BARRIER EX)  Apply 1 application topically as needed. Rash/sores       . TESTOSTERONE CYPIONATE IM Inject 0.8 mLs into the muscle every 7 (seven) days. Fridays      .  vitamin C (ASCORBIC ACID) 500 MG tablet Take 500 mg by mouth daily.          No results found for this or any previous visit (from the past 48 hour(s)). No results found.  Review of Systems  Constitutional: Positive for malaise/fatigue.  Respiratory: Negative for shortness of breath.   Cardiovascular: Positive for chest pain. Negative for orthopnea, leg swelling and PND.  Gastrointestinal: Positive for nausea. Negative for vomiting and melena.  Musculoskeletal: Positive for back pain.  Neurological: Negative for dizziness and loss of consciousness.  All other systems reviewed and are negative.   Blood pressure 140/77, pulse 56, temperature 98.6 F (37 C), temperature source Oral, resp. rate 20, height 5\' 10"  (1.778 m), weight 259 lb 0.7 oz (117.5 kg), SpO2 100.00%. Physical Exam  Constitutional: He is oriented to person, place, and time. He appears well-developed and well-nourished. No distress.  Neck: No JVD present. Carotid bruit is not present.  Cardiovascular: Normal rate, regular rhythm, normal heart sounds and intact distal pulses.  Exam reveals no gallop and no friction rub.   No murmur heard. Respiratory: Effort normal and breath sounds normal. No respiratory distress. He has no wheezes. He has no rales.  GI: Soft. Bowel sounds are normal. He exhibits no distension and no mass. There is no tenderness.  Musculoskeletal: He exhibits no edema.  Neurological: He is alert and oriented to person, place, and time.  Skin: Skin is dry. He is not diaphoretic.  Psychiatric: He has a normal mood and affect. His behavior is normal.     Assessment/Plan Principal Problem:   NSTEMI (non-ST elevated myocardial infarction) Active Problems:   CORONARY ARTERY DISEASE -s/p PCI + stenting of LAD in 2010   OSA on CPAP   HTN  (hypertension)  1. Non-ST elevation MI: He has a history of CAD, status post PCI plus stenting of his LAD in 2010. Cardiac enzymes at outside hospital were elevated x 2,  in the setting of chest pain. EKG demonstrates T-wave abnormalities in inferior leads. Will continue on IV heparin and IV nitroglycerin. We'll cycle cardiac enzymes here. Will place on daily aspirin and will resume his beta blocker. We'll check lipid panel in a.m. and will initiate a statin if indicated. We'll make n.p.o. at midnight and will plan for diagnostic left heart cath plus or minus PCI. We'll check CMP to assess renal and hepatic function. We'll check an INR in the a.m.  2. Hypertension: blood pressure is controlled currently at 126/69. Continue beta blocker.  3. Obstructive sleep apnea: Will order CPAP.  4. CAD risk factor assessment: We'll check fasting lipid panel in a.m. Will also check hemoglobin A1c, to rule out diabetes.   Lyda Jester, PA-C. 10/10/2013, 9:08 PM

## 2013-10-10 NOTE — Progress Notes (Signed)
[place] for Heparin Indication: chest pain/ACS  Allergies  Allergen Reactions  . Duloxetine Swelling    CHF  . Fentanyl Other (See Comments)    Confusion, panic attacks  . Lidocaine Other (See Comments)    REACTION: non-reactive  . Meperidine Hcl Other (See Comments)    REACTION: causes extreme mental reactions.  . Methadone Other (See Comments)    REACTION: causes heart failure  . Oxycodone Hcl Other (See Comments)    REACTION: causes heart failure  . Pregabalin Other (See Comments)    REACTION: chf  . Zolpidem Tartrate Other (See Comments)    REACTION: confusion    Patient Measurements: Height: 5\' 10"  (177.8 cm) Weight: 259 lb 0.7 oz (117.5 kg) IBW/kg (Calculated) : 73 Heparin Dosing Weight: 99.1 kg  Vital Signs: Temp: 98.6 F (37 C) (06/01 1945) Temp src: Oral (06/01 1945) BP: 133/77 mmHg (06/01 2230) Pulse Rate: 56 (06/01 2230)  Labs:  Recent Labs  10/10/13 2253  HGB 15.5  HCT 44.3  PLT 233  HEPARINUNFRC 0.19*    Estimated Creatinine Clearance: 109.3 ml/min (by C-G formula based on Cr of 0.9).  Assessment: 63 y.o. male with chest pain for heparin   Goal of Therapy:  Heparin level 0.3-0.7 units/ml Monitor platelets by anticoagulation protocol: Yes   Plan:  Heparin 2000 units IV bolus, then increase heparin 1600 units/hr Follow-up am labs.  Phillis Knack, PharmD, BCPS  10/10/2013 11:26 PM

## 2013-10-11 ENCOUNTER — Encounter (HOSPITAL_COMMUNITY)
Admission: AD | Disposition: A | Discharge status: Home / Self Care | Payer: Self-pay | Source: Other Acute Inpatient Hospital | Attending: Cardiovascular Disease

## 2013-10-11 DIAGNOSIS — R079 Chest pain, unspecified: Secondary | ICD-10-CM

## 2013-10-11 HISTORY — PX: LEFT HEART CATHETERIZATION WITH CORONARY ANGIOGRAM: SHX5451

## 2013-10-11 LAB — CBC
HCT: 45.2 % (ref 39.0–52.0)
Hemoglobin: 15.4 g/dL (ref 13.0–17.0)
MCH: 33.4 pg (ref 26.0–34.0)
MCHC: 34.1 g/dL (ref 30.0–36.0)
MCV: 98 fL (ref 78.0–100.0)
PLATELETS: 243 10*3/uL (ref 150–400)
RBC: 4.61 MIL/uL (ref 4.22–5.81)
RDW: 13.2 % (ref 11.5–15.5)
WBC: 11 10*3/uL — ABNORMAL HIGH (ref 4.0–10.5)

## 2013-10-11 LAB — HEMOGLOBIN A1C
Hgb A1c MFr Bld: 5.8 % — ABNORMAL HIGH (ref <5.7)
Mean Plasma Glucose: 120 mg/dL — ABNORMAL HIGH (ref <117)

## 2013-10-11 LAB — LIPID PANEL
CHOLESTEROL: 168 mg/dL (ref 0–200)
HDL: 34 mg/dL — AB (ref >39)
LDL Cholesterol: 100 mg/dL — ABNORMAL HIGH (ref 0–99)
Total CHOL/HDL Ratio: 4.9 RATIO
Triglycerides: 172 mg/dL — ABNORMAL HIGH (ref <150)
VLDL: 34 mg/dL (ref 0–40)

## 2013-10-11 LAB — BASIC METABOLIC PANEL
BUN: 18 mg/dL (ref 6–23)
CALCIUM: 9.2 mg/dL (ref 8.4–10.5)
CO2: 28 mEq/L (ref 19–32)
Chloride: 100 mEq/L (ref 96–112)
Creatinine, Ser: 1.03 mg/dL (ref 0.50–1.35)
GFR calc Af Amer: 88 mL/min — ABNORMAL LOW (ref >90)
GFR, EST NON AFRICAN AMERICAN: 76 mL/min — AB (ref >90)
GLUCOSE: 138 mg/dL — AB (ref 70–99)
Potassium: 4.7 mEq/L (ref 3.7–5.3)
SODIUM: 138 meq/L (ref 137–147)

## 2013-10-11 LAB — HEPARIN LEVEL (UNFRACTIONATED): HEPARIN UNFRACTIONATED: 0.49 [IU]/mL (ref 0.30–0.70)

## 2013-10-11 LAB — PROTIME-INR
INR: 0.95 (ref 0.00–1.49)
PROTHROMBIN TIME: 12.5 s (ref 11.6–15.2)

## 2013-10-11 LAB — TROPONIN I: Troponin I: 12.52 ng/mL (ref <0.30)

## 2013-10-11 SURGERY — LEFT HEART CATHETERIZATION WITH CORONARY ANGIOGRAM
Anesthesia: LOCAL

## 2013-10-11 MED ORDER — MIDAZOLAM HCL 2 MG/2ML IJ SOLN
INTRAMUSCULAR | Status: AC
  Filled 2013-10-11: qty 2

## 2013-10-11 MED ORDER — CLOPIDOGREL BISULFATE 300 MG PO TABS
300.0000 mg | ORAL_TABLET | Freq: Once | ORAL | Status: DC
  Filled 2013-10-11: qty 1

## 2013-10-11 MED ORDER — CLONAZEPAM 0.5 MG PO TABS
0.5000 mg | ORAL_TABLET | Freq: Once | ORAL | Status: AC
  Administered 2013-10-11: 0.5 mg via ORAL
  Filled 2013-10-11: qty 1

## 2013-10-11 MED ORDER — MORPHINE SULFATE 2 MG/ML IJ SOLN
2.0000 mg | Freq: Once | INTRAMUSCULAR | Status: AC
  Administered 2013-10-11: 2 mg via INTRAVENOUS
  Filled 2013-10-11: qty 1

## 2013-10-11 MED ORDER — ACETAMINOPHEN 325 MG PO TABS
650.0000 mg | ORAL_TABLET | Freq: Once | ORAL | Status: AC
  Administered 2013-10-11: 650 mg via ORAL
  Filled 2013-10-11: qty 2

## 2013-10-11 MED ORDER — HEPARIN SODIUM (PORCINE) 1000 UNIT/ML IJ SOLN
INTRAMUSCULAR | Status: AC
  Filled 2013-10-11: qty 1

## 2013-10-11 MED ORDER — ENOXAPARIN SODIUM 120 MG/0.8ML ~~LOC~~ SOLN
1.0000 mg/kg | Freq: Two times a day (BID) | SUBCUTANEOUS | Status: DC
  Administered 2013-10-12 (×2): 115 mg via SUBCUTANEOUS
  Filled 2013-10-11 (×3): qty 0.8

## 2013-10-11 MED ORDER — NITROGLYCERIN 0.2 MG/ML ON CALL CATH LAB
INTRAVENOUS | Status: AC
  Filled 2013-10-11: qty 1

## 2013-10-11 MED ORDER — HEPARIN (PORCINE) IN NACL 2-0.9 UNIT/ML-% IJ SOLN
INTRAMUSCULAR | Status: AC
  Filled 2013-10-11: qty 1500

## 2013-10-11 MED ORDER — LIDOCAINE HCL (PF) 1 % IJ SOLN
INTRAMUSCULAR | Status: AC
  Filled 2013-10-11: qty 30

## 2013-10-11 MED ORDER — CLONAZEPAM 0.5 MG PO TABS
0.5000 mg | ORAL_TABLET | Freq: Three times a day (TID) | ORAL | Status: DC | PRN
  Administered 2013-10-11 (×3): 0.5 mg via ORAL
  Filled 2013-10-11 (×3): qty 1

## 2013-10-11 MED ORDER — FENTANYL CITRATE 0.05 MG/ML IJ SOLN
INTRAMUSCULAR | Status: AC
  Filled 2013-10-11: qty 2

## 2013-10-11 MED ORDER — CLOPIDOGREL BISULFATE 75 MG PO TABS
75.0000 mg | ORAL_TABLET | Freq: Every day | ORAL | Status: DC
  Administered 2013-10-12 – 2013-10-13 (×2): 75 mg via ORAL
  Filled 2013-10-11 (×3): qty 1

## 2013-10-11 MED ORDER — SODIUM CHLORIDE 0.9 % IV SOLN: INTRAVENOUS | Status: AC

## 2013-10-11 MED ORDER — HYDROMORPHONE HCL PF 1 MG/ML IJ SOLN: 1.0000 mg | Freq: Once | INTRAMUSCULAR | Status: DC

## 2013-10-11 MED ORDER — NITROGLYCERIN IN D5W 200-5 MCG/ML-% IV SOLN
2.0000 ug/min | INTRAVENOUS | Status: DC
  Administered 2013-10-11: 5 ug/min via INTRAVENOUS
  Filled 2013-10-11: qty 250

## 2013-10-11 NOTE — Progress Notes (Signed)
RT notified that pt's family has brought in his personal CPAP from home.  No current c/o pain or anxiety and is visiting with family.  Will continue to closely monitor.

## 2013-10-11 NOTE — Care Management Note (Signed)
    Page 1 of 1   10/11/2013     7:34:13 AM CARE MANAGEMENT NOTE 10/11/2013  Patient:  [name], [name]   Account Number:  [id number]  Date Initiated:  10/11/2013  Documentation initiated by:  Elissa Hefty  Subjective/Objective Assessment:   adm w mi     Action/Plan:   lives w wife, pcp dr Lennette Bihari howard   Anticipated DC Date:     Anticipated DC Plan:           Choice offered to / List presented to:             Status of service:   Medicare Important Message given?   (If response is "NO", the following Medicare IM given date fields will be blank) Date Medicare IM given:   Date Additional Medicare IM given:    Discharge Disposition:    Per UR Regulation:  Reviewed for med. necessity/level of care/duration of stay  If discussed at [place] of Stay Meetings, dates discussed:    Comments:

## 2013-10-11 NOTE — Progress Notes (Signed)
Changed pt to auto mode bc pt stated it was leaking too much around the mask and he could not sleep with it like that.

## 2013-10-11 NOTE — Progress Notes (Signed)
Croitoru, MD contacted regarding pt's PRN home medications to include: Klonopin 0.5mg  for anxiety Dilaudid 4mg  for neuropathy pain  Also requesting tylenol for nitro headache and PRN stool softner.  Pt continues to deny any pain, to include chest pain, but states he feels as if he is going to have another "panic attack" and that it's beneficial to take the Klonopin before the panic attack begins to be most effective.  Pt talking with his wife who remains bedside - educated the pt the importance of notifying RN if any chest pain/pressure or any other pain presents.  Pt verbalizes understanding and states he has no questions or concerns at this time.  Will continue to closely monitor.

## 2013-10-11 NOTE — Progress Notes (Signed)
Pt will place on himself on CPAP mask when ready to take a nap.

## 2013-10-11 NOTE — H&P (View-Only) (Signed)
Patient Name: [name] Date of Encounter: 10/11/2013  Principal Problem:   NSTEMI (non-ST elevated myocardial infarction) Active Problems:   CORONARY ARTERY DISEASE -s/p PCI + stenting of LAD in 2010   OSA on CPAP   HTN (hypertension)   Length of Stay: 1  SUBJECTIVE  Angina free now. "Anxiety" much better after diuretics. Takes clonazepam prn at home, no more than 1-2 times a month. Wife has administered IM furosemide at home for 12-14 lb weight gain, none recently.  CURRENT MEDS . aspirin EC  81 mg Oral Daily  . Chlorhexidine Gluconate Cloth  6 each Topical Q0600  . cholecalciferol  1,000 Units Oral Daily  . furosemide  80 mg Oral QAC breakfast  .  HYDROmorphone (DILAUDID) injection  1 mg Intravenous Once  . metoprolol succinate  100 mg Oral Daily  . mupirocin ointment  1 application Nasal BID  . pantoprazole  40 mg Oral Daily  . potassium chloride  10 mEq Oral BID  . sodium chloride  3 mL Intravenous Q12H    OBJECTIVE   Intake/Output Summary (Last 24 hours) at 10/11/13 1218 Last data filed at 10/11/13 1200  Gross per 24 hour  Intake 1107.37 ml  Output   2900 ml  Net -1792.63 ml   Filed Weights   10/10/13 1945  Weight: 259 lb 0.7 oz (117.5 kg)    PHYSICAL EXAM Filed Vitals:   10/11/13 1000 10/11/13 1102 10/11/13 1200 10/11/13 1201  BP: 122/85 106/62 116/64   Pulse: 62 86 64   Temp:    98.8 F (37.1 C)  TempSrc:    Oral  Resp: 10 11 8    Height:      Weight:      SpO2: 97% 99% 98%    General: Alert, oriented x3, no distress Head: no evidence of trauma, PERRL, EOMI, no exophtalmos or lid lag, no myxedema, no xanthelasma; normal ears, nose and oropharynx Neck: normal jugular venous pulsations and no hepatojugular reflux; brisk carotid pulses without delay and no carotid bruits Chest: clear to auscultation, no signs of consolidation by percussion or palpation, normal fremitus, symmetrical and full respiratory excursions Cardiovascular: normal position  and quality of the apical impulse, regular rhythm, normal first and second heart sounds, no rubs or gallops, no murmur Abdomen: no tenderness or distention, no masses by palpation, no abnormal pulsatility or arterial bruits, normal bowel sounds, no hepatosplenomegaly Extremities: no clubbing, cyanosis or edema; 2+ radial, ulnar and brachial pulses bilaterally; 2+ right femoral, posterior tibial and dorsalis pedis pulses; 2+ left femoral, posterior tibial and dorsalis pedis pulses; no subclavian or femoral bruits Neurological: grossly nonfocal  Prior cath on 08-10-2008 demonstrated the following:  Coronaries: The left main was normal. The LAD had proximal 30%  stenosis at the first diagonal. There was mid 90% stenosis at the  second diagonal. The first diagonal was large to moderate size vessel  with ostial 80% stenosis. Second diagonal had an ostial 25% stenosis.  The circumflex in the AV groove had proximal 25% tandem lesions. It  essentially terminated as a large mid obtuse marginal. There was a  ramus intermediate which was large with luminal irregularities. Right  coronary artery was large and dominant with mid luminal irregularities.  The PDA was large and normal.  Underwent stenting of the mid left anterior descending with placement of a 3.0 x 18-mm Xience V drug-eluting stent.    LABS  CBC  Recent Labs  10/10/13 2253 10/11/13 0357  WBC 10.4 11.0*  HGB 15.5 15.4  HCT 44.3 45.2  MCV 97.8 98.0  PLT 233 938   Basic Metabolic Panel  Recent Labs  10/10/13 2253 10/11/13 0357  NA 140 138  K 4.9 4.7  CL 101 100  CO2 31 28  GLUCOSE 153* 138*  BUN 19 18  CREATININE 1.05 1.03  CALCIUM 9.4 9.2   Fasting Lipid Panel  Recent Labs  10/11/13 0357  CHOL 168  HDL 34*  LDLCALC 100*  TRIG 172*  CHOLHDL 4.9   Thyroid Function Tests No results found for this basename: TSH, T4TOTAL, FREET3, T3FREE, THYROIDAB,  in the last 72 hours  Radiology Studies Imaging results have  been reviewed and No results found.  TELE NSR  ECG Lead reversal - will repeat  ECG at [facility] shows QS V1-V2 with very subtle <0.5 mm ST elevation in same leads., left axis deviation  Cardiac enzymes mildly elevated at [facility], not rechecked since transfer - will add to blood in lab.  ASSESSMENT AND PLAN  Unstable angina, possible small NSTEMI - symptoms comparable to previous presentation (2010 - stenting of the mid LAD with 3.0 x 18-mm Xience V drug-eluting stent) Reportedly minor increase in enzymes, will repeat. ECG nondiagnostic. Scheduled for cardiac cath later today. This procedure has been fully reviewed with the patient and written informed consent has been obtained.  Sanda Klein, MD, [facility] [phone] office [phone] pager 10/11/2013 12:18 PM

## 2013-10-11 NOTE — CV Procedure (Signed)
Left Heart Catheterization with Coronary Angiography  Report  [name]  63 y.o.  male [birth year]  Procedure Date: 10/11/2013 Referring Physician: Sallyanne Kuster, MD Primary Cardiologist: Bronson Ing, MD   INDICATIONS: Late presenting myocardial infarction, now 24 hours after onset of pain and with positive cardiac markers  PROCEDURE: 1. Left heart catheterization; 2. Coronary angiography; 3. Left ventriculography  CONSENT:  The risks, benefits, and details of the procedure were explained in detail to the patient. Risks including death, stroke, heart attack, kidney injury, allergy, limb ischemia, bleeding and radiation injury were discussed.  The patient verbalized understanding and wanted to proceed.  Informed written consent was obtained.  PROCEDURE TECHNIQUE:  After Xylocaine anesthesia a 5 French Slender sheath was placed in the right radial artery with an angiocath and the modified Seldinger technique.  Coronary angiography was done using a 5 F 5 [place] JR 4 and 6 [place] XB LAD 3.5 cm guide catheter.  Left ventriculography was done using the JR 4 catheter and hand injection.   Digital images were reviewed. The patient is comfortable supine on the cath table. No chest discomfort. The patient states that his chest discomfort started at 6 AM on Monday morning and was continuous until early this morning. We'll now nearly 10 hours beyond his last episode of pain. The digital images demonstrate total occlusion of the proximal LAD after the first diagonal. There are right to left collaterals. Anterior wall motion is mildly impaired.   CONTRAST:  Total of 160 cc.  COMPLICATIONS:  None   HEMODYNAMICS:  Aortic pressure 98/54 mmHg; LV pressure 100/5 mmHg 14  ANGIOGRAPHIC DATA:   The left main coronary artery is widely patent.  The left anterior descending artery is totally occluded after the first diagonal. This is approximately 12-13 mm proximal to the previously placed mid LAD stent.  Prior angiograms demonstrated a moderate stenosis in this region at the last angiogram in 2010.Marland Kitchen  The left circumflex artery is widely patent with to moderate sized obtuse marginal branches..  The right coronary artery is dominant with a large PDA and several left ventricular branches. The PDA reaches the apex. Collaterals to the distal LAD and into the mid septum are noted from the PDA.    LEFT VENTRICULOGRAM:  Left ventricular angiogram was done in the 30 RAO projection and revealed mild mid to distal anterior hypokinesis and apical akinesis. LVEF is preserved in the low normal range with an estimated value of 50%. EDP is normal.   IMPRESSIONS:  1. Late presenting non-ST elevation anterior myocardial infarction with a prolonged and somewhat stuttering clinical course due to the presence of right to left collaterals. The LAD is now totally occluded and the patient is asymptomatic 36 hours beyond initial onset of pain. 2. Widely patent right coronary and circumflex 3. Low normal global left ventricular function with apical severe hypokinesis/akinesis   RECOMMENDATION:  Medical therapy to include continued therapeutic anticoagulation, perhaps switching to a not apparent to allow the patient to ambulate.  If recurrent angina would give consideration to recanalization of the LAD, however this was not pursued acutely as the patient is pain-free and there is a large thrombus burden within the proximal LAD. The risk being distal embolization of thrombus echo occluded collaterals and cause further anterior wall injury.  Beta blocker and ACE inhibitor therapy  Continue IV nitroglycerin at least another 12 hours  I would be in the Cath Lab tomorrow and if the patient has any chest discomfort with activity  or after nitroglycerin is discontinued, consideration to repeat catheterization and recanalization of the LAD would be reasonable  I will go ahead and start Plavix in addition to  anticoagulation .

## 2013-10-11 NOTE — Interval H&P Note (Signed)
Cath Lab Visit (complete for each Cath Lab visit)  Clinical Evaluation Leading to the Procedure:   ACS: yes  Non-ACS:    Anginal Classification: CCS IV  Anti-ischemic medical therapy: Maximal Therapy (2 or more classes of medications)  Non-Invasive Test Results: No non-invasive testing performed  Prior CABG: No previous CABG      History and Physical Interval Note:  10/11/2013 4:25 PM  [name]  has presented today for surgery, with the diagnosis of NSTEMI  The various methods of treatment have been discussed with the patient and family. After consideration of risks, benefits and other options for treatment, the patient has consented to  Procedure(s): LEFT HEART CATHETERIZATION WITH CORONARY ANGIOGRAM (N/A) as a surgical intervention .  The patient's history has been reviewed, patient examined, no change in status, stable for surgery.  I have reviewed the patient's chart and labs.  Questions were answered to the patient's satisfaction.     Belva Crome III

## 2013-10-11 NOTE — Progress Notes (Signed)
CRITICAL VALUE ALERT  Critical value received:  Troponin 12.52  Date of notification:  10/11/2013  Time of notification:  4650  Critical value read back:yes  Nurse who received alert:  Gemma Payor, RN   MD notified (1st page):  Croitoru, MD   Time of first page:  1443  MD notified (2nd page):  Time of second page:  Responding MD:  Croitoru, MD   Time MD responded:  [id number]

## 2013-10-11 NOTE — Progress Notes (Signed)
Patient Name: [name] Date of Encounter: 10/11/2013  Principal Problem:   NSTEMI (non-ST elevated myocardial infarction) Active Problems:   CORONARY ARTERY DISEASE -s/p PCI + stenting of LAD in 2010   OSA on CPAP   HTN (hypertension)   Length of Stay: 1  SUBJECTIVE  Angina free now. "Anxiety" much better after diuretics. Takes clonazepam prn at home, no more than 1-2 times a month. Wife has administered IM furosemide at home for 12-14 lb weight gain, none recently.  CURRENT MEDS . aspirin EC  81 mg Oral Daily  . Chlorhexidine Gluconate Cloth  6 each Topical Q0600  . cholecalciferol  1,000 Units Oral Daily  . furosemide  80 mg Oral QAC breakfast  .  HYDROmorphone (DILAUDID) injection  1 mg Intravenous Once  . metoprolol succinate  100 mg Oral Daily  . mupirocin ointment  1 application Nasal BID  . pantoprazole  40 mg Oral Daily  . potassium chloride  10 mEq Oral BID  . sodium chloride  3 mL Intravenous Q12H    OBJECTIVE   Intake/Output Summary (Last 24 hours) at 10/11/13 1218 Last data filed at 10/11/13 1200  Gross per 24 hour  Intake 1107.37 ml  Output   2900 ml  Net -1792.63 ml   Filed Weights   10/10/13 1945  Weight: 259 lb 0.7 oz (117.5 kg)    PHYSICAL EXAM Filed Vitals:   10/11/13 1000 10/11/13 1102 10/11/13 1200 10/11/13 1201  BP: 122/85 106/62 116/64   Pulse: 62 86 64   Temp:    98.8 F (37.1 C)  TempSrc:    Oral  Resp: 10 11 8    Height:      Weight:      SpO2: 97% 99% 98%    General: Alert, oriented x3, no distress Head: no evidence of trauma, PERRL, EOMI, no exophtalmos or lid lag, no myxedema, no xanthelasma; normal ears, nose and oropharynx Neck: normal jugular venous pulsations and no hepatojugular reflux; brisk carotid pulses without delay and no carotid bruits Chest: clear to auscultation, no signs of consolidation by percussion or palpation, normal fremitus, symmetrical and full respiratory excursions Cardiovascular: normal position  and quality of the apical impulse, regular rhythm, normal first and second heart sounds, no rubs or gallops, no murmur Abdomen: no tenderness or distention, no masses by palpation, no abnormal pulsatility or arterial bruits, normal bowel sounds, no hepatosplenomegaly Extremities: no clubbing, cyanosis or edema; 2+ radial, ulnar and brachial pulses bilaterally; 2+ right femoral, posterior tibial and dorsalis pedis pulses; 2+ left femoral, posterior tibial and dorsalis pedis pulses; no subclavian or femoral bruits Neurological: grossly nonfocal  Prior cath on 08-10-2008 demonstrated the following:  Coronaries: The left main was normal. The LAD had proximal 30%  stenosis at the first diagonal. There was mid 90% stenosis at the  second diagonal. The first diagonal was large to moderate size vessel  with ostial 80% stenosis. Second diagonal had an ostial 25% stenosis.  The circumflex in the AV groove had proximal 25% tandem lesions. It  essentially terminated as a large mid obtuse marginal. There was a  ramus intermediate which was large with luminal irregularities. Right  coronary artery was large and dominant with mid luminal irregularities.  The PDA was large and normal.  Underwent stenting of the mid left anterior descending with placement of a 3.0 x 18-mm Xience V drug-eluting stent.    LABS  CBC  Recent Labs  10/10/13 2253 10/11/13 0357  WBC 10.4 11.0*  HGB 15.5 15.4  HCT 44.3 45.2  MCV 97.8 98.0  PLT 233 675   Basic Metabolic Panel  Recent Labs  10/10/13 2253 10/11/13 0357  NA 140 138  K 4.9 4.7  CL 101 100  CO2 31 28  GLUCOSE 153* 138*  BUN 19 18  CREATININE 1.05 1.03  CALCIUM 9.4 9.2   Fasting Lipid Panel  Recent Labs  10/11/13 0357  CHOL 168  HDL 34*  LDLCALC 100*  TRIG 172*  CHOLHDL 4.9   Thyroid Function Tests No results found for this basename: TSH, T4TOTAL, FREET3, T3FREE, THYROIDAB,  in the last 72 hours  Radiology Studies Imaging results have  been reviewed and No results found.  TELE NSR  ECG Lead reversal - will repeat  ECG at [facility] shows QS V1-V2 with very subtle <0.5 mm ST elevation in same leads., left axis deviation  Cardiac enzymes mildly elevated at [facility], not rechecked since transfer - will add to blood in lab.  ASSESSMENT AND PLAN  Unstable angina, possible small NSTEMI - symptoms comparable to previous presentation (2010 - stenting of the mid LAD with 3.0 x 18-mm Xience V drug-eluting stent) Reportedly minor increase in enzymes, will repeat. ECG nondiagnostic. Scheduled for cardiac cath later today. This procedure has been fully reviewed with the patient and written informed consent has been obtained.  Sanda Klein, MD, [facility] [phone] office [phone] pager 10/11/2013 12:18 PM

## 2013-10-12 DIAGNOSIS — I10 Essential (primary) hypertension: Secondary | ICD-10-CM

## 2013-10-12 DIAGNOSIS — I5031 Acute diastolic (congestive) heart failure: Secondary | ICD-10-CM | POA: Diagnosis present

## 2013-10-12 LAB — BASIC METABOLIC PANEL
BUN: 15 mg/dL (ref 6–23)
CALCIUM: 8.9 mg/dL (ref 8.4–10.5)
CO2: 26 mEq/L (ref 19–32)
Chloride: 103 mEq/L (ref 96–112)
Creatinine, Ser: 0.96 mg/dL (ref 0.50–1.35)
GFR calc Af Amer: >90 mL/min (ref >90)
GFR calc non Af Amer: 87 mL/min — ABNORMAL LOW (ref >90)
GLUCOSE: 113 mg/dL — AB (ref 70–99)
Potassium: 4.7 mEq/L (ref 3.7–5.3)
SODIUM: 140 meq/L (ref 137–147)

## 2013-10-12 MED ORDER — ENOXAPARIN SODIUM 120 MG/0.8ML ~~LOC~~ SOLN
1.0000 mg/kg | Freq: Two times a day (BID) | SUBCUTANEOUS | Status: DC
  Administered 2013-10-13: 115 mg via SUBCUTANEOUS
  Filled 2013-10-12 (×3): qty 0.8

## 2013-10-12 MED ORDER — SODIUM CHLORIDE 0.9 % IV SOLN: INTRAVENOUS | Status: DC

## 2013-10-12 MED ORDER — GABAPENTIN 600 MG PO TABS
1800.0000 mg | ORAL_TABLET | Freq: Two times a day (BID) | ORAL | Status: DC
  Filled 2013-10-12 (×2): qty 3

## 2013-10-12 MED ORDER — GABAPENTIN 600 MG PO TABS
1800.0000 mg | ORAL_TABLET | Freq: Two times a day (BID) | ORAL | Status: DC
  Administered 2013-10-12 – 2013-10-13 (×2): 1800 mg via ORAL
  Filled 2013-10-12 (×4): qty 3

## 2013-10-12 MED ORDER — LOPERAMIDE HCL 2 MG PO CAPS
4.0000 mg | ORAL_CAPSULE | ORAL | Status: DC | PRN
  Administered 2013-10-12: 4 mg via ORAL
  Filled 2013-10-12: qty 2

## 2013-10-12 NOTE — Progress Notes (Signed)
CARDIAC REHAB PHASE I   PRE:  Rate/Rhythm: 32 SR with PVC    BP: sitting 125/72    SaO2: 96 RA  MODE:  Ambulation: 700 ft   POST:  Rate/Rhythm: 78 SR    BP: sitting 140/81     SaO2: 96 RA  Tolerated well. Denied CP. Feels good. Ed completed. Pt has good understanding and plans to go to [place] program instead of [facility] to avoid copay. To walk independently. [place], ACSM 10/12/2013 2:48 PM

## 2013-10-12 NOTE — Progress Notes (Signed)
Patient Name: [name] Date of Encounter: 10/12/2013  Principal Problem:   NSTEMI (non-ST elevated myocardial infarction) Active Problems:   CORONARY ARTERY DISEASE -s/p PCI + stenting of LAD in 2010   OSA on CPAP   HTN (hypertension)   Length of Stay: 2  SUBJECTIVE  No angina or dyspnea, even when walking  CURRENT MEDS . aspirin EC  81 mg Oral Daily  . cholecalciferol  1,000 Units Oral Daily  . clopidogrel  75 mg Oral Q breakfast  . enoxaparin (LOVENOX) injection  1 mg/kg Subcutaneous Q12H  . furosemide  80 mg Oral QAC breakfast  . metoprolol succinate  100 mg Oral Daily  . mupirocin ointment  1 application Nasal BID  . pantoprazole  40 mg Oral Daily  . potassium chloride  10 mEq Oral BID    OBJECTIVE   Intake/Output Summary (Last 24 hours) at 10/12/13 1236 Last data filed at 10/12/13 0800  Gross per 24 hour  Intake 2086.18 ml  Output   1500 ml  Net 586.18 ml   Filed Weights   10/10/13 1945  Weight: 259 lb 0.7 oz (117.5 kg)    PHYSICAL EXAM Filed Vitals:   10/12/13 0000 10/12/13 0348 10/12/13 0818 10/12/13 1140  BP: 111/47 115/53 131/64 121/71  Pulse: 64 71 58 57  Temp: 98.8 F (37.1 C) 97.8 F (36.6 C) 97.5 F (36.4 C) 97.8 F (36.6 C)  TempSrc: Oral Oral Oral Oral  Resp: 21 14 16 19   Height:      Weight:      SpO2: 98% 98% 99% 99%   General: Alert, oriented x3, no distress Head: no evidence of trauma, PERRL, EOMI, no exophtalmos or lid lag, no myxedema, no xanthelasma; normal ears, nose and oropharynx Neck: normal jugular venous pulsations and no hepatojugular reflux; brisk carotid pulses without delay and no carotid bruits Chest: clear to auscultation, no signs of consolidation by percussion or palpation, normal fremitus, symmetrical and full respiratory excursions Cardiovascular: normal position and quality of the apical impulse, regular rhythm, normal first and second heart sounds, no rubs or gallops, no murmur Abdomen: no tenderness or  distention, no masses by palpation, no abnormal pulsatility or arterial bruits, normal bowel sounds, no hepatosplenomegaly Extremities: no clubbing, cyanosis or edema; 2+ radial, ulnar and brachial pulses bilaterally; 2+ right femoral, posterior tibial and dorsalis pedis pulses; 2+ left femoral, posterior tibial and dorsalis pedis pulses; no subclavian or femoral bruits Neurological: grossly nonfocal  LABS  CBC  Recent Labs  10/10/13 2253 10/11/13 0357  WBC 10.4 11.0*  HGB 15.5 15.4  HCT 44.3 45.2  MCV 97.8 98.0  PLT 233 242   Basic Metabolic Panel  Recent Labs  10/11/13 0357 10/12/13 0259  NA 138 140  K 4.7 4.7  CL 100 103  CO2 28 26  GLUCOSE 138* 113*  BUN 18 15  CREATININE 1.03 0.96  CALCIUM 9.2 8.9   Liver Function Tests No results found for this basename: AST, ALT, ALKPHOS, BILITOT, PROT, ALBUMIN,  in the last 72 hours No results found for this basename: LIPASE, AMYLASE,  in the last 72 hours Cardiac Enzymes  Recent Labs  10/11/13 1347  TROPONINI 12.52*   BNP No components found with this basename: POCBNP,  D-Dimer No results found for this basename: DDIMER,  in the last 72 hours Hemoglobin A1C  Recent Labs  10/10/13 2253  HGBA1C 5.8*   Fasting Lipid Panel  Recent Labs  10/11/13 0357  CHOL 168  HDL 34*  LDLCALC 100*  TRIG 172*  CHOLHDL 4.9   Thyroid Function Tests No results found for this basename: TSH, T4TOTAL, FREET3, T3FREE, THYROIDAB,  in the last 72 hours  Radiology Studies Imaging results have been reviewed and No results found.  TELE NSR  ASSESSMENT AND PLAN  Moderate anterior infarction due to total LAD occlusion with extensive myocardial salvage due to right to left collaterals - medical therapy favored. Diastolic heart failure, resolved. EF 50%, EDP 14 at cath yesterday.  Plan ASA, clopidogrel x 12 months, enoxaparin for another 24 hours or so, statin , ACEi, betablocker. Transfer telemetry. Possible DC in 24-48  hours   Sanda Klein, MD, [facility] [phone] office [phone] pager 10/12/2013 12:36 PM

## 2013-10-13 LAB — BASIC METABOLIC PANEL
BUN: 15 mg/dL (ref 6–23)
CO2: 28 mEq/L (ref 19–32)
Calcium: 9.2 mg/dL (ref 8.4–10.5)
Chloride: 101 mEq/L (ref 96–112)
Creatinine, Ser: 0.99 mg/dL (ref 0.50–1.35)
GFR calc Af Amer: >90 mL/min (ref >90)
GFR, EST NON AFRICAN AMERICAN: 86 mL/min — AB (ref >90)
GLUCOSE: 94 mg/dL (ref 70–99)
POTASSIUM: 4.4 meq/L (ref 3.7–5.3)
Sodium: 142 mEq/L (ref 137–147)

## 2013-10-13 LAB — CBC
HCT: 44.8 % (ref 39.0–52.0)
Hemoglobin: 16 g/dL (ref 13.0–17.0)
MCH: 34.9 pg — ABNORMAL HIGH (ref 26.0–34.0)
MCHC: 35.7 g/dL (ref 30.0–36.0)
MCV: 97.6 fL (ref 78.0–100.0)
Platelets: 236 10*3/uL (ref 150–400)
RBC: 4.59 MIL/uL (ref 4.22–5.81)
RDW: 13 % (ref 11.5–15.5)
WBC: 9.4 10*3/uL (ref 4.0–10.5)

## 2013-10-13 MED ORDER — ISOSORBIDE MONONITRATE ER 30 MG PO TB24: 30.0000 mg | ORAL_TABLET | Freq: Every day | ORAL | Status: DC

## 2013-10-13 MED ORDER — CLOPIDOGREL BISULFATE 75 MG PO TABS: 75.0000 mg | ORAL_TABLET | Freq: Every day | ORAL | Status: DC

## 2013-10-13 NOTE — Progress Notes (Signed)
    Subjective:  No chest pain, anxious to go home  Objective:  Vital Signs in the last 24 hours: Temp:  [97.4 F (36.3 C)-98.7 F (37.1 C)] 97.5 F (36.4 C) (06/04 0546) Pulse Rate:  [57-76] 76 (06/04 0546) Resp:  [18-19] 18 (06/04 0546) BP: (105-121)/(66-73) 105/67 mmHg (06/04 0546) SpO2:  [94 %-100 %] 100 % (06/04 0546)  Intake/Output from previous day:  Intake/Output Summary (Last 24 hours) at 10/13/13 1032 Last data filed at 10/12/13 1400  Gross per 24 hour  Intake    300 ml  Output      0 ml  Net    300 ml    Physical Exam: General appearance: alert, cooperative and no distress Lungs: clear to auscultation bilaterally Heart: regular rate and rhythm   Rate: 74  Rhythm: normal sinus rhythm  Lab Results:  Recent Labs  10/11/13 0357 10/13/13 0310  WBC 11.0* 9.4  HGB 15.4 16.0  PLT 243 236    Recent Labs  10/12/13 0259 10/13/13 0310  NA 140 142  K 4.7 4.4  CL 103 101  CO2 26 28  GLUCOSE 113* 94  BUN 15 15  CREATININE 0.96 0.99    Recent Labs  10/11/13 1347  TROPONINI 12.52*    Recent Labs  10/11/13 0357  INR 0.95    Imaging: No CXR done this admission  Cardiac Studies:  Assessment/Plan:   Principal Problem:   NSTEMI (non-ST elevated myocardial infarction) Active Problems:   CAD-s/p PCI of LAD in 2010- now total with collaterals   Acute diastolic heart failure- EF 50%   OSA on CPAP   DYSLIPIDEMIA   HTN (hypertension)   PLAN: OK for DC later this PM, consider CXR before discharge. F/U in [place] office.              ? Low dose Nitrate at discharge  Kerin Ransom PA-C [place] [phone] 10/13/2013, 10:32 AM  Stable ~3 days post NSTEMI (2 days post Cath) - with now new occlusion of Mid LAD stent - no PCI as infarct appears completed.  Continue Plavix x 1 yr for ACS/NSTEMI EF remains stable Mild DHF by cath - is on lasix, BB, ACE-I -- agree with low dose Nitrate  BP & HR stable, he has ambulated without difficulty.  I agree that  he is ready for discharge today as opposed to the original plan for tomorrow.  He is doing well with no arrhythmias on stable medication regimen.  I think 1 day earlier is justified.  He will need ROV set up in [facility].   Leonie Man, M.D., M.S. Interventional Cardiologist   Pager # [phone] 10/13/2013

## 2013-10-13 NOTE — Discharge Summary (Signed)
Patient ID: [name],  MRN: [id number], DOB/AGE: February 19, 1951 63 y.o.  Admit date: 10/10/2013 Discharge date: 10/13/2013  Primary Care Provider:  Primary Cardiologist: Dr Domenic Polite (new)  Discharge Diagnoses Principal Problem:   NSTEMI (non-ST elevated myocardial infarction) Active Problems:   CAD-s/p PCI of LAD in 2010- now total with collaterals   Acute diastolic heart failure- EF 50%   OSA on CPAP   DYSLIPIDEMIA   HTN (hypertension)    Procedures: Coronary angiogram 10/11/13   Hospital Course:  The patient is a 63 year old, moderately obese white male, with a past medical history significant for CAD, status post PCI + stenting of the LAD in 2010. He also has a history of hypertension, remote tobacco abuse having quit 11 years ago and OSA, compliant with CPAP. He denies diabetes and hyperlipidemia. He initially presented to [facility], in [place], 10/10/13 with complaint of chest pain. His symptoms are similar to those he had in 2010 prior to undergoing PCI of his LAD. He presented to the [facility] ED. An EKG demonstrated borderline T-wave abnormalities in the inferior leads. His chest x-ray was unremarkable. Because of his history and ongoing pain, he was transferred to [facility] for further evaluation and treatment. His Troponin here was 12.52. He had CHF with anxiety that improved with diuresis. Cath done 10/11/13 revealed occlusion of the previously intervened on LAD with Rt-Lt collaterals. His EF was 50%. It was felt because of his late presentation, collateral circulation, and preserved EF- we would treat him medicaly. He was transferred to telelmetry and ambulated. Dr Ellyn Hack feels he can be discharged 10/13/13. He will follow up in the [place] office as he lives in [place].    Discharge Vitals:  Blood pressure 105/67, pulse 76, temperature 97.5 F (36.4 C), temperature source Oral, resp. rate 18, height '5\' 10"'  (1.778 m), weight 259 lb 0.7 oz (117.5 kg), SpO2 100.00%.     Labs: Results for orders placed during the hospital encounter of 10/10/13 (from the past 48 hour(s))  TROPONIN I     Status: Abnormal   Collection Time    10/11/13  1:47 PM      Result Value Ref Range   Troponin I 12.52 (*) <0.30 ng/mL   Comment:            Due to the release kinetics of cTnI,     a negative result within the first hours     of the onset of symptoms does not rule out     myocardial infarction with certainty.     If myocardial infarction is still suspected,     repeat the test at appropriate intervals.     CRITICAL RESULT CALLED TO, READ BACK BY AND VERIFIED WITH:     SCHLOENER J RN 10/11/13 1442 COSTELLO B  BASIC METABOLIC PANEL     Status: Abnormal   Collection Time    10/12/13  2:59 AM      Result Value Ref Range   Sodium 140  137 - 147 mEq/L   Potassium 4.7  3.7 - 5.3 mEq/L   Chloride 103  96 - 112 mEq/L   CO2 26  19 - 32 mEq/L   Glucose, Bld 113 (*) 70 - 99 mg/dL   BUN 15  6 - 23 mg/dL   Creatinine, Ser 0.96  0.50 - 1.35 mg/dL   Calcium 8.9  8.4 - 10.5 mg/dL   GFR calc non Af Amer 87 (*) >90 mL/min   GFR  calc Af Amer >90  >90 mL/min   Comment: (NOTE)     The eGFR has been calculated using the CKD EPI equation.     This calculation has not been validated in all clinical situations.     eGFR's persistently <90 mL/min signify possible Chronic Kidney     Disease.  BASIC METABOLIC PANEL     Status: Abnormal   Collection Time    10/13/13  3:10 AM      Result Value Ref Range   Sodium 142  137 - 147 mEq/L   Potassium 4.4  3.7 - 5.3 mEq/L   Chloride 101  96 - 112 mEq/L   CO2 28  19 - 32 mEq/L   Glucose, Bld 94  70 - 99 mg/dL   BUN 15  6 - 23 mg/dL   Creatinine, Ser 0.99  0.50 - 1.35 mg/dL   Calcium 9.2  8.4 - 10.5 mg/dL   GFR calc non Af Amer 86 (*) >90 mL/min   GFR calc Af Amer >90  >90 mL/min   Comment: (NOTE)     The eGFR has been calculated using the CKD EPI equation.     This calculation has not been validated in all clinical situations.      eGFR's persistently <90 mL/min signify possible Chronic Kidney     Disease.  CBC     Status: Abnormal   Collection Time    10/13/13  3:10 AM      Result Value Ref Range   WBC 9.4  4.0 - 10.5 K/uL   RBC 4.59  4.22 - 5.81 MIL/uL   Hemoglobin 16.0  13.0 - 17.0 g/dL   HCT 44.8  39.0 - 52.0 %   MCV 97.6  78.0 - 100.0 fL   MCH 34.9 (*) 26.0 - 34.0 pg   MCHC 35.7  30.0 - 36.0 g/dL   RDW 13.0  11.5 - 15.5 %   Platelets 236  150 - 400 K/uL    Disposition:  Follow-up Information   Follow up with Rozann Lesches, MD. (office will call you)    Specialty:  Cardiology   Contact information:   [place] [id number] [phone]       Discharge Medications:    Medication List    STOP taking these medications       ADVIL COLD/SINUS PO     HYDROmorphone 4 MG tablet  Commonly known as:  DILAUDID     isometheptene-acetaminophen-dichloralphenazone 65-325-100 MG capsule  Commonly known as:  MIDRIN      TAKE these medications       acetaminophen 500 MG tablet  Commonly known as:  TYLENOL  Take 500 mg by mouth every 8 (eight) hours as needed for headache.     aspirin EC 81 MG tablet  Take 81 mg by mouth every morning.     cholecalciferol 1000 UNITS tablet  Commonly known as:  VITAMIN D  Take 1,000 Units by mouth every morning.     clonazePAM 0.5 MG tablet  Commonly known as:  KLONOPIN  Take 0.5 mg by mouth 3 (three) times daily as needed for anxiety.     clopidogrel 75 MG tablet  Commonly known as:  PLAVIX  Take 1 tablet (75 mg total) by mouth daily with breakfast.     CO ENZYME Q-10 PO  Take 1 tablet by mouth every evening.     diphenhydrAMINE 25 MG tablet  Commonly known as:  BENADRYL  Take  25-50 mg by mouth 2 (two) times daily as needed for itching.     docusate sodium 100 MG capsule  Commonly known as:  COLACE  Take 100 mg by mouth daily as needed for mild constipation.     furosemide 40 MG tablet  Commonly known as:  LASIX  Take 40-80 mg by mouth 2  (two) times daily. Take 2 tablets in AM and 1 tablet in PM.     gabapentin 600 MG tablet  Commonly known as:  NEURONTIN  Take 1,800 mg by mouth 2 (two) times daily.     ibuprofen 200 MG tablet  Commonly known as:  ADVIL,MOTRIN  Take 600-800 mg by mouth every 6 (six) hours as needed (pain).     IMODIUM A-D PO  Take 3 capsules by mouth daily as needed (chronic diarrhea).     isosorbide mononitrate 30 MG 24 hr tablet  Commonly known as:  IMDUR  Take 1 tablet (30 mg total) by mouth daily.     L-Lysine 500 MG Tabs  Take 500 mg by mouth 2 (two) times daily.     metoprolol succinate 100 MG 24 hr tablet  Commonly known as:  TOPROL-XL  Take 100 mg by mouth daily. Take with or immediately following a meal.     naproxen 500 MG tablet  Commonly known as:  NAPROSYN  Take 500 mg by mouth 2 (two) times daily as needed (pain).     nitroGLYCERIN 0.4 MG SL tablet  Commonly known as:  NITROSTAT  Place 1 tablet (0.4 mg total) under the tongue every 5 (five) minutes as needed. For chest discomfort     omeprazole 40 MG capsule  Commonly known as:  PRILOSEC  Take 40 mg by mouth daily before breakfast.     OVER THE COUNTER MEDICATION  Take 1 tablet by mouth 2 (two) times daily. Tumeric     polyvinyl alcohol-povidone 1.4-0.6 % ophthalmic solution  Commonly known as:  HYPOTEARS  Place 1-2 drops into both eyes daily as needed (dry eyes). For dry eyes     potassium chloride 10 MEQ tablet  Commonly known as:  K-DUR  Take 10-20 mEq by mouth 2 (two) times daily. 2 tablets in AM and 1 tablet in PM     TESTOSTERONE CYPIONATE IM  Inject 0.8 mLs into the muscle every Friday.     vitamin C 500 MG tablet  Commonly known as:  ASCORBIC ACID  Take 500 mg by mouth every morning.         Duration of Discharge Encounter: Greater than 30 minutes including physician time.  Signed, Kerin Ransom PA-C 10/13/2013 11:57 AM  Stable ~3 days post NSTEMI (2 days post Cath) - with now new occlusion of Mid LAD  stent - no PCI as infarct appears completed.  Continue Plavix x 1 yr for ACS/NSTEMI EF remains stable  Mild DHF by cath - is on lasix, BB, ACE-I -- agree with low dose Nitrate  BP & HR stable, he has ambulated without difficulty.  I agree that he is ready for discharge today as opposed to the original plan for tomorrow. He is doing well with no arrhythmias on stable medication regimen. I think 1 day earlier is justified.  He will need ROV set up in [facility].   Leonie Man, M.D., M.S. Interventional Cardiologist   Pager # [phone] 10/13/2013

## 2013-10-13 NOTE — Discharge Instructions (Signed)
Myocardial Infarction  A myocardial infarction (MI) is damage to the heart that is not reversible. It is also called a heart attack. An MI usually occurs when a heart (coronary) artery becomes blocked or narrowed. This cuts off the blood supply to the heart. When one or more of the heart (coronary) arteries becomes blocked, that area of the heart begins to die. This causes pain felt during an MI.   If you think you might be having an MI, call your local emergency services immediately (911 in [place] It is recommended that you take a 162 mg non-enteric coated aspirin if you do not have an aspirin allergy. Do not drive yourself to the hospital or wait to see if your symptoms go away. The sooner MI is treated, the greater the amount of heart muscle saved. Time is muscle. It can save your life.  CAUSES   An MI can occur from:  · A gradual buildup of a fatty substance called plaque. When plaque builds up in the arteries, this condition is called atherosclerosis. This buildup can block or reduce the blood supply to the heart artery(s).  · A sudden plaque rupture within a heart artery that causes a blood clot (thrombus). A blood clot can block the heart artery which does not allow blood flow to the heart.  · A severe tightening (spasm) of the heart artery. This is a less common cause of a heart attack. When a heart artery spasms, it cuts off blood flow through the artery. Spasms can occur in heart arteries that do not have atherosclerosis.  RISK FACTORS  People at risk for an MI usually have one or more risk factors, such as:  · High blood pressure.  · High cholesterol.  · Smoking.  · Gender. Men have a higher heart attack risk.  · Overweight/obesity.  · Age.  · Family history.  · Lack of exercise.  · Diabetes.  · Stress.  · Excessive alcohol use.  · Street drug use (cocaine and methamphetamines).  SYMPTOMS   MI symptoms can vary, such as:  · In both men and women, MI symptoms can include the following:  · Chest pain. The  chest pain may feel like a crushing, squeezing, or "pressure" type feeling. MI pain can be "referred," meaning pain can be caused in one part of the body but felt in another part of the body. Referred MI pain may occur in the left arm, neck, or jaw. Pain may even be felt in the right arm.  · Shortness of breath (dyspnea).  · Heartburn or indigestion with or without vomiting, shortness of breath, or sweating (diaphoresis).  · Sudden, cold sweats.  · Sudden lightheadedness.  · Upper back pain.  · Women can have unique MI symptoms, such as:  · Unexplained feelings of nervousness or anxiety.  · Discomfort between the shoulder blades (scapula) or upper back.  · Tingling in the hands and arms.  · In elderly people (regardless of gender), MI symptoms can be subtle, such as:  · Sweating (diaphoresis).  · Shortness of breath (dyspnea).  · General tiredness (fatigue) or not feeling well (malaise).  DIAGNOSIS   Diagnosis of an MI involves several tests such as:  · An assessment of your vital signs such as heart rhythm, blood pressure, respiratory rate, and oxygen level.  · An EKG (ECG) to look at the electrical activity of your heart.  · Blood tests called cardiac markers are drawn at scheduled times to measure proteins or   enzymes released by the damaged heart muscle.  · A chest X-ray.  · An echocardiogram to evaluate heart motion and blood flow.  · Coronary angiography (cardiac catheterization). This is a diagnostic procedure to look at the heart arteries.  TREATMENT   Acute Intervention. For an MI, the national standard in the [place] is to have an acute intervention in under 90 minutes from the time you get to the hospital. An acute intervention is a special procedure to open up the heart arteries. It is done in a treatment room called a "catheterization lab" (cath lab). Some hospitals do no have a cath lab. If you are having an MI and the hospital does not have a cath lab, the standard is to transport you to a  hospital that has one. In the cath lab, acute intervention includes:  · Angioplasty. An angioplasty involves inserting a thin, flexible tube (catheter) into an artery in either your groin or wrist. The catheter is threaded to the heart arteries. A balloon at the end of the catheter is inflated to open a narrowed or blocked heart artery. During an angioplasty procedure, a small mesh tube (stent) may be used to keep the heart artery open. Depending on your condition and health history, one of two types of stents may be placed:  · Drug-eluting stent (DES). A DES is coated with a medicine to prevent scar tissue from growing over the stent. With drug-eluting stents, blood thinning medicine will need to be taken for up to a year.  · Bare metal stent. This type of stent has no special coating to keep tissue from growing over it. This type of stent is used if you cannot take blood thinning medicine for a prolonged time or you need surgery in the near future. After a bare metal stent is placed, blood thinning medicine will need to be taken for about a month.  · If you are taking blood thinning medicine (anti-platelet therapy) after stent placement, do not stop taking it unless your caregiver says it is okay to do so. Make sure you understand how long you need to take the medicine.  Surgical Intervention  · If an acute intervention is not successful, surgery may be needed:  · Open heart surgery (coronary artery bypass graft, CABG). CABG takes a vein (saphenous vein) from your leg. The vein is then attached to the blocked heart artery which bypasses the blockage. This then allows blood flow to the heart muscle.  Additional Interventions  · A "clot buster" medicine (thrombolytic) may be given. This medicine can help break up a clot in the heart artery. This medicine may be given if a person cannot get to a cath lab right away.  · Intra-aortic balloon pump (IABP). If you have suffered a very severe MI and are too unstable to go  to the cath lab or to surgery, an IABP may be used. This is a temporary mechanical device used to increase blood flow to the heart and reduce the workload of the heart until you are stable enough to go to the cath lab or surgery.  HOME CARE INSTRUCTIONS  After an MI, you may need the following:  · Medicine. Take medicine as directed by your caregiver. Medicines after an MI may:  · Keep your blood from clotting easily (blood thinners).  · Control your blood pressure.  · Help lower your cholesterol.  · Control abnormal heart rhythms.  · Lifestyle changes. Under the guidance of your caregiver, lifestyle changes   include:  · Quitting smoking, if you smoke. Your caregiver can help you quit.  · Being physically active.  · Maintaining a healthy weight.  · Eating a heart healthy diet. A dietitian can help you learn healthy eating options.  · Managing diabetes.  · Reducing stress.  · Limiting alcohol intake.  SEEK IMMEDIATE MEDICAL CARE IF:   · You have severe chest pain, especially if the pain is crushing or pressure-like and spreads to the arms, back, neck, or jaw. This is an emergency. Do not wait to see if the pain will go away. Get medical help at once. Call your local emergency services (911 in the [place] Do not drive yourself to the hospital.  · You have shortness of breath during rest, sleep, or with activity.  · You have sudden sweating or clammy skin.  · You feel sick to your stomach (nauseous) and throw up (vomit).  · You suddenly become lightheaded or dizzy.  · You feel your heart beating rapidly or you notice "skipped" beats.  MAKE SURE YOU:   · Understand these instructions.  · Will watch your condition.  · Will get help right away if you are not doing well or get worse.  Document Released: 04/28/2005 Document Revised: 01/21/2012 Document Reviewed: 10/01/2010  [employer]® Patient Information ©2014 [employer].

## 2013-10-13 NOTE — Progress Notes (Signed)
Discharge instructions reviewed with patient and wife, questions answered, verbalized understanding.  Patient transported to main entrance of hospital via wheelchair accompanied by wife.  Patient in good condition at time of discharge from [facility].

## 2013-10-21 ENCOUNTER — Ambulatory Visit (INDEPENDENT_AMBULATORY_CARE_PROVIDER_SITE_OTHER): Payer: Medicare FFS | Admitting: Cardiovascular Disease

## 2013-10-21 ENCOUNTER — Encounter: Payer: Self-pay | Admitting: Cardiovascular Disease

## 2013-10-21 VITALS — BP 117/73 | HR 64 | Ht 70.0 in | Wt 264.0 lb

## 2013-10-21 DIAGNOSIS — Z87898 Personal history of other specified conditions: Secondary | ICD-10-CM

## 2013-10-21 DIAGNOSIS — Z9289 Personal history of other medical treatment: Secondary | ICD-10-CM

## 2013-10-21 DIAGNOSIS — I2510 Atherosclerotic heart disease of native coronary artery without angina pectoris: Secondary | ICD-10-CM

## 2013-10-21 DIAGNOSIS — I252 Old myocardial infarction: Secondary | ICD-10-CM

## 2013-10-21 DIAGNOSIS — I10 Essential (primary) hypertension: Secondary | ICD-10-CM

## 2013-10-21 DIAGNOSIS — Z713 Dietary counseling and surveillance: Secondary | ICD-10-CM

## 2013-10-21 DIAGNOSIS — I5022 Chronic systolic (congestive) heart failure: Secondary | ICD-10-CM

## 2013-10-21 MED ORDER — NITROGLYCERIN 0.4 MG SL SUBL: 0.4000 mg | SUBLINGUAL_TABLET | SUBLINGUAL | Status: DC | PRN

## 2013-10-21 MED ORDER — CLOPIDOGREL BISULFATE 75 MG PO TABS: 75.0000 mg | ORAL_TABLET | Freq: Every day | ORAL | Status: DC

## 2013-10-21 MED ORDER — ISOSORBIDE MONONITRATE ER 30 MG PO TB24: 30.0000 mg | ORAL_TABLET | Freq: Every day | ORAL | Status: DC

## 2013-10-21 NOTE — Progress Notes (Signed)
Patient ID: [name], male   DOB: July 05, 1950, 63 y.o.   MRN: [id number]      SUBJECTIVE: The patient was recently hospitalized for a non-STEMI and congestive heart failure. Coronary angiography demonstrated a totally occluded LAD proximal to the previously placed mid LAD stent and after the first diagonal branch. There were right to left collaterals noted. The right coronary artery and circumflex branch were widely patent. Left ventricular systolic function was preserved, EF 50% by left ventriculography with wall motion abnormalities. He has been feeling well. He denies chest pain and shortness of breath. He may feel fatigued after he significantly exerts himself. He denies leg swelling. While he does not add salt to his foods, he occasionally binges and eats potato chips. He said that if he misses his Lasix, he will rapidly retain fluid in the course of 24 hours.    Allergies  Allergen Reactions  . Duloxetine Swelling    CHF  . Fentanyl Other (See Comments)    Confusion, panic attacks  . Lidocaine Other (See Comments)    REACTION: non-reactive  . Meperidine Hcl Other (See Comments)    REACTION: causes extreme mental reactions.  . Methadone Other (See Comments)    REACTION: causes heart failure  . Oxycodone Hcl Other (See Comments)    REACTION: causes heart failure  . Pregabalin Other (See Comments)    REACTION: chf  . Zolpidem Tartrate Other (See Comments)    REACTION: confusion    Current Outpatient Prescriptions  Medication Sig Dispense Refill  . acetaminophen (TYLENOL) 500 MG tablet Take 500 mg by mouth every 8 (eight) hours as needed for headache.      Marland Kitchen aspirin EC 81 MG tablet Take 81 mg by mouth every morning.      . cholecalciferol (VITAMIN D) 1000 UNITS tablet Take 1,000 Units by mouth every morning.      . clonazePAM (KLONOPIN) 0.5 MG tablet Take 0.5 mg by mouth 3 (three) times daily as needed for anxiety.      . clopidogrel (PLAVIX) 75 MG tablet Take 1 tablet (75  mg total) by mouth daily with breakfast.  30 tablet  11  . CO ENZYME Q-10 PO Take 1 tablet by mouth every evening.      . diphenhydrAMINE (BENADRYL) 25 MG tablet Take 25-50 mg by mouth 2 (two) times daily as needed for itching.       . docusate sodium (COLACE) 100 MG capsule Take 100 mg by mouth daily as needed for mild constipation.      . furosemide (LASIX) 40 MG tablet Take 40-80 mg by mouth 2 (two) times daily. Take 2 tablets in AM and 1 tablet in PM.      . gabapentin (NEURONTIN) 600 MG tablet Take 1,800 mg by mouth 2 (two) times daily.       Marland Kitchen ibuprofen (ADVIL,MOTRIN) 200 MG tablet Take 600-800 mg by mouth every 6 (six) hours as needed (pain).      . isosorbide mononitrate (IMDUR) 30 MG 24 hr tablet Take 1 tablet (30 mg total) by mouth daily.  30 tablet  11  . L-Lysine 500 MG TABS Take 500 mg by mouth 2 (two) times daily.       . Loperamide HCl (IMODIUM A-D PO) Take 3 capsules by mouth daily as needed (chronic diarrhea).      . metoprolol succinate (TOPROL-XL) 100 MG 24 hr tablet Take 100 mg by mouth daily. Take with or immediately following a meal.      .  naproxen (NAPROSYN) 500 MG tablet Take 500 mg by mouth 2 (two) times daily as needed (pain).       . nitroGLYCERIN (NITROSTAT) 0.4 MG SL tablet Place 1 tablet (0.4 mg total) under the tongue every 5 (five) minutes as needed. For chest discomfort  25 tablet  3  . omeprazole (PRILOSEC) 40 MG capsule Take 40 mg by mouth daily before breakfast.       . OVER THE COUNTER MEDICATION Take 1 tablet by mouth 2 (two) times daily. Tumeric      . polyvinyl alcohol-povidone (HYPOTEARS) 1.4-0.6 % ophthalmic solution Place 1-2 drops into both eyes daily as needed (dry eyes). For dry eyes      . potassium chloride (K-DUR) 10 MEQ tablet Take 10-20 mEq by mouth 2 (two) times daily. 2 tablets in AM and 1 tablet in PM      . TESTOSTERONE CYPIONATE IM Inject 0.8 mLs into the muscle every Friday.       . vitamin C (ASCORBIC ACID) 500 MG tablet Take 500 mg by  mouth every morning.        No current facility-administered medications for this visit.    Past Medical History  Diagnosis Date  . Coronary artery disease     STENT X 1 2010 Mid LAD  . Neuropathy   . Fibromyalgia   . Penile lesion   . GERD (gastroesophageal reflux disease)   . History of kidney stones   . Sleep apnea     USES C-PAP  . Anxiety     HX PANIC ATTACKS  . History of vertigo   . Sjogren's disease   . Narcolepsy   . Headache(784.0)   . Chronic cough   . Dry eyes   . Fluid retention   . Hypertension   . CHF (congestive heart failure)     Past Surgical History  Procedure Laterality Date  . Meatotomy  X2  . Hernia repair      ING HERNIA REPAIR  . Circumcision    . Vasectomy    . Breast surgery      BIL MASTECTOMY  FOR BENIGN LUMPS  . Carpal tunnell  BIL  . Wrist fracture surgery  L  X3 SURGERIES  . Coronary angioplasty with stent placement  X1 2010    History   Social History  . Marital Status: Married    Spouse Name: N/A    Number of Children: N/A  . Years of Education: N/A   Occupational History  . Not on file.   Social History Main Topics  . Smoking status: Former Smoker    Quit date: 05/12/2000  . Smokeless tobacco: Not on file  . Alcohol Use: No     Comment: RECOVERING ALCOHOLIC 9 YRS  . Drug Use: No  . Sexual Activity:    Other Topics Concern  . Not on file   Social History Narrative  . No narrative on file     Filed Vitals:   10/21/13 1510  Height: 5\' 10"  (1.778 m)    PHYSICAL EXAM General: NAD Neck: No JVD, no thyromegaly. Lungs: Clear to auscultation bilaterally with normal respiratory effort. CV: Nondisplaced PMI.  Regular rate and rhythm, normal S1/S2, no S3/S4, no murmur. No pretibial or periankle edema.  No carotid bruit.  Normal pedal pulses.  Abdomen: Soft, nontender, no hepatosplenomegaly, no distention.  Neurologic: Alert and oriented x 3.  Psych: Normal affect. Extremities: No clubbing or cyanosis.   ECG:  reviewed and available in  electronic records.      ASSESSMENT AND PLAN:  CORONARY ARTERY DISEASE -  As stated previously, his LAD is now occluded proximal to the previously placed stent with right to left collaterals and preserved left ventricular systolic function, EF 13%. Will continue optimal medical therapy. If there is an inability to control his symptoms with optimal medical therapy, recannulization of the LAD could theoretically be pursued. Continue aspirin, Plavix, Imdur, and Toprol XL. Will make a referral to cardiac rehabilitation. I've asked him to monitor his blood pressure and heart rate. If his heart rate gets too low and is accompanied by fatigue, I would reduce the dose of Toprol-XL  ESSENTIAL HYPERTENSION, BENIGN -  His blood pressure is well controlled.  Chronic systolic heart failure, EF 50% Euvolemic currently, on Lasix 80 mg q am and 40 mg q pm. Dietary counseling given specifically with regards to sodium restriction.  Dispo: f/u in September.  Kate Sable, M.D., F.A.C.C.

## 2013-10-21 NOTE — Patient Instructions (Signed)
Continue all current medications. Referral to Cardiac Rehab - [facility] Follow up in  3 months

## 2014-01-17 ENCOUNTER — Telehealth: Payer: Self-pay | Admitting: *Deleted

## 2014-01-17 NOTE — Telephone Encounter (Signed)
Message left on voice mail - has dentist appointment tomorrow & wanted to make sure okay to go.    Returned call - states he is going tomorrow for routine cleaning.  Informed him this would be okay.  Does not need prophylactic ABO as he has no history of bacterial endocarditis or artificial valves.  If plan to have extractions, usually require note from dentist stating procedure & requesting cardiac clearance since on ASA & Plavix.  Patient verbalized understanding.

## 2014-02-02 ENCOUNTER — Ambulatory Visit (INDEPENDENT_AMBULATORY_CARE_PROVIDER_SITE_OTHER): Payer: Medicare FFS | Admitting: Cardiovascular Disease

## 2014-02-02 ENCOUNTER — Encounter: Payer: Self-pay | Admitting: Cardiovascular Disease

## 2014-02-02 VITALS — BP 133/81 | HR 65 | Ht 69.0 in | Wt 260.0 lb

## 2014-02-02 DIAGNOSIS — E785 Hyperlipidemia, unspecified: Secondary | ICD-10-CM

## 2014-02-02 DIAGNOSIS — I10 Essential (primary) hypertension: Secondary | ICD-10-CM

## 2014-02-02 DIAGNOSIS — I209 Angina pectoris, unspecified: Secondary | ICD-10-CM

## 2014-02-02 DIAGNOSIS — I252 Old myocardial infarction: Secondary | ICD-10-CM

## 2014-02-02 DIAGNOSIS — I2510 Atherosclerotic heart disease of native coronary artery without angina pectoris: Secondary | ICD-10-CM

## 2014-02-02 DIAGNOSIS — I5022 Chronic systolic (congestive) heart failure: Secondary | ICD-10-CM

## 2014-02-02 DIAGNOSIS — I25118 Atherosclerotic heart disease of native coronary artery with other forms of angina pectoris: Secondary | ICD-10-CM

## 2014-02-02 MED ORDER — LISINOPRIL 5 MG PO TABS
5.0000 mg | ORAL_TABLET | Freq: Every day | ORAL | Status: DC
Start: 2014-02-02 — End: 2014-03-17

## 2014-02-02 MED ORDER — ISOSORBIDE MONONITRATE ER 60 MG PO TB24: 60.0000 mg | ORAL_TABLET | Freq: Every day | ORAL | Status: DC

## 2014-02-02 MED ORDER — ATORVASTATIN CALCIUM 40 MG PO TABS
40.0000 mg | ORAL_TABLET | Freq: Every day | ORAL | Status: DC
Start: 2014-02-02 — End: 2014-03-17

## 2014-02-02 NOTE — Patient Instructions (Signed)
   Begin Lipitor 40mg  daily   Begin Lisinopril 5mg  daily  Above medications sent to local pharmacy.  If tolerate, can send to mail order when you prefer.  Increase Imdur to 60mg  daily - new sent to mail order for 90 day  Continue all other medications.   Labs for fasting lipids & liver function - due in 3 months - will send in mail  Office will contact with results via phone or letter.   Follow up in  6-8 weeks

## 2014-02-02 NOTE — Progress Notes (Signed)
Patient ID: [name], male   DOB: 1950-10-23, 63 y.o.   MRN: [id number]      SUBJECTIVE: The patient returns for follow up of CAD and CHF. He was hospitalized in 10/2013 for a late-presenting anterior wall non-STEMI and congestive heart failure. Coronary angiography demonstrated a totally occluded LAD proximal to the previously placed mid LAD stent and after the first diagonal branch. There were right to left collaterals noted. The right coronary artery and circumflex branch were widely patent. Left ventricular systolic function was preserved, EF 50% by left ventriculography with wall motion abnormalities. Medical management was recommended but LAD recanalization would be considered if chest pain could not be controlled.  He says his energy levels are diminished and he gets dyspneic with exertion fairly frequently. He tries to stay active. He has chest pain on a fairly regular basis but not enough to use nitroglycerin daily. He has used it once or twice since his last visit with me. He is neither on an ACE inhibitor nor statin therapy. He took himself off statins in the past. He has not had any leg swelling.    Review of Systems: As per "subjective", otherwise negative.  Allergies  Allergen Reactions  . Duloxetine Swelling    CHF  . Fentanyl Other (See Comments)    Confusion, panic attacks  . Lidocaine Other (See Comments)    REACTION: non-reactive  . Meperidine Hcl Other (See Comments)    REACTION: causes extreme mental reactions.  . Methadone Other (See Comments)    REACTION: causes heart failure  . Oxycodone Hcl Other (See Comments)    REACTION: causes heart failure  . Pregabalin Other (See Comments)    REACTION: chf  . Zolpidem Tartrate Other (See Comments)    REACTION: confusion    Current Outpatient Prescriptions  Medication Sig Dispense Refill  . acetaminophen (TYLENOL) 500 MG tablet Take 500 mg by mouth every 8 (eight) hours as needed for headache.      Marland Kitchen aspirin EC  81 MG tablet Take 81 mg by mouth every morning.      . cholecalciferol (VITAMIN D) 1000 UNITS tablet Take 1,000 Units by mouth every morning.      . clonazePAM (KLONOPIN) 0.5 MG tablet Take 0.5 mg by mouth 3 (three) times daily as needed for anxiety.      . clopidogrel (PLAVIX) 75 MG tablet Take 1 tablet (75 mg total) by mouth daily with breakfast.  90 tablet  3  . clotrimazole-betamethasone (LOTRISONE) cream Apply 1 application topically as needed.      . CO ENZYME Q-10 PO Take 1 tablet by mouth every evening.      . diphenhydrAMINE (BENADRYL) 25 MG tablet Take 25-50 mg by mouth 2 (two) times daily as needed for itching.       . furosemide (LASIX) 40 MG tablet Take 40-80 mg by mouth 2 (two) times daily. Take 2 tablets in AM and 1 tablet in PM.      . gabapentin (NEURONTIN) 600 MG tablet Take 1,800 mg by mouth 2 (two) times daily.       Marland Kitchen guaifenesin (HUMIBID E) 400 MG TABS tablet Take 1,200 mg by mouth every 4 (four) hours.      Marland Kitchen HYDROmorphone (DILAUDID) 4 MG tablet Take 1 tablet by mouth 2 (two) times daily.      Marland Kitchen isometheptene-acetaminophen-dichloralphenazone (MIDRIN) 65-325-100 MG capsule Take 3-4 capsules by mouth as directed. Maximum 5 capsules in 12 hours for migraine headaches, 8 capsules in  24 hours for tension headaches.      . isosorbide mononitrate (IMDUR) 30 MG 24 hr tablet Take 1 tablet (30 mg total) by mouth daily.  90 tablet  3  . L-Lysine 500 MG TABS Take 500 mg by mouth 2 (two) times daily.       . Loperamide HCl (IMODIUM A-D PO) Take 3 capsules by mouth daily as needed (chronic diarrhea).      . metoprolol succinate (TOPROL-XL) 100 MG 24 hr tablet Take 100 mg by mouth daily. Take with or immediately following a meal.      . nitroGLYCERIN (NITROSTAT) 0.4 MG SL tablet Place 1 tablet (0.4 mg total) under the tongue every 5 (five) minutes as needed. For chest discomfort  25 tablet  3  . NON FORMULARY CPAP      . omeprazole (PRILOSEC) 40 MG capsule Take 40 mg by mouth daily before  breakfast.       . OVER THE COUNTER MEDICATION Take 1 tablet by mouth 2 (two) times daily. Tumeric      . polyethylene glycol (MIRALAX / GLYCOLAX) packet Take 17 g by mouth daily as needed.      . polyvinyl alcohol-povidone (HYPOTEARS) 1.4-0.6 % ophthalmic solution Place 1-2 drops into both eyes daily as needed (dry eyes). For dry eyes      . potassium chloride (K-DUR) 10 MEQ tablet Take 10-20 mEq by mouth 2 (two) times daily. 2 tablets in AM and 1 tablet in PM      . TESTOSTERONE CYPIONATE IM Inject 0.8 mLs into the muscle every Friday.       . vitamin C (ASCORBIC ACID) 500 MG tablet Take 500 mg by mouth every morning.        No current facility-administered medications for this visit.    Past Medical History  Diagnosis Date  . Coronary artery disease     STENT X 1 2010 Mid LAD  . Neuropathy   . Fibromyalgia   . Penile lesion   . GERD (gastroesophageal reflux disease)   . History of kidney stones   . Sleep apnea     USES C-PAP  . Anxiety     HX PANIC ATTACKS  . History of vertigo   . Sjogren's disease   . Narcolepsy   . Headache(784.0)   . Chronic cough   . Dry eyes   . Fluid retention   . Hypertension   . CHF (congestive heart failure)     Past Surgical History  Procedure Laterality Date  . Meatotomy  X2  . Hernia repair      ING HERNIA REPAIR  . Circumcision    . Vasectomy    . Breast surgery      BIL MASTECTOMY  FOR BENIGN LUMPS  . Carpal tunnell  BIL  . Wrist fracture surgery  L  X3 SURGERIES  . Coronary angioplasty with stent placement  X1 2010    History   Social History  . Marital Status: Married    Spouse Name: N/A    Number of Children: N/A  . Years of Education: N/A   Occupational History  . Not on file.   Social History Main Topics  . Smoking status: Former Smoker    Quit date: 05/12/2000  . Smokeless tobacco: Never Used  . Alcohol Use: No     Comment: RECOVERING ALCOHOLIC 9 YRS  . Drug Use: No  . Sexual Activity: Not on file   Other  Topics Concern  .  Not on file   Social History Narrative  . No narrative on file    BP 133/81 Pulse 65  Resp Temp Temp src SpO2 98% Weight 260 lb (117.935 kg) Height 5\' 9"  (1.753 m)    PHYSICAL EXAM General: NAD HEENT: Normal. Neck: No JVD, no thyromegaly. Lungs: Clear to auscultation bilaterally with normal respiratory effort. CV: Nondisplaced PMI.  Regular rate and rhythm, normal S1/S2, no S3/S4, no murmur. No pretibial or periankle edema.  No carotid bruit.  Normal pedal pulses.  Abdomen: Soft, nontender, no hepatosplenomegaly, no distention.  Neurologic: Alert and oriented x 3.  Psych: Normal affect. Skin: Normal. Musculoskeletal: Normal range of motion, no gross deformities. Extremities: No clubbing or cyanosis.   ECG: Most recent ECG reviewed.      ASSESSMENT AND PLAN:  CORONARY ARTERY DISEASE -  As stated previously, his LAD is now occluded proximal to the previously placed stent with right to left collaterals and preserved left ventricular systolic function, EF 84%. Will continue optimal medical therapy and will increase Imdur to 60 mg daily and start lisinopril 5 mg daily and Lipitor 40 mg daily. If there is an inability to control his symptoms with optimal medical therapy, recannulization of the LAD could theoretically be pursued. Continue aspirin, Plavix and Toprol XL.   ESSENTIAL HYPERTENSION, BENIGN -  His blood pressure is well controlled.   Chronic systolic heart failure, EF 50%  Euvolemic currently, on Lasix 80 mg q am and 40 mg q pm. On beta blocker, will start ACEI.  Hyperlipidemia Start Lipitor 40 mg daily and check lipids and LFT's in 3 months.  Dispo: f/u 6-8 weeks.   Kate Sable, M.D., F.A.C.C.

## 2014-03-17 ENCOUNTER — Encounter: Payer: Self-pay | Admitting: Cardiovascular Disease

## 2014-03-17 ENCOUNTER — Ambulatory Visit (INDEPENDENT_AMBULATORY_CARE_PROVIDER_SITE_OTHER): Payer: Medicare FFS | Admitting: Cardiovascular Disease

## 2014-03-17 VITALS — BP 115/70 | HR 86 | Ht 69.5 in | Wt 264.0 lb

## 2014-03-17 DIAGNOSIS — R0602 Shortness of breath: Secondary | ICD-10-CM

## 2014-03-17 DIAGNOSIS — E785 Hyperlipidemia, unspecified: Secondary | ICD-10-CM

## 2014-03-17 DIAGNOSIS — I10 Essential (primary) hypertension: Secondary | ICD-10-CM

## 2014-03-17 DIAGNOSIS — I25118 Atherosclerotic heart disease of native coronary artery with other forms of angina pectoris: Secondary | ICD-10-CM

## 2014-03-17 DIAGNOSIS — I5022 Chronic systolic (congestive) heart failure: Secondary | ICD-10-CM

## 2014-03-17 MED ORDER — LISINOPRIL 5 MG PO TABS: 5.0000 mg | ORAL_TABLET | Freq: Every day | ORAL | Status: DC

## 2014-03-17 MED ORDER — CLOPIDOGREL BISULFATE 75 MG PO TABS: 75.0000 mg | ORAL_TABLET | Freq: Every day | ORAL | Status: DC

## 2014-03-17 MED ORDER — ATORVASTATIN CALCIUM 40 MG PO TABS: 40.0000 mg | ORAL_TABLET | Freq: Every day | ORAL | Status: DC

## 2014-03-17 MED ORDER — ISOSORBIDE MONONITRATE ER 60 MG PO TB24: 60.0000 mg | ORAL_TABLET | Freq: Every day | ORAL | Status: DC

## 2014-03-17 NOTE — Patient Instructions (Signed)
Continue all current medications. Follow up in  3 months 

## 2014-03-17 NOTE — Progress Notes (Signed)
Patient ID: [name], male   DOB: 1950/07/03, 63 y.o.   MRN: [id number]      SUBJECTIVE: The patient returns for follow up of CAD and CHF. He was hospitalized in 10/2013 for a late-presenting anterior wall non-STEMI and congestive heart failure. Coronary angiography demonstrated a totally occluded LAD proximal to the previously placed mid LAD stent and after the first diagonal branch. There were right to left collaterals noted. The right coronary artery and circumflex branch were widely patent. Left ventricular systolic function was preserved, EF 50% by left ventriculography with wall motion abnormalities. Medical management was recommended but LAD recanalization would be considered if chest pain could not be controlled. At his last visit, I increased Imdur to 60 mg daily and started lisinopril and Lipitor. He is now feeling very well and his shortness of breath has improved considerably and his energy levels have increased. He said he feels much better. He very seldom has chest pains which he describes as sharp and only last seconds. He is going to have an epidural next Friday and he stopped taking aspirin and Plavix this past Wednesday.  Review of Systems: As per "subjective", otherwise negative.  Allergies  Allergen Reactions  . Duloxetine Swelling    CHF  . Fentanyl Other (See Comments)    Confusion, panic attacks  . Lidocaine Other (See Comments)    REACTION: non-reactive  . Meperidine Hcl Other (See Comments)    REACTION: causes extreme mental reactions.  . Methadone Other (See Comments)    REACTION: causes heart failure  . Oxycodone Hcl Other (See Comments)    REACTION: causes heart failure  . Pregabalin Other (See Comments)    REACTION: chf  . Zolpidem Tartrate Other (See Comments)    REACTION: confusion    Current Outpatient Prescriptions  Medication Sig Dispense Refill  . aspirin EC 81 MG tablet Take 81 mg by mouth every morning.    Marland Kitchen atorvastatin (LIPITOR) 40 MG  tablet Take 1 tablet (40 mg total) by mouth daily. 30 tablet 3  . cholecalciferol (VITAMIN D) 1000 UNITS tablet Take 1,000 Units by mouth every morning.    . clonazePAM (KLONOPIN) 0.5 MG tablet Take 0.5 mg by mouth 3 (three) times daily as needed for anxiety.    . clopidogrel (PLAVIX) 75 MG tablet Take 1 tablet (75 mg total) by mouth daily with breakfast. 90 tablet 3  . clotrimazole-betamethasone (LOTRISONE) cream Apply 1 application topically as needed.    . CO ENZYME Q-10 PO Take 1 tablet by mouth every evening.    . diphenhydrAMINE (BENADRYL) 25 MG tablet Take 25-50 mg by mouth 2 (two) times daily as needed for itching.     . furosemide (LASIX) 40 MG tablet Take 40-80 mg by mouth 2 (two) times daily. Take 2 tablets in AM and 1 tablet in PM.    . gabapentin (NEURONTIN) 600 MG tablet Take 1,800 mg by mouth 2 (two) times daily.     Marland Kitchen guaifenesin (HUMIBID E) 400 MG TABS tablet Take 1,200 mg by mouth every 4 (four) hours.    Marland Kitchen HYDROmorphone (DILAUDID) 4 MG tablet Take 1 tablet by mouth 2 (two) times daily.    Marland Kitchen isometheptene-acetaminophen-dichloralphenazone (MIDRIN) 65-325-100 MG capsule Take 3-4 capsules by mouth as directed. Maximum 5 capsules in 12 hours for migraine headaches, 8 capsules in 24 hours for tension headaches.    . isosorbide mononitrate (IMDUR) 60 MG 24 hr tablet Take 1 tablet (60 mg total) by mouth daily. 90 tablet  3  . L-Lysine 500 MG TABS Take 500 mg by mouth 2 (two) times daily.     Marland Kitchen lisinopril (PRINIVIL,ZESTRIL) 5 MG tablet Take 1 tablet (5 mg total) by mouth daily. 30 tablet 3  . Loperamide HCl (IMODIUM A-D PO) Take 3 capsules by mouth daily as needed (chronic diarrhea).    . metoprolol succinate (TOPROL-XL) 100 MG 24 hr tablet Take 100 mg by mouth daily. Take with or immediately following a meal.    . nitroGLYCERIN (NITROSTAT) 0.4 MG SL tablet Place 1 tablet (0.4 mg total) under the tongue every 5 (five) minutes as needed. For chest discomfort 25 tablet 3  . NON FORMULARY  CPAP    . omeprazole (PRILOSEC) 40 MG capsule Take 40 mg by mouth daily before breakfast.     . OVER THE COUNTER MEDICATION Take 1 tablet by mouth 2 (two) times daily. Tumeric    . polyethylene glycol (MIRALAX / GLYCOLAX) packet Take 17 g by mouth daily as needed.    . polyvinyl alcohol-povidone (HYPOTEARS) 1.4-0.6 % ophthalmic solution Place 1-2 drops into both eyes daily as needed (dry eyes). For dry eyes    . potassium chloride (K-DUR) 10 MEQ tablet Take 10-20 mEq by mouth 2 (two) times daily. 2 tablets in AM and 1 tablet in PM    . vitamin C (ASCORBIC ACID) 500 MG tablet Take 500 mg by mouth every morning.      No current facility-administered medications for this visit.    Past Medical History  Diagnosis Date  . Coronary artery disease     STENT X 1 2010 Mid LAD  . Neuropathy   . Fibromyalgia   . Penile lesion   . GERD (gastroesophageal reflux disease)   . History of kidney stones   . Sleep apnea     USES C-PAP  . Anxiety     HX PANIC ATTACKS  . History of vertigo   . Sjogren's disease   . Narcolepsy   . Headache(784.0)   . Chronic cough   . Dry eyes   . Fluid retention   . Hypertension   . CHF (congestive heart failure)     Past Surgical History  Procedure Laterality Date  . Meatotomy  X2  . Hernia repair      ING HERNIA REPAIR  . Circumcision    . Vasectomy    . Breast surgery      BIL MASTECTOMY  FOR BENIGN LUMPS  . Carpal tunnell  BIL  . Wrist fracture surgery  L  X3 SURGERIES  . Coronary angioplasty with stent placement  X1 2010    History   Social History  . Marital Status: Married    Spouse Name: N/A    Number of Children: N/A  . Years of Education: N/A   Occupational History  . Not on file.   Social History Main Topics  . Smoking status: Former Smoker    Start date: 04/17/1967    Quit date: 05/12/2000  . Smokeless tobacco: Never Used  . Alcohol Use: No     Comment: RECOVERING ALCOHOLIC 9 YRS- 8588  . Drug Use: No  . Sexual Activity: Not  on file   Other Topics Concern  . Not on file   Social History Narrative     Filed Vitals:   03/17/14 1600  BP: 115/70  Pulse: 86  Height: 5' 9.5" (1.765 m)  Weight: 264 lb (119.75 kg)  SpO2: 96%    PHYSICAL EXAM General: NAD  HEENT: Normal. Neck: No JVD, no thyromegaly. Lungs: Clear to auscultation bilaterally with normal respiratory effort. CV: Nondisplaced PMI.  Regular rate and rhythm, normal S1/S2, no S3/S4, no murmur. No pretibial or periankle edema.  No carotid bruit.  Normal pedal pulses.  Abdomen: Soft, nontender, no hepatosplenomegaly, no distention.  Neurologic: Alert and oriented x 3.  Psych: Normal affect. Skin: Normal. Musculoskeletal: Normal range of motion, no gross deformities. Extremities: No clubbing or cyanosis.   ECG: Most recent ECG reviewed.      ASSESSMENT AND PLAN:  CORONARY ARTERY DISEASE -  As stated previously, his LAD is now occluded proximal to the previously placed stent with right to left collaterals and preserved left ventricular systolic function, EF 66%. Will continue optimal medical therapy with Imdur 60 mg daily, lisinopril 5 mg daily, and Lipitor 40 mg daily. If there is an inability to control his symptoms with optimal medical therapy, recannulization of the LAD could theoretically be pursued. Continue aspirin, Plavix and Toprol XL. ASA and Plavix to be resumed after epidural.  ESSENTIAL HYPERTENSION, BENIGN -  His blood pressure is well controlled. No changes to therapy.  Chronic systolic heart failure, EF 50%  Euvolemic currently, on Lasix 80 mg q am and 40 mg q pm. On beta blocker and ACEI.  Hyperlipidemia Continue Lipitor 40 mg daily. Will obtain copy of lipids done recently by Dr. Nadara Mustard (PCP).  Dispo: f/u 2 months.   Kate Sable, M.D., F.A.C.C.

## 2014-04-20 ENCOUNTER — Encounter (HOSPITAL_COMMUNITY): Payer: Self-pay | Admitting: Interventional Cardiology

## 2014-06-07 ENCOUNTER — Encounter: Payer: Self-pay | Admitting: *Deleted

## 2014-06-19 ENCOUNTER — Other Ambulatory Visit: Payer: Self-pay | Admitting: Cardiovascular Disease

## 2014-06-19 ENCOUNTER — Ambulatory Visit (INDEPENDENT_AMBULATORY_CARE_PROVIDER_SITE_OTHER): Payer: Medicare FFS | Admitting: Cardiovascular Disease

## 2014-06-19 ENCOUNTER — Encounter: Payer: Self-pay | Admitting: *Deleted

## 2014-06-19 ENCOUNTER — Encounter: Payer: Self-pay | Admitting: Cardiovascular Disease

## 2014-06-19 ENCOUNTER — Telehealth: Payer: Self-pay | Admitting: Cardiovascular Disease

## 2014-06-19 VITALS — BP 122/80 | HR 63 | Ht 69.0 in | Wt 254.0 lb

## 2014-06-19 DIAGNOSIS — R5383 Other fatigue: Secondary | ICD-10-CM

## 2014-06-19 DIAGNOSIS — I5022 Chronic systolic (congestive) heart failure: Secondary | ICD-10-CM

## 2014-06-19 DIAGNOSIS — R0602 Shortness of breath: Secondary | ICD-10-CM

## 2014-06-19 DIAGNOSIS — E785 Hyperlipidemia, unspecified: Secondary | ICD-10-CM

## 2014-06-19 DIAGNOSIS — I255 Ischemic cardiomyopathy: Secondary | ICD-10-CM

## 2014-06-19 DIAGNOSIS — I209 Angina pectoris, unspecified: Secondary | ICD-10-CM

## 2014-06-19 DIAGNOSIS — I25119 Atherosclerotic heart disease of native coronary artery with unspecified angina pectoris: Secondary | ICD-10-CM

## 2014-06-19 DIAGNOSIS — I10 Essential (primary) hypertension: Secondary | ICD-10-CM

## 2014-06-19 MED ORDER — LOSARTAN POTASSIUM 25 MG PO TABS: 25.0000 mg | ORAL_TABLET | Freq: Every day | ORAL | Status: DC

## 2014-06-19 NOTE — Telephone Encounter (Signed)
Left heart cath [facility]ib[facility]ings - Wednesday, 2/10 - 81:85 Checking percert

## 2014-06-19 NOTE — Progress Notes (Signed)
Patient ID: [name], male   DOB: 09-08-1950, 64 y.o.   MRN: [id number]      SUBJECTIVE: The patient returns for follow up of coronary artery disease and chronic systolic and diastolic heart failure. He was hospitalized in 10/2013 for a late-presenting anterior wall non-STEMI and congestive heart failure. Coronary angiography demonstrated a totally occluded LAD proximal to the previously placed mid LAD stent and after the first diagonal branch. There were right to left collaterals noted. The right coronary artery and circumflex branch were widely patent. Left ventricular systolic function was preserved, EF 50% by left ventriculography with wall motion abnormalities. Medical management was recommended but LAD recanalization would be considered if chest pain could not be controlled.  On January 14 he was exercising and felt sharp chest pains and had to sit down on the couch. It radiated to the left side of his neck and down his left arm. He was diaphoretic and short of breath. He took 3 sublingual nitroglycerin to alleviate the pain. Since that time he becomes exhausted with minimal exertion including activities like washing the dishes and doing laundry. He has been having chest pains 3 or 4 times per week and he is markedly fatigued. He is here with his wife, [name], who is a Marine scientist and is tearful when describing the decline of her husband's symptoms.   Review of Systems: As per "subjective", otherwise negative.  Allergies  Allergen Reactions  . Duloxetine Swelling    CHF  . Fentanyl Other (See Comments)    Confusion, panic attacks  . Lidocaine Other (See Comments)    REACTION: non-reactive  . Meperidine Hcl Other (See Comments)    REACTION: causes extreme mental reactions.  . Methadone Other (See Comments)    REACTION: causes heart failure  . Oxycodone Hcl Other (See Comments)    REACTION: causes heart failure  . Pregabalin Other (See Comments)    REACTION: chf  . Zolpidem Tartrate  Other (See Comments)    REACTION: confusion    Current Outpatient Prescriptions  Medication Sig Dispense Refill  . aspirin EC 81 MG tablet Take 81 mg by mouth every morning.    Marland Kitchen atorvastatin (LIPITOR) 40 MG tablet Take 1 tablet (40 mg total) by mouth daily. 90 tablet 3  . cholecalciferol (VITAMIN D) 1000 UNITS tablet Take 1,000 Units by mouth every morning.    . clonazePAM (KLONOPIN) 0.5 MG tablet Take 0.5 mg by mouth 3 (three) times daily as needed for anxiety.    . clopidogrel (PLAVIX) 75 MG tablet Take 1 tablet (75 mg total) by mouth daily with breakfast. 90 tablet 3  . clotrimazole-betamethasone (LOTRISONE) cream Apply 1 application topically as needed.    . CO ENZYME Q-10 PO Take 1 tablet by mouth every evening.    . diphenhydrAMINE (BENADRYL) 25 MG tablet Take 25-50 mg by mouth 2 (two) times daily as needed for itching.     . furosemide (LASIX) 40 MG tablet Take 40-80 mg by mouth 2 (two) times daily. Take 2 tablets in AM and 1 tablet in PM.    . gabapentin (NEURONTIN) 600 MG tablet Take 1,800 mg by mouth 2 (two) times daily.     Marland Kitchen guaifenesin (HUMIBID E) 400 MG TABS tablet Take 1,200 mg by mouth every 4 (four) hours.    Marland Kitchen HYDROmorphone (DILAUDID) 4 MG tablet Take 1 tablet by mouth 2 (two) times daily.    Marland Kitchen isometheptene-acetaminophen-dichloralphenazone (MIDRIN) 65-325-100 MG capsule Take 3-4 capsules by mouth as directed. Maximum  5 capsules in 12 hours for migraine headaches, 8 capsules in 24 hours for tension headaches.    . isosorbide mononitrate (IMDUR) 60 MG 24 hr tablet Take 1 tablet (60 mg total) by mouth daily. 90 tablet 3  . L-Lysine 500 MG TABS Take 500 mg by mouth 2 (two) times daily.     Marland Kitchen lisinopril (PRINIVIL,ZESTRIL) 5 MG tablet Take 1 tablet (5 mg total) by mouth daily. 90 tablet 3  . Loperamide HCl (IMODIUM A-D PO) Take 3 capsules by mouth daily as needed (chronic diarrhea).    . metoprolol succinate (TOPROL-XL) 100 MG 24 hr tablet Take 100 mg by mouth daily. Take with or  immediately following a meal.    . nitroGLYCERIN (NITROSTAT) 0.4 MG SL tablet Place 1 tablet (0.4 mg total) under the tongue every 5 (five) minutes as needed. For chest discomfort 25 tablet 3  . NON FORMULARY CPAP    . omeprazole (PRILOSEC) 40 MG capsule Take 40 mg by mouth daily before breakfast.     . OVER THE COUNTER MEDICATION Take 1 tablet by mouth 2 (two) times daily. Tumeric    . polyethylene glycol (MIRALAX / GLYCOLAX) packet Take 17 g by mouth daily as needed.    . polyvinyl alcohol-povidone (HYPOTEARS) 1.4-0.6 % ophthalmic solution Place 1-2 drops into both eyes daily as needed (dry eyes). For dry eyes    . potassium chloride (K-DUR) 10 MEQ tablet Take 10-20 mEq by mouth 2 (two) times daily. 2 tablets in AM and 1 tablet in PM    . vitamin C (ASCORBIC ACID) 500 MG tablet Take 500 mg by mouth every morning.      No current facility-administered medications for this visit.    Past Medical History  Diagnosis Date  . Coronary artery disease     STENT X 1 2010 Mid LAD  . Neuropathy   . Fibromyalgia   . Penile lesion   . GERD (gastroesophageal reflux disease)   . History of kidney stones   . Sleep apnea     USES C-PAP  . Anxiety     HX PANIC ATTACKS  . History of vertigo   . Sjogren's disease   . Narcolepsy   . Headache(784.0)   . Chronic cough   . Dry eyes   . Fluid retention   . Hypertension   . CHF (congestive heart failure)     Past Surgical History  Procedure Laterality Date  . Meatotomy  X2  . Hernia repair      ING HERNIA REPAIR  . Circumcision    . Vasectomy    . Breast surgery      BIL MASTECTOMY  FOR BENIGN LUMPS  . Carpal tunnell  BIL  . Wrist fracture surgery  L  X3 SURGERIES  . Coronary angioplasty with stent placement  X1 2010  . Left heart catheterization with coronary angiogram N/A 10/11/2013    Procedure: LEFT HEART CATHETERIZATION WITH CORONARY ANGIOGRAM;  Surgeon: Sinclair Grooms, MD;  Location: [facility] CATH LAB;  Service: Cardiovascular;   Laterality: N/A;    History   Social History  . Marital Status: Married    Spouse Name: N/A    Number of Children: N/A  . Years of Education: N/A   Occupational History  . Not on file.   Social History Main Topics  . Smoking status: Former Smoker    Start date: 04/17/1967    Quit date: 05/12/2000  . Smokeless tobacco: Never Used  . Alcohol  Use: No     Comment: RECOVERING ALCOHOLIC 9 YRS- 2683  . Drug Use: No  . Sexual Activity: Not on file   Other Topics Concern  . Not on file   Social History Narrative     Filed Vitals:   06/19/14 1450  Height: 5\' 9"  (1.753 m)  Weight: 254 lb (115.214 kg)   BP 122/80  Pulse 63 SpO2 96%   PHYSICAL EXAM General: NAD HEENT: Normal. Neck: No JVD, no thyromegaly. Lungs: Clear to auscultation bilaterally with normal respiratory effort. CV: Nondisplaced PMI.  Regular rate and rhythm, normal S1/S2, no S3/S4, no murmur. No pretibial or periankle edema.  No carotid bruit.  Normal pedal pulses.  Abdomen: Soft, nontender, no hepatosplenomegaly, no distention.  Neurologic: Alert and oriented x 3.  Psych: Normal affect. Skin: Normal. Musculoskeletal: Normal range of motion, no gross deformities. Extremities: No clubbing or cyanosis.   ECG: Most recent ECG reviewed.      ASSESSMENT AND PLAN: CORONARY ARTERY DISEASE -  Symptoms are consistent with accelerating angina, CCS class III-IV symptoms. LAD is occluded proximal to the previously placed stent with right to left collaterals and preserved left ventricular systolic function, EF 41%. I have attempted optimal medical therapy with Imdur 60 mg daily, metoprolol, lisinopril 5 mg daily, ASA, Plavix, and Lipitor 40 mg daily. Cath note mentions that if there is an inability to control his symptoms with optimal medical therapy, recannulization of the LAD could theoretically be pursued. I feel he meets this criteria and will proceed with coronary angiography. He has developed a dry cough and  I will thus switch lisinopril to losartan 25 mg daily.  ESSENTIAL HYPERTENSION, BENIGN -  His blood pressure is well controlled. He has developed a dry cough and I will thus switch lisinopril to losartan 25 mg daily.  Chronic systolic heart failure, EF 50%  Euvolemic currently, on Lasix 80 mg q am and 40 mg q pm. On beta blocker and ACEI. I will switch to ARB due to development of a cough.  Hyperlipidemia On 02/16/14, TC 127, TG 180, HDL 27, LDL 64. Continue Lipitor 40 mg daily.   Dispo: f/u after cath.  Kate Sable, M.D., F.A.C.C.

## 2014-06-19 NOTE — Patient Instructions (Signed)
Your physician has requested that you have a cardiac catheterization. Cardiac catheterization is used to diagnose and/or treat various heart conditions. Doctors may recommend this procedure for a number of different reasons. The most common reason is to evaluate chest pain. Chest pain can be a symptom of coronary artery disease (CAD), and cardiac catheterization can show whether plaque is narrowing or blocking your heart's arteries. This procedure is also used to evaluate the valves, as well as measure the blood flow and oxygen levels in different parts of your heart. For further information please visit [email or web address]. Please follow instruction sheet, as given. Your physician has recommended you make the following change in your medication:   Stop Lisinopril Begin Losartan 25mg  daily - new sent to pharmacy today.   Continue all other medications.  Follow up will be given at time of discharge from procedure above.

## 2014-06-21 ENCOUNTER — Encounter (HOSPITAL_COMMUNITY)
Admission: RE | Disposition: A | Discharge status: Home / Self Care | Payer: Self-pay | Source: Ambulatory Visit | Attending: Cardiovascular Disease

## 2014-06-21 ENCOUNTER — Inpatient Hospital Stay (HOSPITAL_COMMUNITY)
Admission: RE | Admit: 2014-06-21 | Discharge: 2014-06-24 | DRG: 247 | Disposition: A | Discharge status: Home / Self Care | Payer: Medicare FFS | Source: Ambulatory Visit | Attending: Cardiovascular Disease | Admitting: Cardiovascular Disease

## 2014-06-21 ENCOUNTER — Encounter (HOSPITAL_COMMUNITY): Payer: Self-pay | Admitting: *Deleted

## 2014-06-21 DIAGNOSIS — Y831 Surgical operation with implant of artificial internal device as the cause of abnormal reaction of the patient, or of later complication, without mention of misadventure at the time of the procedure: Secondary | ICD-10-CM | POA: Diagnosis present

## 2014-06-21 DIAGNOSIS — Z7902 Long term (current) use of antithrombotics/antiplatelets: Secondary | ICD-10-CM | POA: Diagnosis not present

## 2014-06-21 DIAGNOSIS — F419 Anxiety disorder, unspecified: Secondary | ICD-10-CM | POA: Diagnosis present

## 2014-06-21 DIAGNOSIS — I255 Ischemic cardiomyopathy: Secondary | ICD-10-CM

## 2014-06-21 DIAGNOSIS — I5042 Chronic combined systolic (congestive) and diastolic (congestive) heart failure: Secondary | ICD-10-CM | POA: Diagnosis present

## 2014-06-21 DIAGNOSIS — G4733 Obstructive sleep apnea (adult) (pediatric): Secondary | ICD-10-CM | POA: Diagnosis present

## 2014-06-21 DIAGNOSIS — Z955 Presence of coronary angioplasty implant and graft: Secondary | ICD-10-CM | POA: Diagnosis not present

## 2014-06-21 DIAGNOSIS — Z87891 Personal history of nicotine dependence: Secondary | ICD-10-CM

## 2014-06-21 DIAGNOSIS — I2582 Chronic total occlusion of coronary artery: Secondary | ICD-10-CM | POA: Diagnosis present

## 2014-06-21 DIAGNOSIS — R079 Chest pain, unspecified: Secondary | ICD-10-CM | POA: Diagnosis present

## 2014-06-21 DIAGNOSIS — Z7982 Long term (current) use of aspirin: Secondary | ICD-10-CM | POA: Diagnosis not present

## 2014-06-21 DIAGNOSIS — M797 Fibromyalgia: Secondary | ICD-10-CM | POA: Diagnosis present

## 2014-06-21 DIAGNOSIS — I200 Unstable angina: Secondary | ICD-10-CM

## 2014-06-21 DIAGNOSIS — I10 Essential (primary) hypertension: Secondary | ICD-10-CM | POA: Diagnosis present

## 2014-06-21 DIAGNOSIS — I25110 Atherosclerotic heart disease of native coronary artery with unstable angina pectoris: Principal | ICD-10-CM | POA: Diagnosis present

## 2014-06-21 DIAGNOSIS — Z79899 Other long term (current) drug therapy: Secondary | ICD-10-CM

## 2014-06-21 DIAGNOSIS — E785 Hyperlipidemia, unspecified: Secondary | ICD-10-CM | POA: Diagnosis present

## 2014-06-21 DIAGNOSIS — I252 Old myocardial infarction: Secondary | ICD-10-CM

## 2014-06-21 DIAGNOSIS — K219 Gastro-esophageal reflux disease without esophagitis: Secondary | ICD-10-CM | POA: Diagnosis present

## 2014-06-21 DIAGNOSIS — T82858A Stenosis of vascular prosthetic devices, implants and grafts, initial encounter: Secondary | ICD-10-CM | POA: Diagnosis present

## 2014-06-21 DIAGNOSIS — I2510 Atherosclerotic heart disease of native coronary artery without angina pectoris: Secondary | ICD-10-CM

## 2014-06-21 DIAGNOSIS — I209 Angina pectoris, unspecified: Secondary | ICD-10-CM | POA: Insufficient documentation

## 2014-06-21 HISTORY — PX: LEFT HEART CATHETERIZATION WITH CORONARY ANGIOGRAM: SHX5451

## 2014-06-21 LAB — BASIC METABOLIC PANEL
Anion gap: 8 (ref 5–15)
BUN: 12 mg/dL (ref 6–23)
CALCIUM: 9 mg/dL (ref 8.4–10.5)
CO2: 29 mmol/L (ref 19–32)
Chloride: 103 mmol/L (ref 96–112)
Creatinine, Ser: 0.98 mg/dL (ref 0.50–1.35)
GFR calc Af Amer: >90 mL/min (ref >90)
GFR calc non Af Amer: 86 mL/min — ABNORMAL LOW (ref >90)
GLUCOSE: 110 mg/dL — AB (ref 70–99)
Potassium: 4.1 mmol/L (ref 3.5–5.1)
Sodium: 140 mmol/L (ref 135–145)

## 2014-06-21 LAB — CBC
HCT: 41.3 % (ref 39.0–52.0)
HEMOGLOBIN: 14 g/dL (ref 13.0–17.0)
MCH: 32.3 pg (ref 26.0–34.0)
MCHC: 33.9 g/dL (ref 30.0–36.0)
MCV: 95.2 fL (ref 78.0–100.0)
PLATELETS: 209 10*3/uL (ref 150–400)
RBC: 4.34 MIL/uL (ref 4.22–5.81)
RDW: 12.8 % (ref 11.5–15.5)
WBC: 6.2 10*3/uL (ref 4.0–10.5)

## 2014-06-21 LAB — PROTIME-INR
INR: 1.01 (ref 0.00–1.49)
PROTHROMBIN TIME: 13.4 s (ref 11.6–15.2)

## 2014-06-21 SURGERY — LEFT HEART CATHETERIZATION WITH CORONARY ANGIOGRAM

## 2014-06-21 MED ORDER — PANTOPRAZOLE SODIUM 40 MG PO TBEC
40.0000 mg | DELAYED_RELEASE_TABLET | Freq: Every day | ORAL | Status: DC
  Administered 2014-06-22 – 2014-06-23 (×2): 40 mg via ORAL
  Filled 2014-06-21: qty 1

## 2014-06-21 MED ORDER — ASPIRIN EC 81 MG PO TBEC
81.0000 mg | DELAYED_RELEASE_TABLET | Freq: Every day | ORAL | Status: DC
  Administered 2014-06-22: 81 mg via ORAL
  Filled 2014-06-21 (×2): qty 1

## 2014-06-21 MED ORDER — ACETAMINOPHEN 325 MG PO TABS: 650.0000 mg | ORAL_TABLET | ORAL | Status: DC | PRN

## 2014-06-21 MED ORDER — LIDOCAINE HCL (PF) 1 % IJ SOLN
INTRAMUSCULAR | Status: AC
  Filled 2014-06-21: qty 30

## 2014-06-21 MED ORDER — SODIUM CHLORIDE 0.9 % IV SOLN
INTRAVENOUS | Status: DC
  Administered 2014-06-21: 09:00:00 via INTRAVENOUS

## 2014-06-21 MED ORDER — HYDROMORPHONE HCL 2 MG PO TABS
4.0000 mg | ORAL_TABLET | Freq: Three times a day (TID) | ORAL | Status: DC | PRN
  Administered 2014-06-21 – 2014-06-22 (×4): 4 mg via ORAL
  Filled 2014-06-21 (×4): qty 2

## 2014-06-21 MED ORDER — SODIUM CHLORIDE 0.9 % IJ SOLN: 3.0000 mL | INTRAMUSCULAR | Status: DC | PRN

## 2014-06-21 MED ORDER — FENTANYL CITRATE 0.05 MG/ML IJ SOLN
INTRAMUSCULAR | Status: AC
  Filled 2014-06-21: qty 2

## 2014-06-21 MED ORDER — HEPARIN (PORCINE) IN NACL 2-0.9 UNIT/ML-% IJ SOLN
INTRAMUSCULAR | Status: AC
  Filled 2014-06-21: qty 1500

## 2014-06-21 MED ORDER — FUROSEMIDE 40 MG PO TABS
40.0000 mg | ORAL_TABLET | Freq: Three times a day (TID) | ORAL | Status: DC
  Administered 2014-06-22 (×2): 40 mg via ORAL
  Filled 2014-06-21 (×8): qty 1

## 2014-06-21 MED ORDER — MIDAZOLAM HCL 2 MG/2ML IJ SOLN
INTRAMUSCULAR | Status: AC
  Filled 2014-06-21: qty 2

## 2014-06-21 MED ORDER — HEPARIN (PORCINE) IN NACL 100-0.45 UNIT/ML-% IJ SOLN
1650.0000 [IU]/h | INTRAMUSCULAR | Status: DC
  Administered 2014-06-21: 1350 [IU]/h via INTRAVENOUS
  Administered 2014-06-22: 1600 [IU]/h via INTRAVENOUS
  Administered 2014-06-23: 1800 [IU]/h via INTRAVENOUS
  Filled 2014-06-21 (×5): qty 250

## 2014-06-21 MED ORDER — HEPARIN SODIUM (PORCINE) 1000 UNIT/ML IJ SOLN
INTRAMUSCULAR | Status: AC
  Filled 2014-06-21: qty 1

## 2014-06-21 MED ORDER — VERAPAMIL HCL 2.5 MG/ML IV SOLN
INTRAVENOUS | Status: AC
  Filled 2014-06-21: qty 2

## 2014-06-21 MED ORDER — METOPROLOL SUCCINATE ER 100 MG PO TB24
100.0000 mg | ORAL_TABLET | Freq: Every day | ORAL | Status: DC
  Administered 2014-06-22 – 2014-06-24 (×3): 100 mg via ORAL
  Filled 2014-06-21 (×3): qty 1

## 2014-06-21 MED ORDER — SODIUM CHLORIDE 0.9 % IJ SOLN: 3.0000 mL | Freq: Two times a day (BID) | INTRAMUSCULAR | Status: DC

## 2014-06-21 MED ORDER — SODIUM CHLORIDE 0.9 % IV SOLN: 250.0000 mL | INTRAVENOUS | Status: DC | PRN

## 2014-06-21 MED ORDER — SODIUM CHLORIDE 0.9 % IV SOLN
INTRAVENOUS | Status: DC
  Administered 2014-06-21: 22:00:00 via INTRAVENOUS

## 2014-06-21 MED ORDER — GABAPENTIN 600 MG PO TABS
1800.0000 mg | ORAL_TABLET | Freq: Two times a day (BID) | ORAL | Status: DC
  Administered 2014-06-21 – 2014-06-23 (×4): 1800 mg via ORAL
  Filled 2014-06-21 (×5): qty 3

## 2014-06-21 MED ORDER — MIDAZOLAM HCL 2 MG/2ML IJ SOLN
INTRAMUSCULAR | Status: AC
Start: 2014-06-21 — End: 2014-06-21
  Filled 2014-06-21: qty 2

## 2014-06-21 MED ORDER — ASPIRIN 81 MG PO CHEW
81.0000 mg | CHEWABLE_TABLET | ORAL | Status: AC
Start: 2014-06-22 — End: 2014-06-21
  Administered 2014-06-21: 81 mg via ORAL

## 2014-06-21 MED ORDER — NITROGLYCERIN 1 MG/10 ML FOR IR/CATH LAB
INTRA_ARTERIAL | Status: AC
  Filled 2014-06-21: qty 10

## 2014-06-21 MED ORDER — CLONAZEPAM 0.5 MG PO TABS
0.5000 mg | ORAL_TABLET | Freq: Three times a day (TID) | ORAL | Status: DC | PRN
Start: 2014-06-21 — End: 2014-06-23
  Administered 2014-06-21 – 2014-06-22 (×4): 0.5 mg via ORAL
  Filled 2014-06-21 (×4): qty 1

## 2014-06-21 MED ORDER — ONDANSETRON HCL 4 MG/2ML IJ SOLN: 4.0000 mg | Freq: Four times a day (QID) | INTRAMUSCULAR | Status: DC | PRN

## 2014-06-21 MED ORDER — ATORVASTATIN CALCIUM 40 MG PO TABS
40.0000 mg | ORAL_TABLET | Freq: Every day | ORAL | Status: DC
  Administered 2014-06-21 – 2014-06-22 (×2): 40 mg via ORAL
  Filled 2014-06-21 (×3): qty 1

## 2014-06-21 MED ORDER — TICAGRELOR 90 MG PO TABS
90.0000 mg | ORAL_TABLET | Freq: Two times a day (BID) | ORAL | Status: DC
  Administered 2014-06-22 (×2): 90 mg via ORAL
  Filled 2014-06-21 (×4): qty 1

## 2014-06-21 MED ORDER — ASPIRIN 81 MG PO CHEW
CHEWABLE_TABLET | ORAL | Status: AC
  Filled 2014-06-21: qty 1

## 2014-06-21 MED ORDER — METOPROLOL SUCCINATE ER 100 MG PO TB24
100.0000 mg | ORAL_TABLET | Freq: Every day | ORAL | Status: DC
  Filled 2014-06-21: qty 1

## 2014-06-21 NOTE — CV Procedure (Signed)
[name] is a 64 y.o. male   [id number]  [id number] LOCATION:  FACILITY: [facility]  PHYSICIAN: Troy Sine, MD, [facility] 03/12/1951   DATE OF PROCEDURE:  06/21/2014     CARDIAC CATHETERIZATION    HISTORY:   [name] is a 64 year old gentleman who had remotely undergone stenting of his LAD after the first diagonal branch.  In June 2015 he was hospitalized for late presenting anterior wall non-ST segment elevation MI with congestive heart failure.  Catheterization revealed total occlusion of the LAD proximal to the previously placed stent after the first diagonal branch and there was evidence for faint right to left collaterals.  The patient has developed increasing episodes of recurrent chest pain despite increased medical therapy by Dr. Elgie Collard.  He now presents for cardiac catheterization and consideration for possible intervention to his LAD system   PROCEDURE:  Heart catheterization via the right radial approach: Coronary angiography, left ventriculography.  The patient was brought to the second floor [facility] cath lab in the fasting state. The patient was premedicated with Versed 2 mg and fentanyl 50 mcg. A right radial approach was utilized after an Allen's test verified adequate circulation. The right radial artery was punctured via the Seldinger technique, and a 6 [place] Glidesheath Slender was inserted without difficulty.  A radial cocktail consisting of Verapamil, IV nitroglycerin, and lidocaine was administered. Weight adjusted heparin was administered. A safety J wire was advanced into the ascending aorta. Diagnostic catheterization was done with a 5 [place] TIG 4.0 catheter. A 5 French pigtail catheter was used for left ventriculography. A TR radial band was applied for hemostasis. The patient left the catheterization laboratory in stable condition.   HEMODYNAMICS:   Central Aorta: 100/50  Left Ventricle: 100/15  ANGIOGRAPHY:   The left main coronary  artery was angiographically normal and bifurcated into the LAD and left circumflex coronary artery.   The LAD was a moderate size vessel that gave rise to a proximal large first diagonal vessel.  There was 70% smooth ostial narrowing of this first diagonal vessel.  On the patient's prior catheterization in June 2015.  The LAD had been completely occluded immediately beyond this diagonal and proximal to a previously placed stent.  At that time there was TIMI 0 flow.  Today, there was some flow in the antegrade direction.  There appear to be significant in-stent restenosis but there was flow seen within the stent after a gap.  There also was a second channel immediately above this may represent another diagonal branch that was subtotally occluded.  There was filling of the LAD with reduced TIMI flow beyond the stented segment but this antegrade flow was present in visualized to the mid distal LAD segment.  The left circumflex coronary artery was angiographically normal and gave rise to two major obtuse marginal branch.   The RCA was a very large caliber dominant angiographically normal vessel that gave rise to a large PDA and PLA vessel. There were faint septal collaterals from the PDA still RCA to the LAD system.  Left ventriculography revealed normal global LV contractility without focal segmental wall motion abnormalities. There was no evidence for mitral regurgitation.  The estimated ejection fraction was 55%.  Total contrast used: 105 cc Omnipaque  IMPRESSION:  Preserved LV function without focal segmental wall motion abnormalities and an ejection fraction of 55%.  Single-vessel cardiac artery disease with previous documented total occlusion of the LAD after the diagonal vesseland proximal to the previously placed LAD  stent by catheterization in June 2015 , now with  mild antegrade filling of the LAD system with residual in-stent narrowing of at least 80-90% within the stented segment ,  representing partially recanalized chronic total occlusion, but with evidence for 70% ostial large first diagonal stenosis and possible subtotal stenosis in the second diagonal vessel arising in the previous site of occlusion.   RECOMMENDATION:  [name] has developed class III- IV angina and has experienced recurrent increasing chest discomfort despite increased medical therapy.  His angiograms were reviewed with Dr. Martinique and also reviewed by Dr. Irish Lack.  The patient will be admitted, started on heparin anticoagulation, medical therapy will be increased.  He will be hydrated following the procedure with plans to attempt opening his LAD occlusion in 2 days.  In light of his unstable angina symptoms, I will change his Plavix to Brilinta for more effective antiplatelet therapy.   Troy Sine, MD, [facility] 06/21/2014 3:24 PM

## 2014-06-21 NOTE — Interval H&P Note (Signed)
Cath Lab Visit (complete for each Cath Lab visit)  Clinical Evaluation Leading to the Procedure:   ACS: No.  Non-ACS:    Anginal Classification: CCS IV  Anti-ischemic medical therapy: Maximal Therapy (2 or more classes of medications)  Non-Invasive Test Results: No non-invasive testing performed  Prior CABG: No previous CABG      History and Physical Interval Note:  06/21/2014 10:12 AM  [name]  has presented today for surgery, with the diagnosis of cp  The various methods of treatment have been discussed with the patient and family. After consideration of risks, benefits and other options for treatment, the patient has consented to  Procedure(s): LEFT HEART CATHETERIZATION WITH CORONARY ANGIOGRAM (N/A) as a surgical intervention .  The patient's history has been reviewed, patient examined, no change in status, stable for surgery.  I have reviewed the patient's chart and labs.  Questions were answered to the patient's satisfaction.     KELLY,THOMAS A

## 2014-06-21 NOTE — Progress Notes (Signed)
RT setup CPAP on auto t of 20cmH2O max and 6 cmH2O minimum via medium full face mask for patient.  Patient stated that he would apply CPAP when he was getting ready to go to sleep.  RT advised patient to give RT a call if he needed any assistance. RT will continue to monitor.

## 2014-06-21 NOTE — H&P (View-Only) (Signed)
Patient ID: [name], male   DOB: [birth year], 64 y.o.   MRN: [id number]      SUBJECTIVE: The patient returns for follow up of coronary artery disease and chronic systolic and diastolic heart failure. He was hospitalized in 10/2013 for a late-presenting anterior wall non-STEMI and congestive heart failure. Coronary angiography demonstrated a totally occluded LAD proximal to the previously placed mid LAD stent and after the first diagonal branch. There were right to left collaterals noted. The right coronary artery and circumflex branch were widely patent. Left ventricular systolic function was preserved, EF 50% by left ventriculography with wall motion abnormalities. Medical management was recommended but LAD recanalization would be considered if chest pain could not be controlled.  On January 14 he was exercising and felt sharp chest pains and had to sit down on the couch. It radiated to the left side of his neck and down his left arm. He was diaphoretic and short of breath. He took 3 sublingual nitroglycerin to alleviate the pain. Since that time he becomes exhausted with minimal exertion including activities like washing the dishes and doing laundry. He has been having chest pains 3 or 4 times per week and he is markedly fatigued. He is here with his wife, [name], who is a [employer] scientist and is tearful when describing the decline of her husband's symptoms.   Review of Systems: As per "subjective", otherwise negative.  Allergies  Allergen Reactions  . Duloxetine Swelling    CHF  . Fentanyl Other (See Comments)    Confusion, panic attacks  . Lidocaine Other (See Comments)    REACTION: non-reactive  . Meperidine Hcl Other (See Comments)    REACTION: causes extreme mental reactions.  . Methadone Other (See Comments)    REACTION: causes heart failure  . Oxycodone Hcl Other (See Comments)    REACTION: causes heart failure  . Pregabalin Other (See Comments)    REACTION: chf  . Zolpidem Tartrate  Other (See Comments)    REACTION: confusion    Current Outpatient Prescriptions  Medication Sig Dispense Refill  . aspirin EC 81 MG tablet Take 81 mg by mouth every morning.    Marland Kitchen atorvastatin (LIPITOR) 40 MG tablet Take 1 tablet (40 mg total) by mouth daily. 90 tablet 3  . cholecalciferol (VITAMIN D) 1000 UNITS tablet Take 1,000 Units by mouth every morning.    . clonazePAM (KLONOPIN) 0.5 MG tablet Take 0.5 mg by mouth 3 (three) times daily as needed for anxiety.    . clopidogrel (PLAVIX) 75 MG tablet Take 1 tablet (75 mg total) by mouth daily with breakfast. 90 tablet 3  . clotrimazole-betamethasone (LOTRISONE) cream Apply 1 application topically as needed.    . CO ENZYME Q-10 PO Take 1 tablet by mouth every evening.    . diphenhydrAMINE (BENADRYL) 25 MG tablet Take 25-50 mg by mouth 2 (two) times daily as needed for itching.     . furosemide (LASIX) 40 MG tablet Take 40-80 mg by mouth 2 (two) times daily. Take 2 tablets in AM and 1 tablet in PM.    . gabapentin (NEURONTIN) 600 MG tablet Take 1,800 mg by mouth 2 (two) times daily.     Marland Kitchen guaifenesin (HUMIBID E) 400 MG TABS tablet Take 1,200 mg by mouth every 4 (four) hours.    Marland Kitchen HYDROmorphone (DILAUDID) 4 MG tablet Take 1 tablet by mouth 2 (two) times daily.    Marland Kitchen isometheptene-acetaminophen-dichloralphenazone (MIDRIN) 65-325-100 MG capsule Take 3-4 capsules by mouth as directed. Maximum  5 capsules in 12 hours for migraine headaches, 8 capsules in 24 hours for tension headaches.    . isosorbide mononitrate (IMDUR) 60 MG 24 hr tablet Take 1 tablet (60 mg total) by mouth daily. 90 tablet 3  . L-Lysine 500 MG TABS Take 500 mg by mouth 2 (two) times daily.     Marland Kitchen lisinopril (PRINIVIL,ZESTRIL) 5 MG tablet Take 1 tablet (5 mg total) by mouth daily. 90 tablet 3  . Loperamide HCl (IMODIUM A-D PO) Take 3 capsules by mouth daily as needed (chronic diarrhea).    . metoprolol succinate (TOPROL-XL) 100 MG 24 hr tablet Take 100 mg by mouth daily. Take with or  immediately following a meal.    . nitroGLYCERIN (NITROSTAT) 0.4 MG SL tablet Place 1 tablet (0.4 mg total) under the tongue every 5 (five) minutes as needed. For chest discomfort 25 tablet 3  . NON FORMULARY CPAP    . omeprazole (PRILOSEC) 40 MG capsule Take 40 mg by mouth daily before breakfast.     . OVER THE COUNTER MEDICATION Take 1 tablet by mouth 2 (two) times daily. Tumeric    . polyethylene glycol (MIRALAX / GLYCOLAX) packet Take 17 g by mouth daily as needed.    . polyvinyl alcohol-povidone (HYPOTEARS) 1.4-0.6 % ophthalmic solution Place 1-2 drops into both eyes daily as needed (dry eyes). For dry eyes    . potassium chloride (K-DUR) 10 MEQ tablet Take 10-20 mEq by mouth 2 (two) times daily. 2 tablets in AM and 1 tablet in PM    . vitamin C (ASCORBIC ACID) 500 MG tablet Take 500 mg by mouth every morning.      No current facility-administered medications for this visit.    Past Medical History  Diagnosis Date  . Coronary artery disease     STENT X 1 2010 Mid LAD  . Neuropathy   . Fibromyalgia   . Penile lesion   . GERD (gastroesophageal reflux disease)   . History of kidney stones   . Sleep apnea     USES C-PAP  . Anxiety     HX PANIC ATTACKS  . History of vertigo   . Sjogren's disease   . Narcolepsy   . Headache(784.0)   . Chronic cough   . Dry eyes   . Fluid retention   . Hypertension   . CHF (congestive heart failure)     Past Surgical History  Procedure Laterality Date  . Meatotomy  X2  . Hernia repair      ING HERNIA REPAIR  . Circumcision    . Vasectomy    . Breast surgery      BIL MASTECTOMY  FOR BENIGN LUMPS  . Carpal tunnell  BIL  . Wrist fracture surgery  L  X3 SURGERIES  . Coronary angioplasty with stent placement  X1 2010  . Left heart catheterization with coronary angiogram N/A 10/11/2013    Procedure: LEFT HEART CATHETERIZATION WITH CORONARY ANGIOGRAM;  Surgeon: Sinclair Grooms, MD;  Location: [facility] CATH LAB;  Service: Cardiovascular;   Laterality: N/A;    History   Social History  . Marital Status: Married    Spouse Name: N/A    Number of Children: N/A  . Years of Education: N/A   Occupational History  . Not on file.   Social History Main Topics  . Smoking status: Former Smoker    Start date: 04/17/1967    Quit date: 05/12/2000  . Smokeless tobacco: Never Used  . Alcohol  Use: No     Comment: RECOVERING ALCOHOLIC 9 YRS- 1751  . Drug Use: No  . Sexual Activity: Not on file   Other Topics Concern  . Not on file   Social History Narrative     Filed Vitals:   06/19/14 1450  Height: 5\' 9"  (1.753 m)  Weight: 254 lb (115.214 kg)   BP 122/80  Pulse 63 SpO2 96%   PHYSICAL EXAM General: NAD HEENT: Normal. Neck: No JVD, no thyromegaly. Lungs: Clear to auscultation bilaterally with normal respiratory effort. CV: Nondisplaced PMI.  Regular rate and rhythm, normal S1/S2, no S3/S4, no murmur. No pretibial or periankle edema.  No carotid bruit.  Normal pedal pulses.  Abdomen: Soft, nontender, no hepatosplenomegaly, no distention.  Neurologic: Alert and oriented x 3.  Psych: Normal affect. Skin: Normal. Musculoskeletal: Normal range of motion, no gross deformities. Extremities: No clubbing or cyanosis.   ECG: Most recent ECG reviewed.      ASSESSMENT AND PLAN: CORONARY ARTERY DISEASE -  Symptoms are consistent with accelerating angina, CCS class III-IV symptoms. LAD is occluded proximal to the previously placed stent with right to left collaterals and preserved left ventricular systolic function, EF 02%. I have attempted optimal medical therapy with Imdur 60 mg daily, metoprolol, lisinopril 5 mg daily, ASA, Plavix, and Lipitor 40 mg daily. Cath note mentions that if there is an inability to control his symptoms with optimal medical therapy, recannulization of the LAD could theoretically be pursued. I feel he meets this criteria and will proceed with coronary angiography. He has developed a dry cough and  I will thus switch lisinopril to losartan 25 mg daily.  ESSENTIAL HYPERTENSION, BENIGN -  His blood pressure is well controlled. He has developed a dry cough and I will thus switch lisinopril to losartan 25 mg daily.  Chronic systolic heart failure, EF 50%  Euvolemic currently, on Lasix 80 mg q am and 40 mg q pm. On beta blocker and ACEI. I will switch to ARB due to development of a cough.  Hyperlipidemia On 02/16/14, TC 127, TG 180, HDL 27, LDL 64. Continue Lipitor 40 mg daily.   Dispo: f/u after cath.  Kate Sable, M.D., F.A.C.C.

## 2014-06-21 NOTE — Progress Notes (Signed)
Ambulated to BR. Denies discomfort.

## 2014-06-21 NOTE — Progress Notes (Signed)
ANTICOAGULATION CONSULT NOTE - Initial Consult  Pharmacy Consult for Heparin Indication: unstable angina  Allergies  Allergen Reactions  . Duloxetine Swelling    CHF  . Fentanyl Other (See Comments)    Confusion, panic attacks  . Lidocaine Other (See Comments)    REACTION: non-reactive  . Meperidine Hcl Other (See Comments)    REACTION: causes extreme mental reactions.  . Methadone Other (See Comments)    REACTION: causes heart failure  . Oxycodone Hcl Other (See Comments)    REACTION: causes heart failure  . Pregabalin Other (See Comments)    REACTION: chf  . Zolpidem Tartrate Other (See Comments)    REACTION: confusion    Patient Measurements: Height: 5\' 9"  (175.3 cm) Weight: 251 lb (113.853 kg) IBW/kg (Calculated) : 70.7 Heparin Dosing Weight: 97 kg  Vital Signs: Temp: 98.4 F (36.9 C) (02/10 1700) Temp Source: Oral (02/10 1700) BP: 135/68 mmHg (02/10 1800) Pulse Rate: 55 (02/10 1800)  Labs:  Recent Labs  06/21/14 0830  HGB 14.0  HCT 41.3  PLT 209  LABPROT 13.4  INR 1.01  CREATININE 0.98    Estimated Creatinine Clearance: 96 mL/min (by C-G formula based on Cr of 0.98).   Medical History: Past Medical History  Diagnosis Date  . Coronary artery disease     STENT X 1 2010 Mid LAD  . Neuropathy   . Fibromyalgia   . Penile lesion   . GERD (gastroesophageal reflux disease)   . History of kidney stones   . Sleep apnea     USES C-PAP  . Anxiety     HX PANIC ATTACKS  . History of vertigo   . Sjogren's disease   . Narcolepsy   . Headache(784.0)   . Chronic cough   . Dry eyes   . Fluid retention   . Hypertension   . CHF (congestive heart failure)    Assessment:  s/p cardiac cath today. Heparin to begin ~ 8hrs after sheath out.  RN reports TR band deflation completed ~1:20pm today. Plan noted for procedure on 06/23/14 to attempt opening LAD occlusion.  Goal of Therapy:  Heparin level 0.3-0.7 units/ml Monitor platelets by anticoagulation  protocol: Yes   Plan:   Heparin drip to begin ~9:30pm at 1350 units/hr.  Daily heparin level and CBC while on heparin.  Am level should be ~ 8hrs after drip begins.  Arty Baumgartner, [place] Pager: [phone] 06/21/2014,6:57 PM

## 2014-06-22 DIAGNOSIS — I200 Unstable angina: Secondary | ICD-10-CM

## 2014-06-22 DIAGNOSIS — I25700 Atherosclerosis of coronary artery bypass graft(s), unspecified, with unstable angina pectoris: Secondary | ICD-10-CM

## 2014-06-22 DIAGNOSIS — I5032 Chronic diastolic (congestive) heart failure: Secondary | ICD-10-CM

## 2014-06-22 LAB — CBC
HCT: 40.7 % (ref 39.0–52.0)
Hemoglobin: 13.8 g/dL (ref 13.0–17.0)
MCH: 32.5 pg (ref 26.0–34.0)
MCHC: 33.9 g/dL (ref 30.0–36.0)
MCV: 96 fL (ref 78.0–100.0)
PLATELETS: 186 10*3/uL (ref 150–400)
RBC: 4.24 MIL/uL (ref 4.22–5.81)
RDW: 12.8 % (ref 11.5–15.5)
WBC: 5.7 10*3/uL (ref 4.0–10.5)

## 2014-06-22 LAB — BASIC METABOLIC PANEL
Anion gap: 6 (ref 5–15)
BUN: 11 mg/dL (ref 6–23)
CHLORIDE: 107 mmol/L (ref 96–112)
CO2: 27 mmol/L (ref 19–32)
Calcium: 8.8 mg/dL (ref 8.4–10.5)
Creatinine, Ser: 0.97 mg/dL (ref 0.50–1.35)
GFR calc Af Amer: >90 mL/min (ref >90)
GFR calc non Af Amer: 86 mL/min — ABNORMAL LOW (ref >90)
GLUCOSE: 113 mg/dL — AB (ref 70–99)
POTASSIUM: 3.8 mmol/L (ref 3.5–5.1)
SODIUM: 140 mmol/L (ref 135–145)

## 2014-06-22 LAB — HEPARIN LEVEL (UNFRACTIONATED)
HEPARIN UNFRACTIONATED: 0.17 [IU]/mL — AB (ref 0.30–0.70)
Heparin Unfractionated: 0.26 IU/mL — ABNORMAL LOW (ref 0.30–0.70)

## 2014-06-22 LAB — MRSA PCR SCREENING: MRSA by PCR: POSITIVE — AB

## 2014-06-22 MED ORDER — LOPERAMIDE HCL 1 MG/5ML PO LIQD
2.0000 mg | ORAL | Status: DC | PRN
  Filled 2014-06-22: qty 10

## 2014-06-22 MED ORDER — DIAZEPAM 5 MG PO TABS
10.0000 mg | ORAL_TABLET | Freq: Once | ORAL | Status: AC | PRN
  Filled 2014-06-22: qty 2

## 2014-06-22 MED ORDER — NITROGLYCERIN 0.4 MG SL SUBL
SUBLINGUAL_TABLET | SUBLINGUAL | Status: AC
  Administered 2014-06-22: 0.4 mg
  Filled 2014-06-22: qty 1

## 2014-06-22 MED ORDER — NITROGLYCERIN 0.4 MG SL SUBL: 0.4000 mg | SUBLINGUAL_TABLET | SUBLINGUAL | Status: DC | PRN

## 2014-06-22 MED ORDER — LOPERAMIDE HCL 1 MG/5ML PO LIQD
4.0000 mg | ORAL | Status: DC | PRN
Start: 2014-06-22 — End: 2014-06-22

## 2014-06-22 MED ORDER — MUPIROCIN 2 % EX OINT
1.0000 "application " | TOPICAL_OINTMENT | Freq: Two times a day (BID) | CUTANEOUS | Status: DC
  Administered 2014-06-22 – 2014-06-24 (×4): 1 via NASAL
  Filled 2014-06-22 (×2): qty 22

## 2014-06-22 MED ORDER — CHLORHEXIDINE GLUCONATE CLOTH 2 % EX PADS
6.0000 | MEDICATED_PAD | Freq: Every day | CUTANEOUS | Status: DC
  Administered 2014-06-23 – 2014-06-24 (×2): 6 via TOPICAL

## 2014-06-22 MED ORDER — LOPERAMIDE HCL 1 MG/5ML PO LIQD
4.0000 mg | Freq: Once | ORAL | Status: AC
  Administered 2014-06-22: 4 mg via ORAL
  Filled 2014-06-22 (×2): qty 20

## 2014-06-22 MED ORDER — POTASSIUM CHLORIDE CRYS ER 10 MEQ PO TBCR
10.0000 meq | EXTENDED_RELEASE_TABLET | Freq: Two times a day (BID) | ORAL | Status: DC
  Administered 2014-06-22 – 2014-06-24 (×5): 10 meq via ORAL
  Filled 2014-06-22 (×6): qty 1

## 2014-06-22 NOTE — Progress Notes (Signed)
Patient Name: [name] Date of Encounter: 06/22/2014     Active Problems:   Unstable angina    SUBJECTIVE  A little CP and SOB yesterday. Upset he has been stuck so many times.   CURRENT MEDS . aspirin EC  81 mg Oral Daily  . atorvastatin  40 mg Oral q1800  . furosemide  40 mg Oral TID  . gabapentin  1,800 mg Oral BID  . metoprolol succinate  100 mg Oral Daily  . pantoprazole  40 mg Oral Daily  . ticagrelor  90 mg Oral BID    OBJECTIVE  Filed Vitals:   06/21/14 2120 06/22/14 0253 06/22/14 0320 06/22/14 0424  BP: 132/64 141/64 129/60 142/64  Pulse: 62 52 57 54  Temp: 98.1 F (36.7 C)   98.3 F (36.8 C)  TempSrc: Oral   Oral  Resp: 17 16  15   Height:      Weight:      SpO2: 96% 100%  100%    Intake/Output Summary (Last 24 hours) at 06/22/14 1020 Last data filed at 06/22/14 0839  Gross per 24 hour  Intake    360 ml  Output   1550 ml  Net  -1190 ml   Filed Weights   06/21/14 0820  Weight: 251 lb (113.853 kg)    PHYSICAL EXAM  General: Pleasant, NAD. Neuro: Alert and oriented X 3. Moves all extremities spontaneously. Psych: Normal affect. HEENT:  Normal  Neck: Supple without bruits or JVD. Lungs:  Resp regular and unlabored, CTA. Heart: RRR no s3, s4, or murmurs. Abdomen: Soft, non-tender, non-distended, BS + x 4.  Extremities: No clubbing, cyanosis or edema. DP/PT/Radials 2+ and equal bilaterally.  Accessory Clinical Findings  CBC  Recent Labs  06/21/14 0830 06/22/14 0555  WBC 6.2 5.7  HGB 14.0 13.8  HCT 41.3 40.7  MCV 95.2 96.0  PLT 209 992   Basic Metabolic Panel  Recent Labs  06/21/14 0830  NA 140  K 4.1  CL 103  CO2 29  GLUCOSE 110*  BUN 12  CREATININE 0.98  CALCIUM 9.0    TELE  NSR with sinus brady. Some PVCs  Radiology/Studies  No results found.  ASSESSMENT AND PLAN  Mr. Mattern is a 64 year old gentleman with a hx of CAD s/p remote stenting of his LAD after D1; NSTEMI (6/2-15) w/ total occlusion of the  LAD proximal to the previously placed stent after the D1 branch and there was evidence for faint R-> L collaterals, neuropathy, OSA on CPAP, HTN, chronic diastolic CHF, fibromyalgia, and GERD who was seen in Dr. Court Joy office on 06/19/14 for increasing episodes of recurrent chest pain despite increased medical therapy. He was admitted to [facility] on 06/21/14 for cardiac catheterization and consideration for possible intervention to his LAD   CAD/Chest pain- s/p LHC on 06/21/14 which revealed   1. Preserved LV function without focal segmental wall motion abnormalities and an EF 55%.  2. Single-vessel CAD with previous documented total occlusion of the LAD after the diagonal vessel and proximal to the previously placed LAD stent by catheterization in June 2015 , now with mild antegrade filling of the LAD system with residual in-stent narrowing of at least 80-90% within the stented segment , representing partially recanalized chronic total occlusion, but with evidence for 70% ostial large first diagonal stenosis and possible subtotal stenosis in the second diagonal vessel arising in the previous site of occlusion. -- His angiograms were reviewed with Dr. Martinique and Dr. Irish Lack. The  patient will be admitted, started on heparin anticoagulation, medical therapy will be increased. He will be hydrated following the procedure with plans to attempt opening his LAD occlusion in 2 days. -- Plavix converted to Brilinta. Continue ASA, brilinta, statin and toprol xl 100mg   -- On Cath board tomorrow at 10:30am.   Chronic diastolic CHF- a little SOB -- Continue 40 mg BID  GERD - cont PPI   Patient very upset with repeatedly being "stuck". apparently he has poor vascular access so we will proceed with PICC line placement for comfort measures.  Judy Pimple PA-C  Pager [phone]  Patient seen and examined and history reviewed. Agree with above findings and plan. Reviewed films with Dr. Irish Lack  and Dr. Claiborne Billings. It is interesting that he had documented occlusion of LAD in June 2015. He did well without symptoms until January and has been having class 3-4 angina since then. The LAD has recanalized but there is no clear channel. Plan PCI attempt tomorrow with Dr. Irish Lack. Patient understands that this is a complex lesion and may be difficult to cross. He understands that if it cannot be opened then CABG would be recommended. Will place a PICC line today for access.   Ericberto Padget Martin[place], [place] 06/22/2014 12:29 PM

## 2014-06-22 NOTE — Progress Notes (Signed)
[place] for Heparin Indication: unstable angina  Allergies  Allergen Reactions  . Duloxetine Swelling    CHF  . Fentanyl Other (See Comments)    Confusion, panic attacks  . Lidocaine Other (See Comments)    REACTION: non-reactive  . Meperidine Hcl Other (See Comments)    REACTION: causes extreme mental reactions.  . Methadone Other (See Comments)    REACTION: causes heart failure  . Oxycodone Hcl Other (See Comments)    REACTION: causes heart failure  . Pregabalin Other (See Comments)    REACTION: chf  . Zolpidem Tartrate Other (See Comments)    REACTION: confusion    Patient Measurements: Height: 5\' 9"  (175.3 cm) Weight: 251 lb (113.853 kg) IBW/kg (Calculated) : 70.7 Heparin Dosing Weight: 97 kg  Vital Signs: Temp: 98.3 F (36.8 C) (02/11 0424) Temp Source: Oral (02/11 0424) BP: 142/64 mmHg (02/11 0424) Pulse Rate: 54 (02/11 0424)  Labs:  Recent Labs  06/21/14 0830 06/22/14 0555  HGB 14.0 13.8  HCT 41.3 40.7  PLT 209 186  LABPROT 13.4  --   INR 1.01  --   HEPARINUNFRC  --  0.17*  CREATININE 0.98  --     Estimated Creatinine Clearance: 96 mL/min (by C-G formula based on Cr of 0.98).  Assessment:  64 yo male with CAD, s/p cath, awaiting PCi, for heparin  Goal of Therapy:  Heparin level 0.3-0.7 units/ml Monitor platelets by anticoagulation protocol: Yes   Plan:   Increase Heparin 1600 units/hr Check heparin level in 6 hours.  Phillis Knack, PharmD, BCPS

## 2014-06-22 NOTE — Progress Notes (Signed)
Dr. Rosanna Randy called back and placed orders for PRN SL nitroglycerin.   Patient resting comfortably and denies CP/SOB.   Will continue to monitor closely.   [name]

## 2014-06-22 NOTE — Progress Notes (Signed)
Utilization review completed.  

## 2014-06-22 NOTE — Progress Notes (Signed)
[place] for Heparin Indication: unstable angina  Allergies  Allergen Reactions  . Duloxetine Swelling    CHF  . Fentanyl Other (See Comments)    Confusion, panic attacks  . Lidocaine Other (See Comments)    REACTION: non-reactive  . Meperidine Hcl Other (See Comments)    REACTION: causes extreme mental reactions.  . Methadone Other (See Comments)    REACTION: causes heart failure  . Oxycodone Hcl Other (See Comments)    REACTION: causes heart failure  . Pregabalin Other (See Comments)    REACTION: chf  . Zolpidem Tartrate Other (See Comments)    REACTION: confusion    Patient Measurements: Height: 5\' 9"  (175.3 cm) Weight: 251 lb (113.853 kg) IBW/kg (Calculated) : 70.7 Heparin Dosing Weight: 97 kg  Vital Signs: Temp: 98.2 F (36.8 C) (02/11 1545) Temp Source: Oral (02/11 1545) BP: 142/68 mmHg (02/11 1545) Pulse Rate: 57 (02/11 1545)  Labs:  Recent Labs  06/21/14 0830 06/22/14 0555 06/22/14 1445  HGB 14.0 13.8  --   HCT 41.3 40.7  --   PLT 209 186  --   LABPROT 13.4  --   --   INR 1.01  --   --   HEPARINUNFRC  --  0.17* 0.26*  CREATININE 0.98  --  0.97    Estimated Creatinine Clearance: 97 mL/min (by C-G formula based on Cr of 0.97).  Assessment:  64 yo male with CAD, s/p cath, awaiting PCI. Heparin level subtherapeutic 0.26, increase rate to 1800 units/hr  Goal of Therapy:  Heparin level 0.3-0.7 units/ml Monitor platelets by anticoagulation protocol: Yes   Plan:   Increase Heparin 1800 units/hr Check heparin level in 6 hours. Daily Heparin level and CBC  Isac Sarna, BS Pharm D, BCPS Clinical Pharmacist

## 2014-06-22 NOTE — Progress Notes (Signed)
Dr. Rosanna Randy notified again.

## 2014-06-22 NOTE — Progress Notes (Signed)
Patient called out around 0245 and stated he had 3/10 chest pressure and pain radiating down his left arm. He also stated it was difficult to catch his breath. Pt 02 100% and blood pressure 141/64. Dr. Rosanna Randy notified.   Awaiting return call  Will continue to monitor.   Earlie Lou

## 2014-06-22 NOTE — Progress Notes (Signed)
CARE MANAGEMENT NOTE 06/22/2014  Patient:  [name], [name]   Account Number:  [id number]  Date Initiated:  06/22/2014  Documentation initiated by:  Marvetta Gibbons  Subjective/Objective Assessment:   Pt admitted with CAD s/p cath     Action/Plan:   PTA pt lived at home   Anticipated DC Date:  06/23/2014   Anticipated DC Plan:  [place]  CM consult  Medication Assistance      Choice offered to / List presented to:             Status of service:  In process, will continue to follow Medicare Important Message given?   (If response is "NO", the following Medicare IM given date fields will be blank) Date Medicare IM given:   Medicare IM given by:   Date Additional Medicare IM given:   Additional Medicare IM given by:    Discharge Disposition:    Per UR Regulation:  Reviewed for med. necessity/level of care/duration of stay  If discussed at [place] of Stay Meetings, dates discussed:    Comments:  06/22/14- 1200- Marvetta Gibbons RN, BSN [phone] Pt started on Brilinta- benefits check completed S/W JO @ [employer] RX# [phone]   BRILINTA 90MG  BID COVER- YES CO-PAY- $ 47.00  60 FOR [place]  spoke with pt- 30 day free card for Brilinta given and coverage info above shared-

## 2014-06-23 ENCOUNTER — Inpatient Hospital Stay (HOSPITAL_COMMUNITY): Payer: Medicare FFS

## 2014-06-23 ENCOUNTER — Encounter (HOSPITAL_COMMUNITY): Payer: Self-pay | Admitting: Interventional Cardiology

## 2014-06-23 ENCOUNTER — Other Ambulatory Visit: Payer: Self-pay

## 2014-06-23 ENCOUNTER — Encounter (HOSPITAL_COMMUNITY)
Admission: RE | Disposition: A | Discharge status: Home / Self Care | Payer: Self-pay | Source: Ambulatory Visit | Attending: Cardiovascular Disease

## 2014-06-23 DIAGNOSIS — I25110 Atherosclerotic heart disease of native coronary artery with unstable angina pectoris: Principal | ICD-10-CM

## 2014-06-23 DIAGNOSIS — I209 Angina pectoris, unspecified: Secondary | ICD-10-CM | POA: Insufficient documentation

## 2014-06-23 DIAGNOSIS — I2582 Chronic total occlusion of coronary artery: Secondary | ICD-10-CM

## 2014-06-23 HISTORY — PX: PERCUTANEOUS CORONARY STENT INTERVENTION (PCI-S): SHX5485

## 2014-06-23 LAB — POCT ACTIVATED CLOTTING TIME
ACTIVATED CLOTTING TIME: 276 s
ACTIVATED CLOTTING TIME: 183 s
Activated Clotting Time: 257 seconds
Activated Clotting Time: 276 seconds
Activated Clotting Time: 257 seconds
Activated Clotting Time: 171 seconds

## 2014-06-23 LAB — BASIC METABOLIC PANEL
Anion gap: 8 (ref 5–15)
BUN: 8 mg/dL (ref 6–23)
CO2: 26 mmol/L (ref 19–32)
CREATININE: 0.83 mg/dL (ref 0.50–1.35)
Calcium: 8.4 mg/dL (ref 8.4–10.5)
Chloride: 106 mmol/L (ref 96–112)
Glucose, Bld: 108 mg/dL — ABNORMAL HIGH (ref 70–99)
POTASSIUM: 3.8 mmol/L (ref 3.5–5.1)
Sodium: 140 mmol/L (ref 135–145)

## 2014-06-23 LAB — CBC
HCT: 40.5 % (ref 39.0–52.0)
HEMOGLOBIN: 13.8 g/dL (ref 13.0–17.0)
MCH: 32.2 pg (ref 26.0–34.0)
MCHC: 34.1 g/dL (ref 30.0–36.0)
MCV: 94.6 fL (ref 78.0–100.0)
Platelets: 197 10*3/uL (ref 150–400)
RBC: 4.28 MIL/uL (ref 4.22–5.81)
RDW: 12.9 % (ref 11.5–15.5)
WBC: 6 10*3/uL (ref 4.0–10.5)

## 2014-06-23 LAB — HEPARIN LEVEL (UNFRACTIONATED)
Heparin Unfractionated: 0.5 IU/mL (ref 0.30–0.70)
Heparin Unfractionated: 0.83 IU/mL — ABNORMAL HIGH (ref 0.30–0.70)

## 2014-06-23 SURGERY — PERCUTANEOUS CORONARY STENT INTERVENTION (PCI-S)
Anesthesia: LOCAL

## 2014-06-23 MED ORDER — POLYVINYL ALCOHOL-POVIDONE 1.4-0.6 % OP SOLN: 1.0000 [drp] | Freq: Every day | OPHTHALMIC | Status: DC | PRN

## 2014-06-23 MED ORDER — ATORVASTATIN CALCIUM 40 MG PO TABS
40.0000 mg | ORAL_TABLET | Freq: Every day | ORAL | Status: DC
  Administered 2014-06-23: 40 mg via ORAL
  Filled 2014-06-23 (×2): qty 1

## 2014-06-23 MED ORDER — GUAIFENESIN-CODEINE 100-10 MG/5ML PO SOLN
ORAL | Status: AC
  Filled 2014-06-23: qty 5

## 2014-06-23 MED ORDER — ASPIRIN EC 81 MG PO TBEC
81.0000 mg | DELAYED_RELEASE_TABLET | Freq: Every morning | ORAL | Status: DC
  Administered 2014-06-24: 81 mg via ORAL
  Filled 2014-06-23: qty 1

## 2014-06-23 MED ORDER — DIPHENHYDRAMINE HCL 25 MG PO TABS
25.0000 mg | ORAL_TABLET | Freq: Two times a day (BID) | ORAL | Status: DC | PRN
  Filled 2014-06-23: qty 2

## 2014-06-23 MED ORDER — HEPARIN SOD (PORK) LOCK FLUSH 100 UNIT/ML IV SOLN
INTRAVENOUS | Status: AC
  Filled 2014-06-23: qty 10

## 2014-06-23 MED ORDER — SODIUM CHLORIDE 0.9 % IV SOLN
INTRAVENOUS | Status: DC
Start: 2014-06-23 — End: 2014-06-23
  Administered 2014-06-23: 06:00:00 via INTRAVENOUS

## 2014-06-23 MED ORDER — POLYETHYLENE GLYCOL 3350 17 G PO PACK
17.0000 g | PACK | Freq: Every day | ORAL | Status: DC | PRN
  Filled 2014-06-23: qty 1

## 2014-06-23 MED ORDER — SODIUM CHLORIDE 0.9 % IV SOLN: 250.0000 mL | INTRAVENOUS | Status: DC | PRN

## 2014-06-23 MED ORDER — FUROSEMIDE 40 MG PO TABS
40.0000 mg | ORAL_TABLET | Freq: Three times a day (TID) | ORAL | Status: DC
  Administered 2014-06-23: 40 mg via ORAL
  Filled 2014-06-23 (×5): qty 1

## 2014-06-23 MED ORDER — ASPIRIN 81 MG PO CHEW
81.0000 mg | CHEWABLE_TABLET | ORAL | Status: AC
  Administered 2014-06-23: 81 mg via ORAL
  Filled 2014-06-23: qty 1

## 2014-06-23 MED ORDER — HEPARIN (PORCINE) IN NACL 2-0.9 UNIT/ML-% IJ SOLN
INTRAMUSCULAR | Status: AC
  Filled 2014-06-23: qty 500

## 2014-06-23 MED ORDER — HYDROMORPHONE HCL 2 MG PO TABS
4.0000 mg | ORAL_TABLET | Freq: Three times a day (TID) | ORAL | Status: DC | PRN
  Administered 2014-06-23 – 2014-06-24 (×2): 4 mg via ORAL
  Filled 2014-06-23 (×2): qty 2

## 2014-06-23 MED ORDER — BUPIVACAINE HCL (PF) 0.25 % IJ SOLN
INTRAMUSCULAR | Status: AC
  Filled 2014-06-23: qty 30

## 2014-06-23 MED ORDER — MIDAZOLAM HCL 2 MG/2ML IJ SOLN
INTRAMUSCULAR | Status: AC
  Filled 2014-06-23: qty 2

## 2014-06-23 MED ORDER — HEPARIN SODIUM (PORCINE) 1000 UNIT/ML IJ SOLN
INTRAMUSCULAR | Status: AC
  Filled 2014-06-23: qty 1

## 2014-06-23 MED ORDER — SODIUM CHLORIDE 0.9 % IJ SOLN: 3.0000 mL | INTRAMUSCULAR | Status: DC | PRN

## 2014-06-23 MED ORDER — LIDOCAINE HCL (PF) 1 % IJ SOLN
INTRAMUSCULAR | Status: AC
  Filled 2014-06-23: qty 30

## 2014-06-23 MED ORDER — ISOMETHEPTENE-APAP-DICHLORAL 65-325-100 MG PO CAPS: 1.0000 | ORAL_CAPSULE | ORAL | Status: DC | PRN

## 2014-06-23 MED ORDER — LOSARTAN POTASSIUM 25 MG PO TABS
25.0000 mg | ORAL_TABLET | Freq: Every day | ORAL | Status: DC
  Administered 2014-06-23 – 2014-06-24 (×2): 25 mg via ORAL
  Filled 2014-06-23 (×2): qty 1

## 2014-06-23 MED ORDER — GUAIFENESIN 100 MG/5ML PO SOLN
5.0000 mL | ORAL | Status: DC | PRN
  Administered 2014-06-24: 02:00:00 100 mg via ORAL
  Filled 2014-06-23 (×2): qty 5

## 2014-06-23 MED ORDER — GABAPENTIN 600 MG PO TABS
1800.0000 mg | ORAL_TABLET | Freq: Two times a day (BID) | ORAL | Status: DC
  Administered 2014-06-23 – 2014-06-24 (×2): 1800 mg via ORAL
  Filled 2014-06-23 (×3): qty 3

## 2014-06-23 MED ORDER — TICAGRELOR 90 MG PO TABS
90.0000 mg | ORAL_TABLET | Freq: Two times a day (BID) | ORAL | Status: DC
  Administered 2014-06-24: 90 mg via ORAL
  Filled 2014-06-23 (×3): qty 1

## 2014-06-23 MED ORDER — METOPROLOL SUCCINATE ER 100 MG PO TB24: 100.0000 mg | ORAL_TABLET | Freq: Every day | ORAL | Status: DC

## 2014-06-23 MED ORDER — ISOMETHEPTENE-APAP-DICHLORAL 65-325-100 MG PO CAPS: 3.0000 | ORAL_CAPSULE | ORAL | Status: DC

## 2014-06-23 MED ORDER — SODIUM CHLORIDE 0.9 % IV SOLN: 1.0000 mL/kg/h | INTRAVENOUS | Status: AC

## 2014-06-23 MED ORDER — ISOSORBIDE MONONITRATE ER 60 MG PO TB24
60.0000 mg | ORAL_TABLET | Freq: Every day | ORAL | Status: DC
  Administered 2014-06-23 – 2014-06-24 (×2): 60 mg via ORAL
  Filled 2014-06-23 (×2): qty 1

## 2014-06-23 MED ORDER — CLONAZEPAM 0.5 MG PO TABS
0.5000 mg | ORAL_TABLET | Freq: Three times a day (TID) | ORAL | Status: DC | PRN
  Administered 2014-06-23: 0.5 mg via ORAL
  Filled 2014-06-23: qty 1

## 2014-06-23 MED ORDER — SODIUM CHLORIDE 0.9 % IJ SOLN: 3.0000 mL | Freq: Two times a day (BID) | INTRAMUSCULAR | Status: DC

## 2014-06-23 MED ORDER — POLYVINYL ALCOHOL 1.4 % OP SOLN
1.0000 [drp] | Freq: Every day | OPHTHALMIC | Status: DC | PRN
  Filled 2014-06-23: qty 15

## 2014-06-23 MED ORDER — DIAZEPAM 5 MG PO TABS
10.0000 mg | ORAL_TABLET | Freq: Once | ORAL | Status: AC
  Administered 2014-06-23: 10 mg via ORAL

## 2014-06-23 MED ORDER — VITAMIN D3 25 MCG (1000 UNIT) PO TABS
1000.0000 [IU] | ORAL_TABLET | Freq: Every morning | ORAL | Status: DC
  Administered 2014-06-24: 10:00:00 1000 [IU] via ORAL
  Filled 2014-06-23: qty 1

## 2014-06-23 MED ORDER — ONDANSETRON HCL 4 MG/2ML IJ SOLN: 4.0000 mg | Freq: Four times a day (QID) | INTRAMUSCULAR | Status: DC | PRN

## 2014-06-23 MED ORDER — NITROGLYCERIN 0.4 MG SL SUBL: 0.4000 mg | SUBLINGUAL_TABLET | SUBLINGUAL | Status: DC | PRN

## 2014-06-23 MED ORDER — POTASSIUM CHLORIDE ER 10 MEQ PO TBCR: 10.0000 meq | EXTENDED_RELEASE_TABLET | Freq: Two times a day (BID) | ORAL | Status: DC

## 2014-06-23 MED ORDER — ASPIRIN 81 MG PO CHEW: 81.0000 mg | CHEWABLE_TABLET | Freq: Every day | ORAL | Status: DC

## 2014-06-23 MED ORDER — PANTOPRAZOLE SODIUM 40 MG PO TBEC
40.0000 mg | DELAYED_RELEASE_TABLET | Freq: Every day | ORAL | Status: DC
  Administered 2014-06-24: 10:00:00 40 mg via ORAL
  Filled 2014-06-23: qty 1

## 2014-06-23 MED ORDER — ACETAMINOPHEN 325 MG PO TABS: 650.0000 mg | ORAL_TABLET | ORAL | Status: DC | PRN

## 2014-06-23 MED ORDER — TICAGRELOR 90 MG PO TABS
ORAL_TABLET | ORAL | Status: AC
  Administered 2014-06-24: 90 mg via ORAL
  Filled 2014-06-23: qty 2

## 2014-06-23 NOTE — Progress Notes (Signed)
[place] for Heparin Indication: unstable angina  Allergies  Allergen Reactions  . Duloxetine Swelling    CHF  . Fentanyl Other (See Comments)    Confusion, panic attacks  . Lidocaine Other (See Comments)    REACTION: non-reactive  . Meperidine Hcl Other (See Comments)    REACTION: causes extreme mental reactions.  . Methadone Other (See Comments)    REACTION: causes heart failure  . Oxycodone Hcl Other (See Comments)    REACTION: causes heart failure  . Pregabalin Other (See Comments)    REACTION: chf  . Zolpidem Tartrate Other (See Comments)    REACTION: confusion    Patient Measurements: Height: 5\' 9"  (175.3 cm) Weight: 251 lb (113.853 kg) IBW/kg (Calculated) : 70.7 Heparin Dosing Weight: 97 kg  Vital Signs: Temp: 97.8 F (36.6 C) (02/11 2042) Temp Source: Oral (02/11 2042) BP: 127/78 mmHg (02/11 2042) Pulse Rate: 62 (02/11 2042)  Labs:  Recent Labs  06/21/14 0830 06/22/14 0555 06/22/14 1445 06/23/14 0006  HGB 14.0 13.8  --   --   HCT 41.3 40.7  --   --   PLT 209 186  --   --   LABPROT 13.4  --   --   --   INR 1.01  --   --   --   HEPARINUNFRC  --  0.17* 0.26* 0.50  CREATININE 0.98  --  0.97  --     Estimated Creatinine Clearance: 97 mL/min (by C-G formula based on Cr of 0.97).  Assessment: 64 yo male with CAD, s/p cath, awaiting PCI. Heparin level therapeutic 0.5 on 1800 units/hr. RN reports no s/s of bleeding.   Goal of Therapy:  Heparin level 0.3-0.7 units/ml Monitor platelets by anticoagulation protocol: Yes   Plan:  Continue Heparin at 1800 units/hr Check heparin level in AM  Daily Heparin level and CBC  Albertina Parr, PharmD., BCPS Clinical Pharmacist Pager [phone]

## 2014-06-23 NOTE — Progress Notes (Signed)
Site area: right groin  Site Prior to Removal:  Level 0  Pressure Applied For 20 MINUTES    Minutes Beginning at 1815  Manual:   Yes.    Patient Status During Pull:  stable  Post Pull Groin Site:  Level 0  Post Pull Instructions Given:  Yes.    Post Pull Pulses Present:  Yes.    Dressing Applied:  Yes.    Comments:

## 2014-06-23 NOTE — Procedures (Signed)
R arm PowerPICC placed under US and fluoroscopy No ptx on spot chest radiograph. No complication No blood loss. See complete dictation in [facility] PACS.  

## 2014-06-23 NOTE — CV Procedure (Signed)
PROCEDURE:  PCI of chronic total occlusion of the LAD  INDICATIONS:  Accelerating/Unstable angina  The risks, benefits, and details of the procedure were explained to the patient.  The patient verbalized understanding and wanted to proceed.  Informed written consent was obtained.  PROCEDURE TECHNIQUE:  After Xylocaine anesthesia a 26F sheath was placed in the right femoral artery with a single anterior needle wall stick.   Left coronary angiography was done using a CLS4 guide catheter.  The intervention was performed. Please see below for details. Manual compression will be used for hemostasis.    CONTRAST:  Total of 155 cc.  COMPLICATIONS:  None.    HEMODYNAMICS:  Aortic pressure was 134/67.    ANGIOGRAPHIC DATA:   The left main coronary artery is patent.  The left anterior descending artery is occluded in the mid vessel.  There appears to be a track above the lumen which looks like a false pouch. There may be a micro-channel which goes behind the stent. The lumen of the LAD which then fills the stented segment and distal and apical LAD is below this false pouch. There is no obvious connection. The disease extends proximal to a large first diagonal. The second diagonal is originating at the distal edge of the severe in-stent restenosis. The more proximal portion of the lesion and the total occlusion are de novo, while the distal portion of the occlusion is in-stent restenosis.  The distal LAD initially was very narrow and the second diagonal is occluded. After intracoronary nitroglycerin, both vessels increased in size dramatically.   PCI NARRATIVE: After arterial access, IV heparin was given. ACT was used to check that the heparin was therapeutic. Ultimately, a CLS 4 guiding catheters used to engage the left main. There is tortuosity at the distal left main which guided equipment into the ramus and circumflex systems. A pro-water wire with a very acute bend was directed towards the LAD. An  over-the-wire balloon was then used to maintain position in the LAD over the pro-water. The pro-water was taken out and a fielder XT wire was advanced to the mid LAD. This wire initially found the pouch above the true lumen. When redirected towards the true lumen, a microchannel was found and the wire went down to distal LAD. The 1.5 x 12 over-the-wire balloon which was used for support was then used to predilate the complete occlusion. The wire was switched out for this short pro-water and a dock wire. A 2.0 x 15 balloon was then used to predilate the entire diseased area. A BMW wire was placed into the large first diagonal. A 3.0 x 38 Synergy drug-eluting stent was then deployed at high pressure.  The diagonal was rewired with a BMW wire. The stent was then postdilated with a 3.75 noncompliant balloon. The first diagonal was significantly pinched. We then put the 2.0 balloon into the diagonal. In the LAD, a 2.5 balloon was placed in the LAD.  The ostium of the diagonal was dilated to 8 atm. Simultaneous kissing balloon inflations were then performed several times. There was an excellent angiographic result.  There was a 10% stenosis in the LAD. There was a 40% lesion in the proximal diagonal. TIMI-3 flow was maintained throughout the procedure in both the LAD and the diagonal.     IMPRESSIONS:  1. Successful PCI of the mid left anterior descending artery with a 3.0 x 38 Synergy Drug eluting stent, postdilated to 3.8 mm in diameter branches.  PTCA of the  first diagonal with a 2.0 x 15 balloon.   RECOMMENDATION:  Continue dual antiplatelet therapy for at least a year and possibly longer.  He should stay on oral nitrates.  His distal LAD and second diagonal were very responsive to intracoronary nitroglycerin. There was significant spasm at the beginning of the case and at several times during the case which resolved with nitroglycerin.  He'll be watched overnight. Anticipate discharge tomorrow unless  there is a complication. Brilinta was restarted. It would be reasonable to treat him with Brilinta for at least a month.  Could consider switching back to Plavix if bleeding risk is too high.

## 2014-06-23 NOTE — Progress Notes (Signed)
Pt placed on cpap at this time, pt prefers not to have water in the water chamber for humidity, states that's what he does at home. Pt resting comfortably at this time.

## 2014-06-23 NOTE — H&P (View-Only) (Signed)
Patient Name: [name] Date of Encounter: 06/22/2014     Active Problems:   Unstable angina    SUBJECTIVE  A little CP and SOB yesterday. Upset he has been stuck so many times.   CURRENT MEDS . aspirin EC  81 mg Oral Daily  . atorvastatin  40 mg Oral q1800  . furosemide  40 mg Oral TID  . gabapentin  1,800 mg Oral BID  . metoprolol succinate  100 mg Oral Daily  . pantoprazole  40 mg Oral Daily  . ticagrelor  90 mg Oral BID    OBJECTIVE  Filed Vitals:   06/21/14 2120 06/22/14 0253 06/22/14 0320 06/22/14 0424  BP: 132/64 141/64 129/60 142/64  Pulse: 62 52 57 54  Temp: 98.1 F (36.7 C)   98.3 F (36.8 C)  TempSrc: Oral   Oral  Resp: 17 16  15   Height:      Weight:      SpO2: 96% 100%  100%    Intake/Output Summary (Last 24 hours) at 06/22/14 1020 Last data filed at 06/22/14 0839  Gross per 24 hour  Intake    360 ml  Output   1550 ml  Net  -1190 ml   Filed Weights   06/21/14 0820  Weight: 251 lb (113.853 kg)    PHYSICAL EXAM  General: Pleasant, NAD. Neuro: Alert and oriented X 3. Moves all extremities spontaneously. Psych: Normal affect. HEENT:  Normal  Neck: Supple without bruits or JVD. Lungs:  Resp regular and unlabored, CTA. Heart: RRR no s3, s4, or murmurs. Abdomen: Soft, non-tender, non-distended, BS + x 4.  Extremities: No clubbing, cyanosis or edema. DP/PT/Radials 2+ and equal bilaterally.  Accessory Clinical Findings  CBC  Recent Labs  06/21/14 0830 06/22/14 0555  WBC 6.2 5.7  HGB 14.0 13.8  HCT 41.3 40.7  MCV 95.2 96.0  PLT 209 950   Basic Metabolic Panel  Recent Labs  06/21/14 0830  NA 140  K 4.1  CL 103  CO2 29  GLUCOSE 110*  BUN 12  CREATININE 0.98  CALCIUM 9.0    TELE  NSR with sinus brady. Some PVCs  Radiology/Studies  No results found.  ASSESSMENT AND PLAN  Mr. Toto is a 64 year old gentleman with a hx of CAD s/p remote stenting of his LAD after D1; NSTEMI (6/2-15) w/ total occlusion of the  LAD proximal to the previously placed stent after the D1 branch and there was evidence for faint R-> L collaterals, neuropathy, OSA on CPAP, HTN, chronic diastolic CHF, fibromyalgia, and GERD who was seen in Dr. Court Joy office on 06/19/14 for increasing episodes of recurrent chest pain despite increased medical therapy. He was admitted to [facility] on 06/21/14 for cardiac catheterization and consideration for possible intervention to his LAD   CAD/Chest pain- s/p LHC on 06/21/14 which revealed   1. Preserved LV function without focal segmental wall motion abnormalities and an EF 55%.  2. Single-vessel CAD with previous documented total occlusion of the LAD after the diagonal vessel and proximal to the previously placed LAD stent by catheterization in June 2015 , now with mild antegrade filling of the LAD system with residual in-stent narrowing of at least 80-90% within the stented segment , representing partially recanalized chronic total occlusion, but with evidence for 70% ostial large first diagonal stenosis and possible subtotal stenosis in the second diagonal vessel arising in the previous site of occlusion. -- His angiograms were reviewed with Dr. Martinique and Dr. Irish Lack. The  patient will be admitted, started on heparin anticoagulation, medical therapy will be increased. He will be hydrated following the procedure with plans to attempt opening his LAD occlusion in 2 days. -- Plavix converted to Brilinta. Continue ASA, brilinta, statin and toprol xl 100mg   -- On Cath board tomorrow at 10:30am.   Chronic diastolic CHF- a little SOB -- Continue 40 mg BID  GERD - cont PPI   Patient very upset with repeatedly being "stuck". apparently he has poor vascular access so we will proceed with PICC line placement for comfort measures.  Judy Pimple PA-C  Pager [phone]  Patient seen and examined and history reviewed. Agree with above findings and plan. Reviewed films with Dr. Irish Lack  and Dr. Claiborne Billings. It is interesting that he had documented occlusion of LAD in June 2015. He did well without symptoms until January and has been having class 3-4 angina since then. The LAD has recanalized but there is no clear channel. Plan PCI attempt tomorrow with Dr. Irish Lack. Patient understands that this is a complex lesion and may be difficult to cross. He understands that if it cannot be opened then CABG would be recommended. Will place a PICC line today for access.   Peter Martinique, [place] 06/22/2014 12:29 PM

## 2014-06-23 NOTE — Interval H&P Note (Signed)
Cath Lab Visit (complete for each Cath Lab visit)  Clinical Evaluation Leading to the Procedure:   ACS: Yes.    Non-ACS:    Anginal Classification: CCS IV  Anti-ischemic medical therapy: Minimal Therapy (1 class of medications)  Non-Invasive Test Results: No non-invasive testing performed  Prior CABG: No previous CABG   TIMI SCORE  Patient Information:  TIMI Score is 3  UA/NSTEMI and intermediate-risk features (e.g., TIMI score 3?4) for short-term risk of death or nonfatal MI  Revascularization of the presumed culprit artery   A (9)  Indication: 10; Score: 9    History and Physical Interval Note:  06/23/2014 1:03 PM  [name]  has presented today for surgery, with the diagnosis of cad  The various methods of treatment have been discussed with the patient and family. After consideration of risks, benefits and other options for treatment, the patient has consented to  Procedure(s): PERCUTANEOUS CORONARY STENT INTERVENTION (PCI-S) (N/A) as a surgical intervention .  The patient's history has been reviewed, patient examined, no change in status, stable for surgery.  I have reviewed the patient's chart and labs.  Questions were answered to the patient's satisfaction.     Devin Vanalstyne S.

## 2014-06-23 NOTE — Progress Notes (Signed)
[place] for Heparin Indication: unstable angina  Allergies  Allergen Reactions  . Duloxetine Swelling    CHF  . Fentanyl Other (See Comments)    Confusion, panic attacks  . Lidocaine Other (See Comments)    REACTION: non-reactive  . Meperidine Hcl Other (See Comments)    REACTION: causes extreme mental reactions.  . Methadone Other (See Comments)    REACTION: causes heart failure  . Oxycodone Hcl Other (See Comments)    REACTION: causes heart failure  . Pregabalin Other (See Comments)    REACTION: chf  . Zolpidem Tartrate Other (See Comments)    REACTION: confusion    Patient Measurements: Height: 5\' 9"  (175.3 cm) Weight: 251 lb (113.853 kg) IBW/kg (Calculated) : 70.7 Heparin Dosing Weight: 97 kg  Vital Signs: Temp: 97.5 F (36.4 C) (02/12 0538) Temp Source: Oral (02/12 0538) BP: 135/69 mmHg (02/12 0538) Pulse Rate: 54 (02/12 0538)  Labs:  Recent Labs  06/21/14 0830  06/22/14 0555 06/22/14 1445 06/23/14 0006 06/23/14 0720  HGB 14.0  --  13.8  --   --  13.8  HCT 41.3  --  40.7  --   --  40.5  PLT 209  --  186  --   --  197  LABPROT 13.4  --   --   --   --   --   INR 1.01  --   --   --   --   --   HEPARINUNFRC  --   < > 0.17* 0.26* 0.50 0.83*  CREATININE 0.98  --   --  0.97  --  0.83  < > = values in this interval not displayed.  Estimated Creatinine Clearance: 113.4 mL/min (by C-G formula based on Cr of 0.83).  Assessment: 64 yo male with CAD, s/p cath, awaiting PCI.  Heparin level this AM = 0.83  Goal of Therapy:  Heparin level 0.3-0.7 units/ml Monitor platelets by anticoagulation protocol: Yes   Plan:  Decrease heparin to 1650 units / hr Follow up after PCI Daily Heparin level and CBC  Thank you. Anette Guarneri, PharmD [phone]

## 2014-06-24 LAB — BASIC METABOLIC PANEL
ANION GAP: 6 (ref 5–15)
BUN: 9 mg/dL (ref 6–23)
CALCIUM: 8.5 mg/dL (ref 8.4–10.5)
CO2: 28 mmol/L (ref 19–32)
CREATININE: 0.98 mg/dL (ref 0.50–1.35)
Chloride: 103 mmol/L (ref 96–112)
GFR calc Af Amer: >90 mL/min (ref >90)
GFR calc non Af Amer: 86 mL/min — ABNORMAL LOW (ref >90)
GLUCOSE: 136 mg/dL — AB (ref 70–99)
Potassium: 3.9 mmol/L (ref 3.5–5.1)
Sodium: 137 mmol/L (ref 135–145)

## 2014-06-24 LAB — CBC
HCT: 39.3 % (ref 39.0–52.0)
Hemoglobin: 13.3 g/dL (ref 13.0–17.0)
MCH: 32.1 pg (ref 26.0–34.0)
MCHC: 33.8 g/dL (ref 30.0–36.0)
MCV: 94.9 fL (ref 78.0–100.0)
Platelets: 210 10*3/uL (ref 150–400)
RBC: 4.14 MIL/uL — ABNORMAL LOW (ref 4.22–5.81)
RDW: 13.1 % (ref 11.5–15.5)
WBC: 7.8 10*3/uL (ref 4.0–10.5)

## 2014-06-24 MED ORDER — TICAGRELOR 90 MG PO TABS
90.0000 mg | ORAL_TABLET | Freq: Two times a day (BID) | ORAL | Status: DC
Start: 2014-06-24 — End: 2014-07-06

## 2014-06-24 MED ORDER — TICAGRELOR 90 MG PO TABS: 90.0000 mg | ORAL_TABLET | Freq: Two times a day (BID) | ORAL | Status: DC

## 2014-06-24 NOTE — Discharge Summary (Signed)
Physician Discharge Summary  Patient ID: [name] MRN: [id number] DOB/AGE: 11/07/1950 64 y.o.   Primary Cardiologist: Dr. Bronson Ing  Admit date: 06/21/2014 Discharge date: 06/24/2014  Admission Diagnoses: Accelerating/Unstable angina  Discharge Diagnoses:  Active Problems:   Unstable angina   Accelerating angina   CAD - s/p PCI + DES to mid LAD; PTCA of the first diagonal with a 2.0 x 15 balloon  Discharged Condition: stable  Hospital Course: [name] is a 64 year old gentleman who had remotely undergone stenting of his LAD after the first diagonal branch. In June 2015 he was hospitalized for late presenting anterior wall non-ST segment elevation MI with congestive heart failure. Catheterization revealed total occlusion of the LAD proximal to the previously placed stent after the first diagonal branch and there was evidence for faint right to left collaterals. The patient has developed increasing episodes of recurrent chest pain despite increased medical therapy by Dr. Elgie Collard. He was subsequently referred for cardiac catheterization and consideration for possible intervention to his LAD system.   Initial cath was performed by Dr. Claiborne Billings on 06/21/2014. This revealed single-vessel cardiac artery disease with previous documented total occlusion of the LAD after the diagonal vesseland proximal to the previously placed LAD stent by catheterization in June 2015 , now with mild antegrade filling of the LAD system with residual in-stent narrowing of at least 80-90% within the stented segment , representing partially recanalized chronic total occlusion, but with evidence for 70% ostial large first diagonal stenosis and possible subtotal stenosis in the second diagonal vessel arising in the previous site of occlusion. His angiograms were reviewed by several other interventionalist including Dr. Martinique and Dr. Irish Lack. The decision was made to admit and place on IV heparin, increase  medical therapy and start IV fluids with plans to intervene on the LAD. On 06/23/14 he underwent a second LHC by Dr. Irish Lack for intervention. He underwent successful PCI of the mid left anterior descending artery with a 3.0 x 38 Synergy Drug eluting stent, postdilated to 3.8 mm in diameter branches as well as PTCA of the first diagonal with a 2.0 x 15 balloon. He tolerated the procedure well and had no recurrent CP. He was started on DAPT with ASA + Brilinta, with plans to change back to Palvix after 1 month of therapy. He was also continued on his BB, ARB, long acting nitrate and statin. He had no post cath complications. The right femoral cath site remained stable. He had no difficulties ambulating. Vital signs remained stable. He was last seen and examined by Dr. Acie Fredrickson who determined he was stable for discharge home. Post hospital f/u will be arranged with Dr. Bronson Ing.   Consults: None  Significant Diagnostic Studies:   Diagnostic LHC 06/21/14 HEMODYNAMICS:   Central Aorta: 100/50  Left Ventricle: 100/15  ANGIOGRAPHY:   The left main coronary artery was angiographically normal and bifurcated into the LAD and left circumflex coronary artery.   The LAD was a moderate size vessel that gave rise to a proximal large first diagonal vessel. There was 70% smooth ostial narrowing of this first diagonal vessel. On the patient's prior catheterization in June 2015. The LAD had been completely occluded immediately beyond this diagonal and proximal to a previously placed stent. At that time there was TIMI 0 flow. Today, there was some flow in the antegrade direction. There appear to be significant in-stent restenosis but there was flow seen within the stent after a gap. There also was a second channel immediately above this  may represent another diagonal branch that was subtotally occluded. There was filling of the LAD with reduced TIMI flow beyond the stented segment but this antegrade flow was  present in visualized to the mid distal LAD segment.  The left circumflex coronary artery was angiographically normal and gave rise to two major obtuse marginal branch.   The RCA was a very large caliber dominant angiographically normal vessel that gave rise to a large PDA and PLA vessel. There were faint septal collaterals from the PDA still RCA to the LAD system.  Left ventriculography revealed normal global LV contractility without focal segmental wall motion abnormalities. There was no evidence for mitral regurgitation. The estimated ejection fraction was 55%.   LHC+ Intervention 06/23/14 HEMODYNAMICS: Aortic pressure was 134/67.   ANGIOGRAPHIC DATA: The left main coronary artery is patent.  The left anterior descending artery is occluded in the mid vessel. There appears to be a track above the lumen which looks like a false pouch. There may be a micro-channel which goes behind the stent. The lumen of the LAD which then fills the stented segment and distal and apical LAD is below this false pouch. There is no obvious connection. The disease extends proximal to a large first diagonal. The second diagonal is originating at the distal edge of the severe in-stent restenosis. The more proximal portion of the lesion and the total occlusion are de novo, while the distal portion of the occlusion is in-stent restenosis. The distal LAD initially was very narrow and the second diagonal is occluded. After intracoronary nitroglycerin, both vessels increased in size dramatically.  Treatments: See Hospital Course  Discharge Exam: Blood pressure 110/63, pulse 73, temperature 98.7 F (37.1 C), temperature source Oral, resp. rate 18, height 5\' 9"  (1.753 m), weight 251 lb 5.2 oz (114 kg), SpO2 97 %. General appearance: alert, cooperative, no distress and moderately obese Neck: no carotid bruit and no JVD Resp: clear to auscultation bilaterally Cardio: regular rate and rhythm, S1, S2 normal, no murmur,  click, rub or gallop Extremities: no LEE Pulses: 2+ and symmetric Skin: warm and dry Neurologic: Grossly normal Incision/Wound: right groin    Disposition: 01-Home or Self Care      Discharge Instructions    Amb Referral to Cardiac Rehabilitation    Complete by:  As directed      Diet - low sodium heart healthy    Complete by:  As directed      Increase activity slowly    Complete by:  As directed             Medication List    STOP taking these medications        clopidogrel 75 MG tablet  Commonly known as:  PLAVIX     lisinopril 5 MG tablet  Commonly known as:  PRINIVIL,ZESTRIL      TAKE these medications        aspirin EC 81 MG tablet  Take 81 mg by mouth every morning.     atorvastatin 40 MG tablet  Commonly known as:  LIPITOR  Take 1 tablet (40 mg total) by mouth daily.     cholecalciferol 1000 UNITS tablet  Commonly known as:  VITAMIN D  Take 1,000 Units by mouth every morning.     clonazePAM 0.5 MG tablet  Commonly known as:  KLONOPIN  Take 0.5 mg by mouth 3 (three) times daily as needed for anxiety.     CO ENZYME Q-10 PO  Take 1 tablet by mouth  every evening.     diphenhydrAMINE 25 MG tablet  Commonly known as:  BENADRYL  Take 25-50 mg by mouth 2 (two) times daily as needed for itching.     furosemide 40 MG tablet  Commonly known as:  LASIX  Take 40 mg by mouth 3 (three) times daily. Take 2 tablets in AM and 1 tablet in PM.     gabapentin 600 MG tablet  Commonly known as:  NEURONTIN  Take 1,800 mg by mouth 2 (two) times daily.     HYDROmorphone 4 MG tablet  Commonly known as:  DILAUDID  Take 1 tablet by mouth 3 (three) times daily as needed.     IMODIUM A-D PO  Take 3 capsules by mouth daily as needed (chronic diarrhea).     isometheptene-acetaminophen-dichloralphenazone 65-325-100 MG capsule  Commonly known as:  MIDRIN  Take 3-4 capsules by mouth as directed. Maximum 5 capsules in 12 hours for migraine headaches, 8 capsules in 24  hours for tension headaches.     isosorbide mononitrate 60 MG 24 hr tablet  Commonly known as:  IMDUR  Take 1 tablet (60 mg total) by mouth daily.     L-Lysine 500 MG Tabs  Take 500 mg by mouth 2 (two) times daily.     losartan 25 MG tablet  Commonly known as:  COZAAR  Take 1 tablet (25 mg total) by mouth daily.     metoprolol succinate 100 MG 24 hr tablet  Commonly known as:  TOPROL-XL  Take 100 mg by mouth daily. Take with or immediately following a meal.     nitroGLYCERIN 0.4 MG SL tablet  Commonly known as:  NITROSTAT  Place 1 tablet (0.4 mg total) under the tongue every 5 (five) minutes as needed. For chest discomfort     NON FORMULARY  CPAP     omeprazole 40 MG capsule  Commonly known as:  PRILOSEC  Take 40 mg by mouth daily before breakfast.     OVER THE COUNTER MEDICATION  Take 1 tablet by mouth 2 (two) times daily. Tumeric     polyethylene glycol packet  Commonly known as:  MIRALAX / GLYCOLAX  Take 17 g by mouth daily as needed.     polyvinyl alcohol-povidone 1.4-0.6 % ophthalmic solution  Commonly known as:  HYPOTEARS  Place 1-2 drops into both eyes daily as needed (dry eyes). For dry eyes     potassium chloride 10 MEQ tablet  Commonly known as:  K-DUR  Take 10-20 mEq by mouth 2 (two) times daily. 2 tablets in AM and 1 tablet in PM     ticagrelor 90 MG Tabs tablet  Commonly known as:  BRILINTA  Take 1 tablet (90 mg total) by mouth 2 (two) times daily.     ticagrelor 90 MG Tabs tablet  Commonly known as:  BRILINTA  Take 1 tablet (90 mg total) by mouth 2 (two) times daily.     vitamin C 500 MG tablet  Commonly known as:  ASCORBIC ACID  Take 500 mg by mouth every morning.       Follow-up Information    Follow up with Herminio Commons, MD.   Specialty:  Cardiology   Why:  our office will call you with a follow-up   Contact information:   [place] [address] [phone]       TIME SPENT ON DISCHARGE, INCLUDING PHYSICIAN  TIME: >30 MINUTES  Signed: Lyda Jester 06/24/2014, 8:31 AM  Attending Note:   The patient was seen and examined.  Agree with assessment and plan as noted above.  Changes made to the above note as needed.  Pt is doing quite well from a cardiac standpoint.  No CP ,  Right radial cath site ( from previous cath on Wednesday) and his RFA cath site look ok Slight echymosis of RFA site.   Will take Brilinta and the ASA for a year.  Follow of with Dr. Bronson Ing.   Ready for DC.     Thayer Headings, Brooke Bonito., MD, [facility] 06/24/2014, 8:49 AM [address],  [place] Pager [phone]

## 2014-06-24 NOTE — Progress Notes (Signed)
CARDIAC REHAB PHASE I   PRE:  Rate/Rhythm: 75 SR  BP:  Supine:   Sitting: 116/61  Standing:    SaO2: 100% 1L O2  MODE:  Ambulation: 600 ft   POST:  Rate/Rhythm: 78 SR  BP:  Supine:   Sitting: 110/63  Standing:    SaO2: 100% RA  5320-2334 Pt tolerated ambulation well without c/o, VSS. To bed after walk, where PCI education was reviewed with patient including restrictions, risk factor modification, CP, NTG use and calling 911, heart health, low sodium diet and activity progression. Discussed Phase 2 cardiac rehab and permission given to send contact info to CR at [place] voices understanding of instructions given.  Sol Passer, MS, ACSM CCEP

## 2014-06-27 ENCOUNTER — Telehealth: Payer: Self-pay | Admitting: *Deleted

## 2014-06-27 MED ORDER — NITROGLYCERIN 0.4 MG SL SUBL: 0.4000 mg | SUBLINGUAL_TABLET | SUBLINGUAL | Status: DC | PRN

## 2014-06-27 NOTE — Telephone Encounter (Signed)
Refill request from [facility] sub nitro. Medication sent to pharmacy.

## 2014-06-29 ENCOUNTER — Telehealth: Payer: Self-pay | Admitting: *Deleted

## 2014-06-29 NOTE — Telephone Encounter (Signed)
Message left on voice mail - in [place] last week, had 2 stents & balloon, started having chest pain but Nitro took care of it.  Short winded today, give out of breath with minimal activity.   Returned call to patient .  Stated that he feels fine now.  Had procedure done on Friday, 06/23/14.  Monday - felt good & may have over done it, up doing things.  Tuesday - felt okay, but tired.  Wednesday - had chest pain at two different times through out the day.  Did take Nitroglycerin x 1 with each episode.  Did get relief both times.  Today - has just felt tired & out of breath.  No dizziness.  Stated his BP today was 118/84  65  & O2 sat 97-99%.  Patient has follow up scheduled for 07/17/14 & is questioning if he could be seen sooner.    Reviewed with patient on proper use of Nitroglycerin & advised patient to go to ED for evaluation if this occurred in the mean time, in light of his recent procedure. Stated again that he felt fine now & was going out to the bank.  Also, stated that he was not driving, family member with him.

## 2014-06-29 NOTE — Telephone Encounter (Signed)
You can put him on my schedule next week or week after. Agree with your recommendations.

## 2014-06-30 NOTE — Telephone Encounter (Signed)
Patient notified.  Office visit changed to Tuesday, 07/11/2014 with Dr. Bronson Ing in [place] office.

## 2014-07-04 ENCOUNTER — Encounter (HOSPITAL_COMMUNITY)
Admission: RE | Admit: 2014-07-04 | Discharge: 2014-07-04 | Disposition: A | Discharge status: Home / Self Care | Payer: Self-pay | Source: Ambulatory Visit | Attending: Cardiovascular Disease | Admitting: Cardiovascular Disease

## 2014-07-04 NOTE — Progress Notes (Signed)
Patient was referred to CR by Dr. Bronson Ing. Patient has decided to come to maintenance self-pay program. He will start on Monday 07/10/14 at 3:45pm. During orientation advised patient on arrival and appointment times what to wear, what to do before, during and after exercise. Reviewed attendance and class policy. Talked about inclement weather and class consultation policy. Pt is scheduled to start Cardiac Rehab on 07/10/14 at 3:45pm. Pt was advised to come to class 5 minutes before class starts. He was also given instructions on meeting with the dietician and attending the Family Structure classes. Pt is eager to get started. Patient was able to complete 6 minute walk test.

## 2014-07-06 ENCOUNTER — Ambulatory Visit (INDEPENDENT_AMBULATORY_CARE_PROVIDER_SITE_OTHER): Payer: Medicare FFS | Admitting: Cardiovascular Disease

## 2014-07-06 ENCOUNTER — Encounter: Payer: Self-pay | Admitting: Cardiovascular Disease

## 2014-07-06 VITALS — BP 112/64 | HR 71 | Ht 69.0 in | Wt 263.0 lb

## 2014-07-06 DIAGNOSIS — I10 Essential (primary) hypertension: Secondary | ICD-10-CM

## 2014-07-06 DIAGNOSIS — Z955 Presence of coronary angioplasty implant and graft: Secondary | ICD-10-CM

## 2014-07-06 DIAGNOSIS — E785 Hyperlipidemia, unspecified: Secondary | ICD-10-CM

## 2014-07-06 DIAGNOSIS — Z87898 Personal history of other specified conditions: Secondary | ICD-10-CM

## 2014-07-06 DIAGNOSIS — I5022 Chronic systolic (congestive) heart failure: Secondary | ICD-10-CM

## 2014-07-06 DIAGNOSIS — I25118 Atherosclerotic heart disease of native coronary artery with other forms of angina pectoris: Secondary | ICD-10-CM

## 2014-07-06 DIAGNOSIS — Z9289 Personal history of other medical treatment: Secondary | ICD-10-CM

## 2014-07-06 NOTE — Progress Notes (Signed)
Patient ID: [name], male   DOB: [birth year], 64 y.o.   MRN: [id number]      SUBJECTIVE: The patient returns after undergoing drug-eluting stent placement to the mid LAD and angioplasty of the first diagonal branch. During his coronary intervention, there was significant coronary vessel spasm at the beginning of the case and at several times during the case which resolved with nitroglycerin. After being discharged, he called our office complaining of chest pain which resolved with one sublingual nitroglycerin tablet. He also had some dyspnea associated with this. Ultimately, he had realized that all these problems began with the initiation of Brilinta. He has a past history of panic attacks and began experiencing panic attacks while in the hospital. He takes Klonopin for this which quickly helps to alleviate these. He stopped taking Brilinta on Sunday and resume taking Plavix and he is felt that her since then and has not had any significant chest pain or dyspnea. He has chronic pain and takes Motrin when he becomes unbearable. He is managed by a neurologist for this. He is here with his wife, [name], who is a Marine scientist and also has several questions related to his medications.    Review of Systems: As per "subjective", otherwise negative.  Allergies  Allergen Reactions  . Duloxetine Swelling    CHF  . Fentanyl Other (See Comments)    Confusion, panic attacks  . Lidocaine Other (See Comments)    REACTION: non-reactive  . Meperidine Hcl Other (See Comments)    REACTION: causes extreme mental reactions.  . Methadone Other (See Comments)    REACTION: causes heart failure  . Oxycodone Hcl Other (See Comments)    REACTION: causes heart failure  . Pregabalin Other (See Comments)    REACTION: chf  . Zolpidem Tartrate Other (See Comments)    REACTION: confusion    Current Outpatient Prescriptions  Medication Sig Dispense Refill  . aspirin EC 81 MG tablet Take 81 mg by mouth every  morning.    Marland Kitchen atorvastatin (LIPITOR) 40 MG tablet Take 1 tablet (40 mg total) by mouth daily. 90 tablet 3  . cholecalciferol (VITAMIN D) 1000 UNITS tablet Take 1,000 Units by mouth every morning.    . clonazePAM (KLONOPIN) 0.5 MG tablet Take 0.5 mg by mouth 3 (three) times daily as needed for anxiety.    . CO ENZYME Q-10 PO Take 1 tablet by mouth every evening.    . diphenhydrAMINE (BENADRYL) 25 MG tablet Take 25-50 mg by mouth 2 (two) times daily as needed for itching.     . furosemide (LASIX) 40 MG tablet Take 40 mg by mouth 3 (three) times daily. Take 2 tablets in AM and 1 tablet in PM.    . gabapentin (NEURONTIN) 600 MG tablet Take 1,800 mg by mouth 2 (two) times daily.     Marland Kitchen HYDROmorphone (DILAUDID) 4 MG tablet Take 1 tablet by mouth 3 (three) times daily as needed.     . isometheptene-acetaminophen-dichloralphenazone (MIDRIN) 65-325-100 MG capsule Take 3-4 capsules by mouth as directed. Maximum 5 capsules in 12 hours for migraine headaches, 8 capsules in 24 hours for tension headaches.    . isosorbide mononitrate (IMDUR) 60 MG 24 hr tablet Take 1 tablet (60 mg total) by mouth daily. 90 tablet 3  . L-Lysine 500 MG TABS Take 500 mg by mouth 2 (two) times daily.     . Loperamide HCl (IMODIUM A-D PO) Take 3 capsules by mouth daily as needed (chronic diarrhea).    Marland Kitchen  losartan (COZAAR) 25 MG tablet Take 1 tablet (25 mg total) by mouth daily. 30 tablet 6  . metoprolol succinate (TOPROL-XL) 100 MG 24 hr tablet Take 100 mg by mouth daily. Take with or immediately following a meal.    . nitroGLYCERIN (NITROSTAT) 0.4 MG SL tablet Place 1 tablet (0.4 mg total) under the tongue every 5 (five) minutes as needed. For chest discomfort 25 tablet 3  . NON FORMULARY CPAP    . omeprazole (PRILOSEC) 40 MG capsule Take 40 mg by mouth daily before breakfast.     . OVER THE COUNTER MEDICATION Take 1 tablet by mouth 2 (two) times daily. Tumeric    . polyethylene glycol (MIRALAX / GLYCOLAX) packet Take 17 g by mouth  daily as needed.    . polyvinyl alcohol-povidone (HYPOTEARS) 1.4-0.6 % ophthalmic solution Place 1-2 drops into both eyes daily as needed (dry eyes). For dry eyes    . potassium chloride (K-DUR) 10 MEQ tablet Take 10-20 mEq by mouth 2 (two) times daily. 2 tablets in AM and 1 tablet in PM    . ticagrelor (BRILINTA) 90 MG TABS tablet Take 1 tablet (90 mg total) by mouth 2 (two) times daily. 60 tablet 10  . ticagrelor (BRILINTA) 90 MG TABS tablet Take 1 tablet (90 mg total) by mouth 2 (two) times daily. 60 tablet 0  . vitamin C (ASCORBIC ACID) 500 MG tablet Take 500 mg by mouth every morning.      No current facility-administered medications for this visit.    Past Medical History  Diagnosis Date  . Coronary artery disease     STENT X 1 2010 Mid LAD  . Neuropathy   . Fibromyalgia   . Penile lesion   . GERD (gastroesophageal reflux disease)   . History of kidney stones   . Sleep apnea     USES C-PAP  . Anxiety     HX PANIC ATTACKS  . History of vertigo   . Sjogren's disease   . Narcolepsy   . Headache(784.0)   . Chronic cough   . Dry eyes   . Fluid retention   . Hypertension   . CHF (congestive heart failure)     Past Surgical History  Procedure Laterality Date  . Meatotomy  X2  . Hernia repair      ING HERNIA REPAIR  . Circumcision    . Vasectomy    . Breast surgery      BIL MASTECTOMY  FOR BENIGN LUMPS  . Carpal tunnell  BIL  . Wrist fracture surgery  L  X3 SURGERIES  . Coronary angioplasty with stent placement  X1 2010  . Left heart catheterization with coronary angiogram N/A 10/11/2013    Procedure: LEFT HEART CATHETERIZATION WITH CORONARY ANGIOGRAM;  Surgeon: Sinclair Grooms, MD;  Location: [facility] CATH LAB;  Service: Cardiovascular;  Laterality: N/A;  . Left heart catheterization with coronary angiogram N/A 06/21/2014    Procedure: LEFT HEART CATHETERIZATION WITH CORONARY ANGIOGRAM;  Surgeon: Troy Sine, MD;  Location[facility] C[facility] LAB;  Service: Cardiovascular;   Laterality: N/A;  . Percutaneous coronary stent intervention (pci-s) N/A 06/23/2014    Procedure: PERCUTANEOUS CORONARY STENT INTERVENTION (PCI-S);  Surgeon: Jettie Booze, MD;  Location: [facility] CATH LAB;  Service: Cardiovascular;  Laterality: N/A;    History   Social History  . Marital Status: Married    Spouse Name: N/A  . Number of Children: N/A  . Years of Education: N/A   Occupational History  .  Not on file.   Social History Main Topics  . Smoking status: Former Smoker    Start date: 04/17/1967    Quit date: 05/12/2000  . Smokeless tobacco: Never Used  . 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 Drug Use: No  . Sexual Activity: Not on file   Other Topics Concern  . Not on file   Social History Narrative    BP 112/64  Pulse 71  SpO2 95%  Weight 263 lb (119.296 kg) Height 5\' 9"  (1.753 m)    PHYSICAL EXAM General: NAD HEENT: Normal. Neck: No JVD, no thyromegaly. Lungs: Clear to auscultation bilaterally with normal respiratory effort. CV: Nondisplaced PMI.  Regular rate and rhythm, normal S1/S2, no S3/S4, no murmur. No pretibial or periankle edema.  No carotid bruit.  Normal pedal pulses.  Abdomen: Soft, nontender, obese, no distention.  Neurologic: Alert and oriented x 3.  Psych: Normal affect. Skin: Normal. Musculoskeletal: Normal range of motion, no gross deformities. Extremities: No clubbing or cyanosis.   ECG: Most recent ECG reviewed.      ASSESSMENT AND PLAN:  Coronary artery disease s/p LAD DES and PTCA of D1  Symptomatically stable. Continue Imdur 60 mg daily, metoprolol, lisinopril 5 mg daily, ASA, Plavix, and Lipitor 40 mg daily.  He will begin cardiac rehabilitation in the near future.  Essential hypertension  His blood pressure is well controlled. He previously developed a dry cough with lisinopril and I switched to losartan 25 mg daily.  Chronic systolic heart failure, EF 50%  Euvolemic currently, on Lasix 80  mg q am and 40 mg q pm. On beta blocker and ARB.  Hyperlipidemia On 02/16/14, TC 127, TG 180, HDL 27, LDL 64. Continue Lipitor 40 mg daily.   Dispo: f/u 6 months.  Time spent: 40 minutes, of which greater than 50% was spent reviewing symptoms, relevant blood tests and studies, and discussing management plan with the patient.   Kate Sable, M.D., F.A.C.C.

## 2014-07-06 NOTE — Patient Instructions (Signed)

## 2014-07-10 ENCOUNTER — Encounter (HOSPITAL_COMMUNITY): Payer: Self-pay

## 2014-07-11 ENCOUNTER — Encounter: Payer: Medicare FFS | Admitting: Cardiovascular Disease

## 2014-07-12 ENCOUNTER — Encounter (HOSPITAL_COMMUNITY)
Admission: RE | Admit: 2014-07-12 | Discharge: 2014-07-12 | Disposition: A | Discharge status: Home / Self Care | Payer: Self-pay | Source: Ambulatory Visit | Attending: Cardiovascular Disease | Admitting: Cardiovascular Disease

## 2014-07-12 DIAGNOSIS — I2510 Atherosclerotic heart disease of native coronary artery without angina pectoris: Secondary | ICD-10-CM | POA: Insufficient documentation

## 2014-07-14 ENCOUNTER — Encounter (HOSPITAL_COMMUNITY): Payer: Self-pay

## 2014-07-17 ENCOUNTER — Encounter: Payer: Medicare FFS | Admitting: Cardiovascular Disease

## 2014-07-17 ENCOUNTER — Encounter (HOSPITAL_COMMUNITY)
Admission: RE | Admit: 2014-07-17 | Discharge: 2014-07-17 | Disposition: A | Discharge status: Home / Self Care | Payer: Self-pay | Source: Ambulatory Visit | Attending: Cardiovascular Disease | Admitting: Cardiovascular Disease

## 2014-07-19 ENCOUNTER — Encounter: Payer: Medicare FFS | Admitting: Cardiovascular Disease

## 2014-07-19 ENCOUNTER — Encounter (HOSPITAL_COMMUNITY)
Admission: RE | Admit: 2014-07-19 | Discharge: 2014-07-19 | Disposition: A | Discharge status: Home / Self Care | Payer: Self-pay | Source: Ambulatory Visit | Attending: Cardiovascular Disease | Admitting: Cardiovascular Disease

## 2014-07-21 ENCOUNTER — Encounter (HOSPITAL_COMMUNITY): Payer: Self-pay

## 2014-07-24 ENCOUNTER — Encounter (HOSPITAL_COMMUNITY): Payer: Self-pay

## 2014-07-26 ENCOUNTER — Encounter (HOSPITAL_COMMUNITY): Payer: Self-pay

## 2014-07-28 ENCOUNTER — Encounter (HOSPITAL_COMMUNITY): Payer: Self-pay

## 2014-07-31 ENCOUNTER — Encounter (HOSPITAL_COMMUNITY): Payer: Self-pay

## 2014-08-02 ENCOUNTER — Encounter (HOSPITAL_COMMUNITY): Payer: Self-pay

## 2014-08-04 ENCOUNTER — Encounter (HOSPITAL_COMMUNITY): Payer: Self-pay

## 2014-08-07 ENCOUNTER — Encounter (HOSPITAL_COMMUNITY): Payer: Self-pay

## 2014-08-09 ENCOUNTER — Encounter (HOSPITAL_COMMUNITY): Payer: Self-pay

## 2014-08-11 ENCOUNTER — Encounter (HOSPITAL_COMMUNITY): Payer: Self-pay

## 2014-08-14 ENCOUNTER — Encounter (HOSPITAL_COMMUNITY): Payer: Self-pay

## 2014-08-16 ENCOUNTER — Encounter (HOSPITAL_COMMUNITY): Payer: Self-pay

## 2014-08-18 ENCOUNTER — Encounter (HOSPITAL_COMMUNITY): Payer: Self-pay

## 2014-08-21 ENCOUNTER — Encounter (HOSPITAL_COMMUNITY): Payer: Self-pay

## 2014-08-22 ENCOUNTER — Other Ambulatory Visit: Payer: Self-pay | Admitting: *Deleted

## 2014-08-22 MED ORDER — LOSARTAN POTASSIUM 25 MG PO TABS: 25.0000 mg | ORAL_TABLET | Freq: Every day | ORAL | Status: DC

## 2014-08-22 NOTE — Telephone Encounter (Signed)
Losartan refilled for 90 day to [facility] at patient request.

## 2014-08-23 ENCOUNTER — Encounter (HOSPITAL_COMMUNITY): Payer: Self-pay

## 2014-08-25 ENCOUNTER — Encounter (HOSPITAL_COMMUNITY): Payer: Self-pay

## 2014-08-28 ENCOUNTER — Encounter (HOSPITAL_COMMUNITY): Payer: Self-pay

## 2014-08-30 ENCOUNTER — Encounter (HOSPITAL_COMMUNITY): Payer: Self-pay

## 2014-09-01 ENCOUNTER — Encounter (HOSPITAL_COMMUNITY): Payer: Self-pay

## 2014-12-15 ENCOUNTER — Other Ambulatory Visit: Payer: Self-pay | Admitting: Cardiovascular Disease

## 2015-01-03 ENCOUNTER — Encounter: Payer: Self-pay | Admitting: *Deleted

## 2015-01-04 ENCOUNTER — Encounter: Payer: Self-pay | Admitting: Cardiovascular Disease

## 2015-01-04 ENCOUNTER — Encounter: Payer: Self-pay | Admitting: *Deleted

## 2015-01-04 ENCOUNTER — Ambulatory Visit (INDEPENDENT_AMBULATORY_CARE_PROVIDER_SITE_OTHER): Payer: Medicare FFS | Admitting: Cardiovascular Disease

## 2015-01-04 ENCOUNTER — Other Ambulatory Visit: Payer: Self-pay | Admitting: Cardiovascular Disease

## 2015-01-04 VITALS — BP 129/81 | HR 69 | Ht 69.0 in | Wt 274.0 lb

## 2015-01-04 DIAGNOSIS — E785 Hyperlipidemia, unspecified: Secondary | ICD-10-CM

## 2015-01-04 DIAGNOSIS — R0609 Other forms of dyspnea: Principal | ICD-10-CM

## 2015-01-04 DIAGNOSIS — I25118 Atherosclerotic heart disease of native coronary artery with other forms of angina pectoris: Secondary | ICD-10-CM | POA: Diagnosis not present

## 2015-01-04 DIAGNOSIS — I252 Old myocardial infarction: Secondary | ICD-10-CM

## 2015-01-04 DIAGNOSIS — Z9989 Dependence on other enabling machines and devices: Secondary | ICD-10-CM

## 2015-01-04 DIAGNOSIS — R5383 Other fatigue: Secondary | ICD-10-CM

## 2015-01-04 DIAGNOSIS — I10 Essential (primary) hypertension: Secondary | ICD-10-CM | POA: Diagnosis not present

## 2015-01-04 DIAGNOSIS — R0602 Shortness of breath: Secondary | ICD-10-CM

## 2015-01-04 DIAGNOSIS — I5022 Chronic systolic (congestive) heart failure: Secondary | ICD-10-CM

## 2015-01-04 DIAGNOSIS — Z955 Presence of coronary angioplasty implant and graft: Secondary | ICD-10-CM

## 2015-01-04 DIAGNOSIS — G4733 Obstructive sleep apnea (adult) (pediatric): Secondary | ICD-10-CM

## 2015-01-04 NOTE — Addendum Note (Signed)
Addended by: Laurine Blazer on: 01/04/2015 03:57 PM   Modules accepted: Orders

## 2015-01-04 NOTE — Progress Notes (Signed)
Patient ID: [name], male   DOB: April 13, 1951, 64 y.o.   MRN: [id number]      SUBJECTIVE: The patient returns for routine follow up. He had drug-eluting stent placement to the mid LAD and angioplasty of the first diagonal branch. During his coronary intervention, there was significant coronary vessel spasm at the beginning of the case and at several times during the case which resolved with nitroglycerin. After being discharged, he called our office complaining of chest pain which resolved with one sublingual nitroglycerin tablet. He also had some dyspnea associated with this. Ultimately, he had realized that all these problems began with the initiation of Brilinta. He has a past history of panic attacks and began experiencing panic attacks while in the hospital. He takes Klonopin for this which quickly helps to alleviate these. After he stopped taking Brilinta and resumed taking Plavix, he felt better. He has chronic pain and takes Motrin when he becomes unbearable. He is managed by a neurologist for this.  Since his last visit with me on 2/25, he fell and broke his left wrist while on holiday at [facility] a few weeks ago.  While he was at his home in May, he suddenly experienced profound fatigue (no chest pain) and since that time, he has been remarkably fatigued and short of breath with any significant exertion.    ECG performed in the office today demonstrates normal sinus rhythm with a mild nonspecific T wave abnormality in lead aVL. T waves were previously upright in aVL ECG in February. 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  Soc: Married to [place], who is a Marine scientist.    Review of Systems: As per "subjective", otherwise negative.  Allergies  Allergen Reactions  . Duloxetine Swelling    CHF  . Fentanyl Other (See Comments)    Confusion, panic attacks  . Lidocaine Other (See Comments)    REACTION: non-reactive  . Meperidine Hcl Other (See Comments)    REACTION: causes extreme  mental reactions.  . Methadone Other (See Comments)    REACTION: causes heart failure  . Oxycodone Hcl Other (See Comments)    REACTION: causes heart failure  . Pregabalin Other (See Comments)    REACTION: chf  . Zolpidem Tartrate Other (See Comments)    REACTION: confusion    Current Outpatient Prescriptions  Medication Sig Dispense Refill  . aspirin EC 81 MG tablet Take 81 mg by mouth every morning.    Marland Kitchen atorvastatin (LIPITOR) 40 MG tablet TAKE 1 TABLET EVERY DAY 90 tablet 3  . cholecalciferol (VITAMIN D) 1000 UNITS tablet Take 1,000 Units by mouth every morning.    . clonazePAM (KLONOPIN) 0.5 MG tablet Take 0.5 mg by mouth 3 (three) times daily as needed for anxiety.    . clopidogrel (PLAVIX) 75 MG tablet     . CO ENZYME Q-10 PO Take 1 tablet by mouth every evening.    . diphenhydrAMINE (BENADRYL) 25 MG tablet Take 25-50 mg by mouth 2 (two) times daily as needed for itching.     . furosemide (LASIX) 40 MG tablet Take 40 mg by mouth 3 (three) times daily. Take 2 tablets in AM and 1 tablet in PM.    . gabapentin (NEURONTIN) 600 MG tablet Take 1,800 mg by mouth 2 (two) times daily.     Marland Kitchen HYDROmorphone (DILAUDID) 4 MG tablet Take 1 tablet by mouth 3 (three) times daily as needed.     . isometheptene-acetaminophen-dichloralphenazone (MIDRIN) 65-325-100 MG capsule Take 3-4 capsules by  mouth as directed. Maximum 5 capsules in 12 hours for migraine headaches, 8 capsules in 24 hours for tension headaches.    . isosorbide mononitrate (IMDUR) 60 MG 24 hr tablet Take 1 tablet (60 mg total) by mouth daily. 90 tablet 3  . L-Lysine 500 MG TABS Take 500 mg by mouth 2 (two) times daily.     . Loperamide HCl (IMODIUM A-D PO) Take 3 capsules by mouth daily as needed (chronic diarrhea).    . losartan (COZAAR) 25 MG tablet Take 1 tablet (25 mg total) by mouth daily. 90 tablet 3  . metoprolol succinate (TOPROL-XL) 100 MG 24 hr tablet Take 100 mg by mouth daily. Take with or immediately following a meal.      . nitroGLYCERIN (NITROSTAT) 0.4 MG SL tablet Place 1 tablet (0.4 mg total) under the tongue every 5 (five) minutes as needed. For chest discomfort 25 tablet 3  . NON FORMULARY CPAP    . omeprazole (PRILOSEC) 40 MG capsule Take 40 mg by mouth daily before breakfast.     . OVER THE COUNTER MEDICATION Take 1 tablet by mouth 2 (two) times daily. Tumeric    . polyethylene glycol (MIRALAX / GLYCOLAX) packet Take 17 g by mouth daily as needed.    . polyvinyl alcohol-povidone (HYPOTEARS) 1.4-0.6 % ophthalmic solution Place 1-2 drops into both eyes daily as needed (dry eyes). For dry eyes    . potassium chloride (K-DUR) 10 MEQ tablet Take 10-20 mEq by mouth 2 (two) times daily. 2 tablets in AM and 1 tablet in PM    . ticagrelor (BRILINTA) 90 MG TABS tablet Take 1 tablet (90 mg total) by mouth 2 (two) times daily. 60 tablet 0  . vitamin C (ASCORBIC ACID) 500 MG tablet Take 500 mg by mouth every morning.      No current facility-administered medications for this visit.    Past Medical History  Diagnosis Date  . Coronary artery disease     STENT X 1 2010 Mid LAD  . Neuropathy   . Fibromyalgia   . Penile lesion   . GERD (gastroesophageal reflux disease)   . History of kidney stones   . Sleep apnea     USES C-PAP  . Anxiety     HX PANIC ATTACKS  . History of vertigo   . Sjogren's disease   . Narcolepsy   . Headache(784.0)   . Chronic cough   . Dry eyes   . Fluid retention   . Hypertension   . CHF (congestive heart failure)     Past Surgical History  Procedure Laterality Date  . Meatotomy  X2  . Hernia repair      ING HERNIA REPAIR  . Circumcision    . Vasectomy    . Breast surgery      BIL MASTECTOMY  FOR BENIGN LUMPS  . Carpal tunnell  BIL  . Wrist fracture surgery  L  X3 SURGERIES  . Coronary angioplasty with stent placement  X1 2010  . Left heart catheterization with coronary angiogram N/A 10/11/2013    Procedure: LEFT HEART CATHETERIZATION WITH CORONARY ANGIOGRAM;  Surgeon:  Sinclair Grooms, MD;  [facility] C[facility] LAB;  Service: Cardiovascular;  Laterality: N/A;  . Left heart catheterization with coronary angiogram N/A 06/21/2014    Procedure: LEFT HEART CATHETERIZATION WITH CORONARY ANGIOGRAM;  Surgeon: Troy Sine, MD;  Location: [facility] CATH LAB;  Service: Cardiovascular;  Laterality: N/A;  . Percutaneous coronary stent intervention (pci-s) N/A 06/23/2014  Procedure: PERCUTANEOUS CORONARY STENT INTERVENTION (PCI-S);  Surgeon: Jettie Booze, MD;  Location: [facility] CATH LAB;  Service: Cardiovascular;  Laterality: N/A;    Social History   Social History  . Marital Status: Married    Spouse Name: N/A  . Number of Children: N/A  . Years of Education: N/A   Occupational History  . Not on file.   Social History Main Topics  . Smoking status: Former Smoker -- 2.50 packs/day for 32 years    Types: Cigarettes    Start date: 04/17/1967    Quit date: 05/12/2000  . Smokeless tobacco: Never Used  . 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 Drug Use: No  . Sexual Activity: Not on file   Other Topics Concern  . Not on file   Social History Narrative     Filed Vitals:   01/04/15 1518  BP: 129/81  Pulse: 69  Height: 5\' 9"  (1.753 m)  Weight: 274 lb (124.286 kg)    PHYSICAL EXAM General: NAD HEENT: Normal. Neck: No JVD, no thyromegaly. Lungs: Clear to auscultation bilaterally with normal respiratory effort. CV: Nondisplaced PMI. Regular rate and rhythm, normal S1/S2, no S3/S4, no murmur. No pretibial or periankle edema. No carotid bruit. Normal pedal pulses.  Abdomen: Soft, nontender, obese, no distention.  Neurologic: Alert and oriented x 3.  Psych: Normal affect. Skin: Normal. Musculoskeletal: Normal range of motion, no gross deformities. Extremities: No clubbing or cyanosis.   ECG: Most recent ECG reviewed.      ASSESSMENT AND PLAN:  Fatigue and shortness of breath in context of coronary artery disease s/p  LAD DES and PTCA of D1  Significant decline in ability to exert himself since May. ECG shows a nonspecific T wave abnormality in aVL (T waves previously upright), suspicious for diagonal branch disease. Will obtain echocardiogram to assess left ventricular function and regional wall motion. Will also arrange for coronary angiography to see if D1 is still patent. Continue Imdur 60 mg daily, metoprolol, lisinopril 5 mg daily, ASA, Plavix, and Lipitor 40 mg daily.   Essential hypertension  His blood pressure is well controlled. He previously developed a dry cough with lisinopril and I switched to losartan 25 mg daily.  Chronic systolic heart failure, EF 50%  Euvolemic currently, on Lasix 80 mg q am and 40 mg q pm. On beta blocker and ARB. Repeat echocardiogram to assess for interval change in LVEF.  Hyperlipidemia On 02/16/14, TC 127, TG 180, HDL 27, LDL 64. Continue Lipitor 40 mg daily.   Dispo: f/u after cath.  Time spent: 40 minutes, of which greater than 50% was spent reviewing symptoms, relevant blood tests and studies, and discussing management plan with the patient.   Kate Sable, M.D., F.A.C.C.

## 2015-01-04 NOTE — Patient Instructions (Signed)
Your physician has requested that you have a cardiac catheterization. Cardiac catheterization is used to diagnose and/or treat various heart conditions. Doctors may recommend this procedure for a number of different reasons. The most common reason is to evaluate chest pain. Chest pain can be a symptom of coronary artery disease (CAD), and cardiac catheterization can show whether plaque is narrowing or blocking your heart's arteries. This procedure is also used to evaluate the valves, as well as measure the blood flow and oxygen levels in different parts of your heart. For further information please visit [email or web address]. Please follow instruction sheet, as given. Your physician has requested that you have an echocardiogram. Echocardiography is a painless test that uses sound waves to create images of your heart. It provides your doctor with information about the size and shape of your heart and how well your heart's chambers and valves are working. This procedure takes approximately one hour. There are no restrictions for this procedure. Office will contact with results via phone or letter.   Continue all current medications. Follow up will be given at time of discharge from heart cath.

## 2015-01-05 NOTE — Addendum Note (Signed)
Addended by: Merlene Laughter on: 01/05/2015 10:00 AM   Modules accepted: Orders

## 2015-01-08 ENCOUNTER — Ambulatory Visit (HOSPITAL_COMMUNITY)
Admission: RE | Admit: 2015-01-08 | Discharge: 2015-01-08 | Disposition: A | Discharge status: Home / Self Care | Payer: Medicare FFS | Source: Ambulatory Visit | Attending: Cardiovascular Disease | Admitting: Cardiovascular Disease

## 2015-01-08 DIAGNOSIS — I25118 Atherosclerotic heart disease of native coronary artery with other forms of angina pectoris: Secondary | ICD-10-CM | POA: Diagnosis not present

## 2015-01-08 DIAGNOSIS — R0602 Shortness of breath: Secondary | ICD-10-CM

## 2015-01-08 DIAGNOSIS — I2510 Atherosclerotic heart disease of native coronary artery without angina pectoris: Secondary | ICD-10-CM | POA: Diagnosis not present

## 2015-01-09 ENCOUNTER — Encounter (HOSPITAL_COMMUNITY)
Admission: RE | Disposition: A | Discharge status: Home / Self Care | Payer: Self-pay | Source: Ambulatory Visit | Attending: Cardiology

## 2015-01-09 ENCOUNTER — Ambulatory Visit (HOSPITAL_COMMUNITY)
Admission: RE | Admit: 2015-01-09 | Discharge: 2015-01-09 | Disposition: A | Discharge status: Home / Self Care | Payer: Medicare FFS | Source: Ambulatory Visit | Attending: Cardiology | Admitting: Cardiology

## 2015-01-09 DIAGNOSIS — Z955 Presence of coronary angioplasty implant and graft: Secondary | ICD-10-CM | POA: Diagnosis not present

## 2015-01-09 DIAGNOSIS — G629 Polyneuropathy, unspecified: Secondary | ICD-10-CM | POA: Insufficient documentation

## 2015-01-09 DIAGNOSIS — G8929 Other chronic pain: Secondary | ICD-10-CM | POA: Diagnosis not present

## 2015-01-09 DIAGNOSIS — I5022 Chronic systolic (congestive) heart failure: Secondary | ICD-10-CM | POA: Insufficient documentation

## 2015-01-09 DIAGNOSIS — G4730 Sleep apnea, unspecified: Secondary | ICD-10-CM | POA: Diagnosis not present

## 2015-01-09 DIAGNOSIS — E785 Hyperlipidemia, unspecified: Secondary | ICD-10-CM | POA: Diagnosis not present

## 2015-01-09 DIAGNOSIS — K219 Gastro-esophageal reflux disease without esophagitis: Secondary | ICD-10-CM | POA: Insufficient documentation

## 2015-01-09 DIAGNOSIS — I25119 Atherosclerotic heart disease of native coronary artery with unspecified angina pectoris: Secondary | ICD-10-CM | POA: Diagnosis not present

## 2015-01-09 DIAGNOSIS — I10 Essential (primary) hypertension: Secondary | ICD-10-CM | POA: Insufficient documentation

## 2015-01-09 DIAGNOSIS — Z7982 Long term (current) use of aspirin: Secondary | ICD-10-CM | POA: Diagnosis not present

## 2015-01-09 DIAGNOSIS — I25118 Atherosclerotic heart disease of native coronary artery with other forms of angina pectoris: Secondary | ICD-10-CM

## 2015-01-09 DIAGNOSIS — R5383 Other fatigue: Secondary | ICD-10-CM

## 2015-01-09 DIAGNOSIS — M797 Fibromyalgia: Secondary | ICD-10-CM | POA: Diagnosis not present

## 2015-01-09 DIAGNOSIS — R0609 Other forms of dyspnea: Secondary | ICD-10-CM | POA: Diagnosis present

## 2015-01-09 DIAGNOSIS — Z87891 Personal history of nicotine dependence: Secondary | ICD-10-CM | POA: Insufficient documentation

## 2015-01-09 DIAGNOSIS — Z9861 Coronary angioplasty status: Secondary | ICD-10-CM

## 2015-01-09 DIAGNOSIS — G47419 Narcolepsy without cataplexy: Secondary | ICD-10-CM | POA: Diagnosis not present

## 2015-01-09 DIAGNOSIS — I209 Angina pectoris, unspecified: Secondary | ICD-10-CM | POA: Diagnosis present

## 2015-01-09 DIAGNOSIS — Z87442 Personal history of urinary calculi: Secondary | ICD-10-CM | POA: Diagnosis not present

## 2015-01-09 DIAGNOSIS — I2510 Atherosclerotic heart disease of native coronary artery without angina pectoris: Secondary | ICD-10-CM

## 2015-01-09 DIAGNOSIS — F419 Anxiety disorder, unspecified: Secondary | ICD-10-CM | POA: Insufficient documentation

## 2015-01-09 DIAGNOSIS — I214 Non-ST elevation (NSTEMI) myocardial infarction: Secondary | ICD-10-CM | POA: Diagnosis present

## 2015-01-09 DIAGNOSIS — Z7902 Long term (current) use of antithrombotics/antiplatelets: Secondary | ICD-10-CM | POA: Diagnosis not present

## 2015-01-09 DIAGNOSIS — M3500 Sicca syndrome, unspecified: Secondary | ICD-10-CM | POA: Diagnosis not present

## 2015-01-09 HISTORY — PX: CARDIAC CATHETERIZATION: SHX172

## 2015-01-09 LAB — BASIC METABOLIC PANEL
Anion gap: 10 (ref 5–15)
BUN: 8 mg/dL (ref 6–20)
CHLORIDE: 102 mmol/L (ref 101–111)
CO2: 25 mmol/L (ref 22–32)
Calcium: 8.7 mg/dL — ABNORMAL LOW (ref 8.9–10.3)
Creatinine, Ser: 0.92 mg/dL (ref 0.61–1.24)
GFR calc non Af Amer: >60 mL/min (ref >60)
Glucose, Bld: 113 mg/dL — ABNORMAL HIGH (ref 65–99)
POTASSIUM: 4.1 mmol/L (ref 3.5–5.1)
SODIUM: 137 mmol/L (ref 135–145)

## 2015-01-09 LAB — CBC
HEMATOCRIT: 37.1 % — AB (ref 39.0–52.0)
Hemoglobin: 12.9 g/dL — ABNORMAL LOW (ref 13.0–17.0)
MCH: 33.3 pg (ref 26.0–34.0)
MCHC: 34.8 g/dL (ref 30.0–36.0)
MCV: 95.9 fL (ref 78.0–100.0)
PLATELETS: 193 10*3/uL (ref 150–400)
RBC: 3.87 MIL/uL — AB (ref 4.22–5.81)
RDW: 13.1 % (ref 11.5–15.5)
WBC: 6.9 10*3/uL (ref 4.0–10.5)

## 2015-01-09 LAB — PROTIME-INR
INR: 1.08 (ref 0.00–1.49)
Prothrombin Time: 14.2 seconds (ref 11.6–15.2)

## 2015-01-09 SURGERY — LEFT HEART CATH AND CORONARY ANGIOGRAPHY
Anesthesia: LOCAL

## 2015-01-09 MED ORDER — ACETAMINOPHEN 325 MG PO TABS: 650.0000 mg | ORAL_TABLET | ORAL | Status: DC | PRN

## 2015-01-09 MED ORDER — SODIUM CHLORIDE 0.9 % IJ SOLN: 3.0000 mL | Freq: Two times a day (BID) | INTRAMUSCULAR | Status: DC

## 2015-01-09 MED ORDER — ONDANSETRON HCL 4 MG/2ML IJ SOLN: 4.0000 mg | Freq: Four times a day (QID) | INTRAMUSCULAR | Status: DC | PRN

## 2015-01-09 MED ORDER — LIDOCAINE HCL (PF) 1 % IJ SOLN
INTRAMUSCULAR | Status: AC
  Filled 2015-01-09: qty 30

## 2015-01-09 MED ORDER — FENTANYL CITRATE (PF) 100 MCG/2ML IJ SOLN
INTRAMUSCULAR | Status: DC | PRN
  Administered 2015-01-09: 25 ug via INTRAVENOUS

## 2015-01-09 MED ORDER — FENTANYL CITRATE (PF) 100 MCG/2ML IJ SOLN
INTRAMUSCULAR | Status: AC
  Filled 2015-01-09: qty 4

## 2015-01-09 MED ORDER — MIDAZOLAM HCL 2 MG/2ML IJ SOLN
INTRAMUSCULAR | Status: AC
  Filled 2015-01-09: qty 4

## 2015-01-09 MED ORDER — ASPIRIN 81 MG PO CHEW
81.0000 mg | CHEWABLE_TABLET | ORAL | Status: AC
Start: 2015-01-10 — End: 2015-01-09
  Administered 2015-01-09: 81 mg via ORAL

## 2015-01-09 MED ORDER — MIDAZOLAM HCL 2 MG/2ML IJ SOLN
INTRAMUSCULAR | Status: DC | PRN
Start: 2015-01-09 — End: 2015-01-09
  Administered 2015-01-09: 1 mg via INTRAVENOUS

## 2015-01-09 MED ORDER — ASPIRIN 81 MG PO CHEW
CHEWABLE_TABLET | ORAL | Status: AC
  Administered 2015-01-09: 81 mg via ORAL
  Filled 2015-01-09: qty 1

## 2015-01-09 MED ORDER — SODIUM CHLORIDE 0.9 % IJ SOLN: 3.0000 mL | INTRAMUSCULAR | Status: DC | PRN

## 2015-01-09 MED ORDER — HEPARIN SODIUM (PORCINE) 1000 UNIT/ML IJ SOLN
INTRAMUSCULAR | Status: AC
  Filled 2015-01-09: qty 1

## 2015-01-09 MED ORDER — NITROGLYCERIN 1 MG/10 ML FOR IR/CATH LAB
INTRA_ARTERIAL | Status: DC | PRN
  Administered 2015-01-09: 16:00:00

## 2015-01-09 MED ORDER — HEPARIN (PORCINE) IN NACL 2-0.9 UNIT/ML-% IJ SOLN
INTRAMUSCULAR | Status: AC
  Filled 2015-01-09: qty 1500

## 2015-01-09 MED ORDER — SODIUM CHLORIDE 0.9 % WEIGHT BASED INFUSION: 1.0000 mL/kg/h | INTRAVENOUS | Status: DC

## 2015-01-09 MED ORDER — NITROGLYCERIN 1 MG/10 ML FOR IR/CATH LAB
INTRA_ARTERIAL | Status: AC
  Filled 2015-01-09: qty 10

## 2015-01-09 MED ORDER — SODIUM CHLORIDE 0.9 % IV SOLN: 250.0000 mL | INTRAVENOUS | Status: DC | PRN

## 2015-01-09 MED ORDER — SODIUM CHLORIDE 0.9 % IV SOLN
INTRAVENOUS | Status: DC
  Administered 2015-01-09: 12:00:00 via INTRAVENOUS

## 2015-01-09 MED ORDER — CLOPIDOGREL BISULFATE 75 MG PO TABS: 75.0000 mg | ORAL_TABLET | ORAL | Status: DC

## 2015-01-09 MED ORDER — VERAPAMIL HCL 2.5 MG/ML IV SOLN
INTRAVENOUS | Status: AC
  Filled 2015-01-09: qty 2

## 2015-01-09 MED ORDER — IOHEXOL 350 MG/ML SOLN
INTRAVENOUS | Status: DC | PRN
  Administered 2015-01-09: 100 mL via INTRA_ARTERIAL

## 2015-01-09 MED ORDER — HEPARIN SODIUM (PORCINE) 1000 UNIT/ML IJ SOLN
INTRAMUSCULAR | Status: DC | PRN
  Administered 2015-01-09: 6000 [IU] via INTRAVENOUS

## 2015-01-09 MED ORDER — RADIAL COCKTAIL (HEPARIN/VERAPAMIL/LIDOCAINE/NITRO)
Status: DC | PRN
  Administered 2015-01-09: 20 mL via INTRA_ARTERIAL

## 2015-01-09 SURGICAL SUPPLY — 10 items
CATH INFINITI 5FR ANG PIGTAIL (CATHETERS) ×2 IMPLANT
CATH OPTITORQUE TIG 4.0 5F (CATHETERS) ×2 IMPLANT
DEVICE RAD COMP TR BAND LRG (VASCULAR PRODUCTS) ×2 IMPLANT
GLIDESHEATH SLEND A-KIT 6F 22G (SHEATH) ×2 IMPLANT
KIT HEART LEFT (KITS) ×2 IMPLANT
PACK CARDIAC CATHETERIZATION (CUSTOM PROCEDURE TRAY) ×2 IMPLANT
SYR MEDRAD MARK V 150ML (SYRINGE) ×2 IMPLANT
TRANSDUCER W/STOPCOCK (MISCELLANEOUS) ×2 IMPLANT
TUBING CIL FLEX 10 FLL-RA (TUBING) ×2 IMPLANT
WIRE SAFE-T 1.5MM-J .035X260CM (WIRE) ×2 IMPLANT

## 2015-01-09 NOTE — H&P (View-Only) (Signed)
Patient ID: [name], male   DOB: 11/30/1950, 64 y.o.   MRN: [id number]      SUBJECTIVE: The patient returns for routine follow up. He had drug-eluting stent placement to the mid LAD and angioplasty of the first diagonal branch. During his coronary intervention, there was significant coronary vessel spasm at the beginning of the case and at several times during the case which resolved with nitroglycerin. After being discharged, he called our office complaining of chest pain which resolved with one sublingual nitroglycerin tablet. He also had some dyspnea associated with this. Ultimately, he had realized that all these problems began with the initiation of Brilinta. He has a past history of panic attacks and began experiencing panic attacks while in the hospital. He takes Klonopin for this which quickly helps to alleviate these. After he stopped taking Brilinta and resumed taking Plavix, he felt better. He has chronic pain and takes Motrin when he becomes unbearable. He is managed by a neurologist for this.  Since his last visit with me on 2/25, he fell and broke his left wrist while on holiday at [place] a few weeks ago.  While he was at his home in May, he suddenly experienced profound fatigue (no chest pain) and since that time, he has been remarkably fatigued and short of breath with any significant exertion.    ECG performed in the office today demonstrates normal sinus rhythm with a mild nonspecific T wave abnormality in lead aVL. T waves were previously upright in aVL ECG in February. 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  Soc: Married to Katie, who is a nurse.    Review of Systems: As per "subjective", otherwise negative.  Allergies  Allergen Reactions  . Duloxetine Swelling    CHF  . Fentanyl Other (See Comments)    Confusion, panic attacks  . Lidocaine Other (See Comments)    REACTION: non-reactive  . Meperidine Hcl Other (See Comments)    REACTION: causes extreme  mental reactions.  . Methadone Other (See Comments)    REACTION: causes heart failure  . Oxycodone Hcl Other (See Comments)    REACTION: causes heart failure  . Pregabalin Other (See Comments)    REACTION: chf  . Zolpidem Tartrate Other (See Comments)    REACTION: confusion    Current Outpatient Prescriptions  Medication Sig Dispense Refill  . aspirin EC 81 MG tablet Take 81 mg by mouth every morning.    . atorvastatin (LIPITOR) 40 MG tablet TAKE 1 TABLET EVERY DAY 90 tablet 3  . cholecalciferol (VITAMIN D) 1000 UNITS tablet Take 1,000 Units by mouth every morning.    . clonazePAM (KLONOPIN) 0.5 MG tablet Take 0.5 mg by mouth 3 (three) times daily as needed for anxiety.    . clopidogrel (PLAVIX) 75 MG tablet     . CO ENZYME Q-10 PO Take 1 tablet by mouth every evening.    . diphenhydrAMINE (BENADRYL) 25 MG tablet Take 25-50 mg by mouth 2 (two) times daily as needed for itching.     . furosemide (LASIX) 40 MG tablet Take 40 mg by mouth 3 (three) times daily. Take 2 tablets in AM and 1 tablet in PM.    . gabapentin (NEURONTIN) 600 MG tablet Take 1,800 mg by mouth 2 (two) times daily.     . HYDROmorphone (DILAUDID) 4 MG tablet Take 1 tablet by mouth 3 (three) times daily as needed.     . isometheptene-acetaminophen-dichloralphenazone (MIDRIN) 65-325-100 MG capsule Take 3-4 capsules by   mouth as directed. Maximum 5 capsules in 12 hours for migraine headaches, 8 capsules in 24 hours for tension headaches.    . isosorbide mononitrate (IMDUR) 60 MG 24 hr tablet Take 1 tablet (60 mg total) by mouth daily. 90 tablet 3  . L-Lysine 500 MG TABS Take 500 mg by mouth 2 (two) times daily.     . Loperamide HCl (IMODIUM A-D PO) Take 3 capsules by mouth daily as needed (chronic diarrhea).    . losartan (COZAAR) 25 MG tablet Take 1 tablet (25 mg total) by mouth daily. 90 tablet 3  . metoprolol succinate (TOPROL-XL) 100 MG 24 hr tablet Take 100 mg by mouth daily. Take with or immediately following a meal.      . nitroGLYCERIN (NITROSTAT) 0.4 MG SL tablet Place 1 tablet (0.4 mg total) under the tongue every 5 (five) minutes as needed. For chest discomfort 25 tablet 3  . NON FORMULARY CPAP    . omeprazole (PRILOSEC) 40 MG capsule Take 40 mg by mouth daily before breakfast.     . OVER THE COUNTER MEDICATION Take 1 tablet by mouth 2 (two) times daily. Tumeric    . polyethylene glycol (MIRALAX / GLYCOLAX) packet Take 17 g by mouth daily as needed.    . polyvinyl alcohol-povidone (HYPOTEARS) 1.4-0.6 % ophthalmic solution Place 1-2 drops into both eyes daily as needed (dry eyes). For dry eyes    . potassium chloride (K-DUR) 10 MEQ tablet Take 10-20 mEq by mouth 2 (two) times daily. 2 tablets in AM and 1 tablet in PM    . ticagrelor (BRILINTA) 90 MG TABS tablet Take 1 tablet (90 mg total) by mouth 2 (two) times daily. 60 tablet 0  . vitamin C (ASCORBIC ACID) 500 MG tablet Take 500 mg by mouth every morning.      No current facility-administered medications for this visit.    Past Medical History  Diagnosis Date  . Coronary artery disease     STENT X 1 2010 Mid LAD  . Neuropathy   . Fibromyalgia   . Penile lesion   . GERD (gastroesophageal reflux disease)   . History of kidney stones   . Sleep apnea     USES C-PAP  . Anxiety     HX PANIC ATTACKS  . History of vertigo   . Sjogren's disease   . Narcolepsy   . Headache(784.0)   . Chronic cough   . Dry eyes   . Fluid retention   . Hypertension   . CHF (congestive heart failure)     Past Surgical History  Procedure Laterality Date  . Meatotomy  X2  . Hernia repair      ING HERNIA REPAIR  . Circumcision    . Vasectomy    . Breast surgery      BIL MASTECTOMY  FOR BENIGN LUMPS  . Carpal tunnell  BIL  . Wrist fracture surgery  L  X3 SURGERIES  . Coronary angioplasty with stent placement  X1 2010  . Left heart catheterization with coronary angiogram N/A 10/11/2013    Procedure: LEFT HEART CATHETERIZATION WITH CORONARY ANGIOGRAM;  Surgeon:  Henry W Smith III, MD;  Location: [facility] CATH LAB;  Service: Cardiovascular;  Laterality: N/A;  . Left heart catheterization with coronary angiogram N/A 06/21/2014    Procedure: LEFT HEART CATHETERIZATION WITH CORONARY ANGIOGRAM;  Surgeon: Thomas A Kelly, MD;  Location: [facility] CATH LAB;  Service: Cardiovascular;  Laterality: N/A;  . Percutaneous coronary stent intervention (pci-s) N/A 06/23/2014      Procedure: PERCUTANEOUS CORONARY STENT INTERVENTION (PCI-S);  Surgeon: Jayadeep S Varanasi, MD;  Location: [facility] CATH LAB;  Service: Cardiovascular;  Laterality: N/A;    Social History   Social History  . Marital Status: Married    Spouse Name: N/A  . Number of Children: N/A  . Years of Education: N/A   Occupational History  . Not on file.   Social History Main Topics  . Smoking status: Former Smoker -- 2.50 packs/day for 32 years    Types: Cigarettes    Start date: 04/17/1967    Quit date: 05/12/2000  . Smokeless tobacco: Never Used  . Alcohol Use: No     Comment: RECOVERING ALCOHOLIC 9 YRS- 2003  . Drug Use: No  . Sexual Activity: Not on file   Other Topics Concern  . Not on file   Social History Narrative     Filed Vitals:   01/04/15 1518  BP: 129/81  Pulse: 69  Height: 5' 9" (1.753 m)  Weight: 274 lb (124.286 kg)    PHYSICAL EXAM General: NAD HEENT: Normal. Neck: No JVD, no thyromegaly. Lungs: Clear to auscultation bilaterally with normal respiratory effort. CV: Nondisplaced PMI. Regular rate and rhythm, normal S1/S2, no S3/S4, no murmur. No pretibial or periankle edema. No carotid bruit. Normal pedal pulses.  Abdomen: Soft, nontender, obese, no distention.  Neurologic: Alert and oriented x 3.  Psych: Normal affect. Skin: Normal. Musculoskeletal: Normal range of motion, no gross deformities. Extremities: No clubbing or cyanosis.   ECG: Most recent ECG reviewed.      ASSESSMENT AND PLAN:  Fatigue and shortness of breath in context of coronary artery disease s/p  LAD DES and PTCA of D1  Significant decline in ability to exert himself since May. ECG shows a nonspecific T wave abnormality in aVL (T waves previously upright), suspicious for diagonal branch disease. Will obtain echocardiogram to assess left ventricular function and regional wall motion. Will also arrange for coronary angiography to see if D1 is still patent. Continue Imdur 60 mg daily, metoprolol, lisinopril 5 mg daily, ASA, Plavix, and Lipitor 40 mg daily.   Essential hypertension  His blood pressure is well controlled. He previously developed a dry cough with lisinopril and I switched to losartan 25 mg daily.  Chronic systolic heart failure, EF 50%  Euvolemic currently, on Lasix 80 mg q am and 40 mg q pm. On beta blocker and ARB. Repeat echocardiogram to assess for interval change in LVEF.  Hyperlipidemia On 02/16/14, TC 127, TG 180, HDL 27, LDL 64. Continue Lipitor 40 mg daily.   Dispo: f/u after cath.  Time spent: 40 minutes, of which greater than 50% was spent reviewing symptoms, relevant blood tests and studies, and discussing management plan with the patient.   Venson Ferencz, M.D., F.A.C.C.  

## 2015-01-09 NOTE — Discharge Instructions (Signed)
Radial Site Care °Refer to this sheet in the next few weeks. These instructions provide you with information on caring for yourself after your procedure. Your caregiver may also give you more specific instructions. Your treatment has been planned according to current medical practices, but problems sometimes occur. Call your caregiver if you have any problems or questions after your procedure. °HOME CARE INSTRUCTIONS °· You may shower the day after the procedure. Remove the bandage (dressing) and gently wash the site with plain soap and water. Gently pat the site dry. °· Do not apply powder or lotion to the site. °· Do not submerge the affected site in water for 3 to 5 days. °· Inspect the site at least twice daily. °· Do not flex or bend the affected arm for 24 hours. °· No lifting over 5 pounds (2.3 kg) for 5 days after your procedure. °· Do not drive home if you are discharged the same day of the procedure. Have someone else drive you. °· You may drive 24 hours after the procedure unless otherwise instructed by your caregiver. °· Do not operate machinery or power tools for 24 hours. °· A responsible adult should be with you for the first 24 hours after you arrive home. °What to expect: °· Any bruising will usually fade within 1 to 2 weeks. °· Blood that collects in the tissue (hematoma) may be painful to the touch. It should usually decrease in size and tenderness within 1 to 2 weeks. °SEEK IMMEDIATE MEDICAL CARE IF: °· You have unusual pain at the radial site. °· You have redness, warmth, swelling, or pain at the radial site. °· You have drainage (other than a small amount of blood on the dressing). °· You have chills. °· You have a fever or persistent symptoms for more than 72 hours. °· You have a fever and your symptoms suddenly get worse. °· Your arm becomes pale, cool, tingly, or numb. °· You have heavy bleeding from the site. Hold pressure on the site. °Document Released: 05/31/2010 Document Revised:  07/21/2011 Document Reviewed: 05/31/2010 °[employer]® Patient Information ©2015 [employer]. This information is not intended to replace advice given to you by your health care provider. Make sure you discuss any questions you have with your health care provider. ° °

## 2015-01-09 NOTE — Interval H&P Note (Signed)
History and Physical Interval Note:  01/09/2015 2:54 PM  [name]  has presented today for surgery, with the diagnosis of cad s/p PCI, angina, sob -- Accelerated Angina.   The various methods of treatment have been discussed with the patient and family. After consideration of risks, benefits and other options for treatment, the patient has consented to  Procedure(s): Left Heart Cath and Coronary Angiography (N/A) wit Possible PCI as a surgical intervention .  The patient's history has been reviewed, patient examined, no change in status, stable for surgery.  I have reviewed the patient's chart and labs.  Questions were answered to the patient's satisfaction.     [name], [name]  Cath Lab Visit (complete for each Cath Lab visit)  Clinical Evaluation Leading to the Procedure:   ACS: No.  Non-ACS:    Anginal Classification: CCS III  Anti-ischemic medical therapy: Maximal Therapy (2 or more classes of medications)  Non-Invasive Test Results: No non-invasive testing performed  Prior CABG: No previous CABG   AUC Ischemic Symptoms? CCS III (Marked limitation of ordinary activity) Anti-ischemic Medical Therapy? Maximal Medical Therapy (2 or more classes of medications) Non-invasive Test Results? No non-invasive testing performed Prior CABG? No Previous CABG   Patient Information:   1-2V CAD, no prox LAD  A (7)  Indication: 20; Score: 7   Patient Information:   1-2V-CAD with DS 50-60% With No FFR, No IVUS  I (3)  Indication: 21; Score: 3   Patient Information:   1-2V-CAD with DS 50-60% With FFR  A (7)  Indication: 22; Score: 7   Patient Information:   1-2V-CAD with DS 50-60% With FFR>0.8, IVUS not significant  I (2)  Indication: 23; Score: 2   Patient Information:   3V-CAD without LMCA With Abnormal LV systolic function  A (9)  Indication: 48; Score: 9   Patient Information:   LMCA-CAD  A (9)  Indication: 49; Score: 9   Patient  Information:   2V-CAD with prox LAD PCI  A (7)  Indication: 62; Score: 7   Patient Information:   2V-CAD with prox LAD CABG  A (8)  Indication: 62; Score: 8   Patient Information:   3V-CAD without LMCA With Low CAD burden(i.e., 3 focal stenoses, low SYNTAX score) PCI  A (7)  Indication: 63; Score: 7   Patient Information:   3V-CAD without LMCA With Low CAD burden(i.e., 3 focal stenoses, low SYNTAX score) CABG  A (9)  Indication: 63; Score: 9   Patient Information:   3V-CAD without LMCA E06c - Intermediate-high CAD burden (i.e., multiple diffuse lesions, presence of CTO, or high SYNTAX score) PCI  U (4)  Indication: 64; Score: 4   Patient Information:   3V-CAD without LMCA E06c - Intermediate-high CAD burden (i.e., multiple diffuse lesions, presence of CTO, or high SYNTAX score) CABG  A (9)  Indication: 64; Score: 9   Patient Information:   LMCA-CAD With Isolated LMCA stenosis  PCI  U (6)  Indication: 65; Score: 6   Patient Information:   LMCA-CAD With Isolated LMCA stenosis  CABG  A (9)  Indication: 65; Score: 9   Patient Information:   LMCA-CAD Additional CAD, low CAD burden (i.e., 1- to 2-vessel additional involvement, low SYNTAX score) PCI  U (5)  Indication: 66; Score: 5   Patient Information:   LMCA-CAD Additional CAD, low CAD burden (i.e., 1- to 2-vessel additional involvement, low SYNTAX score) CABG  A (9)  Indication: 66; Score: 9   Patient  Information:   LMCA-CAD Additional CAD, intermediate-high CAD burden (i.e., 3-vessel involvement, presence of CTO, or high SYNTAX score) PCI  I (3)  Indication: 67; Score: 3   Patient Information:   LMCA-CAD Additional CAD, intermediate-high CAD burden (i.e., 3-vessel involvement, presence of CTO, or high SYNTAX score) CABG  A (9)  Indication: 67; Score: 9   HARDING, DAVID W, M.D., M.S. Interventional Cardiologist   Pager # [phone]

## 2015-01-10 ENCOUNTER — Encounter (HOSPITAL_COMMUNITY): Payer: Self-pay | Admitting: Cardiology

## 2015-01-16 ENCOUNTER — Other Ambulatory Visit: Payer: Self-pay | Admitting: Cardiovascular Disease

## 2015-02-19 ENCOUNTER — Encounter: Payer: Medicare FFS | Admitting: Cardiovascular Disease

## 2015-02-21 ENCOUNTER — Ambulatory Visit (INDEPENDENT_AMBULATORY_CARE_PROVIDER_SITE_OTHER): Payer: Medicare FFS | Admitting: Cardiovascular Disease

## 2015-02-21 ENCOUNTER — Encounter: Payer: Self-pay | Admitting: Cardiovascular Disease

## 2015-02-21 VITALS — BP 123/77 | HR 73 | Ht 69.0 in | Wt 275.0 lb

## 2015-02-21 DIAGNOSIS — Z23 Encounter for immunization: Secondary | ICD-10-CM

## 2015-02-21 DIAGNOSIS — R0609 Other forms of dyspnea: Secondary | ICD-10-CM | POA: Diagnosis not present

## 2015-02-21 DIAGNOSIS — Z9989 Dependence on other enabling machines and devices: Secondary | ICD-10-CM

## 2015-02-21 DIAGNOSIS — E785 Hyperlipidemia, unspecified: Secondary | ICD-10-CM

## 2015-02-21 DIAGNOSIS — R5383 Other fatigue: Secondary | ICD-10-CM | POA: Diagnosis not present

## 2015-02-21 DIAGNOSIS — Z7189 Other specified counseling: Secondary | ICD-10-CM

## 2015-02-21 DIAGNOSIS — G4733 Obstructive sleep apnea (adult) (pediatric): Secondary | ICD-10-CM

## 2015-02-21 DIAGNOSIS — I10 Essential (primary) hypertension: Secondary | ICD-10-CM

## 2015-02-21 DIAGNOSIS — M549 Dorsalgia, unspecified: Secondary | ICD-10-CM

## 2015-02-21 DIAGNOSIS — E291 Testicular hypofunction: Secondary | ICD-10-CM

## 2015-02-21 DIAGNOSIS — Z955 Presence of coronary angioplasty implant and graft: Secondary | ICD-10-CM

## 2015-02-21 DIAGNOSIS — I252 Old myocardial infarction: Secondary | ICD-10-CM

## 2015-02-21 DIAGNOSIS — R7989 Other specified abnormal findings of blood chemistry: Secondary | ICD-10-CM

## 2015-02-21 DIAGNOSIS — I25118 Atherosclerotic heart disease of native coronary artery with other forms of angina pectoris: Secondary | ICD-10-CM | POA: Diagnosis not present

## 2015-02-21 DIAGNOSIS — Z7182 Exercise counseling: Secondary | ICD-10-CM

## 2015-02-21 MED ORDER — FUROSEMIDE 40 MG PO TABS: 40.0000 mg | ORAL_TABLET | Freq: Every day | ORAL | Status: DC

## 2015-02-21 MED ORDER — METOPROLOL SUCCINATE ER 100 MG PO TB24: ORAL_TABLET | ORAL | Status: DC

## 2015-02-21 NOTE — Progress Notes (Signed)
Patient ID: [name], male   DOB: 07/27/1950, 64 y.o.   MRN: [id number]      SUBJECTIVE: The patient returns after undergoing coronary angiography on 8/30. The recently placed drug-eluting stent was widely patent with moderate ostial stenosis of a jailed D1 of 50%. Left ventriculography demonstrated low normal left ventricle systolic function with distal LAD hypokinesis. There was an elevated LVEDP despite low systolic blood pressure and Dr. Ellyn Hack recommended Lasix 80 mg twice daily for 3 days.  Echocardiogram performed on 8/29 demonstrated normal left ventricular systolic function and regional wall motion, EF 60-65%, mild LVH, normal diastolic function, and moderate left atrial and mild right atrial dilatation.  He continues to feel markedly fatigued. His calcium was mildly low in August at 8.7. 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 Nadara Mustard, stopped replacement therapy last October. He complains of markedly diminished energy levels and muscle mass beginning in May. He quit smoking and alcohol use 12-14 years ago.  He also complains of significant spinal arthritic pain and asks about Motrin use.   Review of Systems: As per "subjective", otherwise negative.  Allergies  Allergen Reactions  . Bee Venom Anaphylaxis  . Duloxetine Swelling    CHF  . Fentanyl Other (See Comments)    Confusion, panic attacks  . Lidocaine Other (See Comments)    REACTION: non-reactive  . Meperidine Hcl Other (See Comments)    REACTION: causes extreme mental reactions.  . Methadone Other (See Comments)    REACTION: causes heart failure  . Oxycodone Hcl Other (See Comments)    REACTION: causes heart failure  . Pregabalin Other (See Comments)    REACTION: chf  . Zolpidem Tartrate Other (See Comments)    REACTION: confusion    Current Outpatient Prescriptions  Medication Sig Dispense Refill  . amitriptyline (ELAVIL) 25 MG tablet Take 1 tablet by mouth  at bedtime as needed.    Marland Kitchen aspirin EC 81 MG tablet Take 81 mg by mouth at bedtime.     Marland Kitchen atorvastatin (LIPITOR) 40 MG tablet TAKE 1 TABLET EVERY DAY 90 tablet 3  . b complex vitamins tablet Take 1 tablet by mouth daily.    . cholecalciferol (VITAMIN D) 1000 UNITS tablet Take 2,000 Units by mouth every morning.     . citalopram (CELEXA) 20 MG tablet Take 1 tablet by mouth daily.    . clonazePAM (KLONOPIN) 0.5 MG tablet Take 0.5 mg by mouth 3 (three) times daily as needed for anxiety.    . clopidogrel (PLAVIX) 75 MG tablet TAKE 1 TABLET DAILY WITH BREAKFAST. 90 tablet 3  . CO ENZYME Q-10 PO Take 1 tablet by mouth every evening.    . diphenhydrAMINE (BENADRYL) 25 MG tablet Take 25-50 mg by mouth 2 (two) times daily as needed for itching.     . fluticasone (CUTIVATE) 0.05 % cream Apply 1 application topically 2 (two) times daily.    . furosemide (LASIX) 40 MG tablet Take 40 mg by mouth 3 (three) times daily. Take 2 tablets in AM and 1 tablet in PM.    . gabapentin (NEURONTIN) 600 MG tablet Take 1,800 mg by mouth 2 (two) times daily.     Marland Kitchen HYDROmorphone (DILAUDID) 4 MG tablet Take 1 tablet by mouth 3 (three) times daily as needed.     . isometheptene-acetaminophen-dichloralphenazone (MIDRIN) 65-325-100 MG capsule Take 3-4 capsules by mouth as directed. Maximum 5 capsules in 12 hours for migraine headaches, 8 capsules in  24 hours for tension headaches.    . isosorbide mononitrate (IMDUR) 60 MG 24 hr tablet Take 1 tablet (60 mg total) by mouth daily. 90 tablet 3  . L-Lysine 500 MG TABS Take 500 mg by mouth 2 (two) times daily.     . Loperamide HCl (IMODIUM A-D PO) Take 3 capsules by mouth daily as needed (chronic diarrhea).    . losartan (COZAAR) 25 MG tablet Take 1 tablet (25 mg total) by mouth daily. 90 tablet 3  . metoprolol succinate (TOPROL-XL) 100 MG 24 hr tablet Take 100 mg by mouth daily. Take with or immediately following a meal.    . Multiple Vitamins-Minerals (MULTIVITAMIN WITH MINERALS)  tablet Take 1 tablet by mouth daily.    . nitroGLYCERIN (NITROSTAT) 0.4 MG SL tablet Place 1 tablet (0.4 mg total) under the tongue every 5 (five) minutes as needed. For chest discomfort 25 tablet 3  . NON FORMULARY CPAP    . omeprazole (PRILOSEC) 40 MG capsule Take 40 mg by mouth daily before breakfast.     . OVER THE COUNTER MEDICATION Take 1 tablet by mouth 2 (two) times daily. Tumeric    . polyethylene glycol (MIRALAX / GLYCOLAX) packet Take 17 g by mouth daily as needed.    . polyvinyl alcohol-povidone (HYPOTEARS) 1.4-0.6 % ophthalmic solution Place 1-2 drops into both eyes daily as needed (dry eyes). For dry eyes    . potassium chloride (K-DUR) 10 MEQ tablet Take 10-20 mEq by mouth 2 (two) times daily. 2 tablets in AM and 1 tablet in PM    . vitamin C (ASCORBIC ACID) 500 MG tablet Take 500 mg by mouth every morning.      No current facility-administered medications for this visit.    Past Medical History  Diagnosis Date  . Coronary artery disease     STENT X 1 2010 Mid LAD  . Neuropathy ([place])   . Fibromyalgia   . Penile lesion   . GERD (gastroesophageal reflux disease)   . History of kidney stones   . Sleep apnea     USES C-PAP  . Anxiety     HX PANIC ATTACKS  . History of vertigo   . Sjogren's disease ([place])   . Narcolepsy   . Headache(784.0)   . Chronic cough   . Dry eyes   . Fluid retention   . Hypertension   . CHF (congestive heart failure) [facility])     Past Surgical History  Procedure Laterality Date  . Meatotomy  X2  . Hernia repair      ING HERNIA REPAIR  . Circumcision    . Vasectomy    . Breast surgery      BIL MASTECTOMY  FOR BENIGN LUMPS  . Carpal tunnell  BIL  . Wrist fracture surgery  L  X3 SURGERIES  . Coronary angioplasty with stent placement  X1 2010  . Left heart catheterization with coronary angiogram N/A 10/11/2013    Procedure: LEFT HEART CATHETERIZATION WITH CORONARY ANGIOGRAM;  Surgeon: Sinclair Grooms, MD;  Location: [facility] CATH LAB;  Service:  Cardiovascular;  Laterality: N/A;  . Left heart catheterization with coronary angiogram N/A 06/21/2014    Procedure: LEFT HEART CATHETERIZATION WITH CORONARY ANGIOGRAM;  Surgeon: Troy Sine, MD;  Location: [facility] CATH LAB;  Service: Cardiovascular;  Laterality: N/A;  . Percutaneous coronary stent intervention (pci-s) N/A 06/23/2014    Procedure: PERCUTANEOUS CORONARY STENT INTERVENTION (PCI-S);  Surgeon: Jettie Booze, MD;  Location: [facility] CATH LAB;  Service: Cardiovascular;  Laterality: N/A;  . Cardiac catheterization N/A 01/09/2015    Procedure: Left Heart Cath and Coronary Angiography;  Surgeon: Leonie Man, MD;  Location: [place] CV LAB;  Service: Cardiovascular;  Laterality: N/A;    Social History   Social History  . Marital Status: Married    Spouse Name: N/A  . Number of Children: N/A  . Years of Education: N/A   Occupational History  . Not on file.   Social History Main Topics  . Smoking status: Former Smoker -- 2.50 packs/day for 32 years    Types: Cigarettes    Start date: 04/17/1967    Quit date: 05/12/2000  . Smokeless tobacco: Never Used  . Alcohol Use: No     Comment: RECOVERING ALCOHOLIC 9 YRS- 7425  . Drug Use: No  . Sexual Activity: Not on file   Other Topics Concern  . Not on file   Social History Narrative     Filed Vitals:   02/21/15 0916  BP: 123/77  Pulse: 73  Height: 5\' 9"  (1.753 m)  Weight: 275 lb (124.739 kg)    PHYSICAL EXAM General: NAD HEENT: Normal. Neck: No JVD, no thyromegaly. Lungs: Clear to auscultation bilaterally with normal respiratory effort. CV: Nondisplaced PMI. Regular rate and rhythm, normal S1/S2, no S3/S4, no murmur. No pretibial or periankle edema.  Abdomen: Soft, nontender, obese, no distention.  Neurologic: Alert and oriented x 3.  Psych: Normal affect. Skin: Normal. Musculoskeletal: No gross deformities. Extremities: No clubbing or cyanosis.   ECG: Most recent ECG reviewed.      ASSESSMENT AND  PLAN: Coronary artery disease s/p LAD DES and PTCA of D1  Cath results noted above. Continue Imdur 60 mg daily, losartan 25 mg daily, ASA, Plavix, and Lipitor 40 mg daily. Given his fatigue, will reduce Toprol-XL to 50 mg daily to see if this helps. Will provide influenza vaccination.  Essential hypertension  His blood pressure is well controlled. He previously developed a dry cough with lisinopril and I switched to losartan 25 mg daily.  Chronic systolic heart failure, EF previously 50%  EF has improved to 60-65% as noted above. Euvolemic. Given normal LVEF and fatigue, will reduce Lasix to 40 mg daily. Should he develop significant fluid retention, can increase to 40 mg bid.  Hyperlipidemia On 02/16/14, TC 127, TG 180, HDL 27, LDL 64. Continue Lipitor 40 mg daily.   Fatigue Quite possibly due to low testosterone levels. We talked about the increased risk of cardiac events with chronic hormonal replacement therapy. However, his quality of life is markedly diminished. I encouraged him to speak with his PCP about low-dose hormone replacement therapy.  Spine arthritic pain I educated him on the deleterious effects of chronic NSAID use including increased risk for cardiac events as well as gastric inflammation and ulceration. I encouraged him to engage in aqua therapy with exercises in the pool at his local [employer]. He is appreciative of this advice and plans on doing so.  Dispo: f/u 6 months.  Kate Sable, M.D., F.A.C.C.

## 2015-02-21 NOTE — Patient Instructions (Signed)
   Decrease Lasix to 40mg  DAILY.  Decrease Toprol XL to 50mg  DAILY.  Patient will contact office when needing refills.  Continue all other medications.   Your physician wants you to follow up in: 6 months.  You will receive a reminder letter in the mail one-two months in advance.  If you don't receive a letter, please call our office to schedule the follow up appointment

## 2015-03-13 ENCOUNTER — Other Ambulatory Visit: Payer: Self-pay

## 2015-03-13 MED ORDER — CLOPIDOGREL BISULFATE 75 MG PO TABS: 75.0000 mg | ORAL_TABLET | Freq: Every day | ORAL | Status: DC

## 2015-04-30 ENCOUNTER — Other Ambulatory Visit: Payer: Self-pay | Admitting: Neurology

## 2015-04-30 DIAGNOSIS — R2681 Unsteadiness on feet: Secondary | ICD-10-CM

## 2015-04-30 DIAGNOSIS — R42 Dizziness and giddiness: Secondary | ICD-10-CM

## 2015-05-17 ENCOUNTER — Ambulatory Visit (HOSPITAL_COMMUNITY): Payer: Medicare FFS

## 2015-05-17 ENCOUNTER — Ambulatory Visit (HOSPITAL_COMMUNITY)
Admission: RE | Admit: 2015-05-17 | Discharge: 2015-05-17 | Disposition: A | Discharge status: Home / Self Care | Payer: Medicare FFS | Source: Ambulatory Visit | Attending: Neurology | Admitting: Neurology

## 2015-05-17 DIAGNOSIS — R42 Dizziness and giddiness: Secondary | ICD-10-CM | POA: Insufficient documentation

## 2015-05-17 DIAGNOSIS — H0520 Unspecified exophthalmos: Secondary | ICD-10-CM | POA: Insufficient documentation

## 2015-05-17 DIAGNOSIS — R2681 Unsteadiness on feet: Secondary | ICD-10-CM | POA: Insufficient documentation

## 2015-05-29 ENCOUNTER — Ambulatory Visit (HOSPITAL_COMMUNITY): Payer: Medicare FFS

## 2015-06-16 ENCOUNTER — Other Ambulatory Visit: Payer: Self-pay | Admitting: Cardiovascular Disease

## 2015-08-22 ENCOUNTER — Ambulatory Visit: Payer: Medicare FFS | Admitting: Cardiovascular Disease

## 2015-08-28 ENCOUNTER — Ambulatory Visit (INDEPENDENT_AMBULATORY_CARE_PROVIDER_SITE_OTHER): Payer: Medicare FFS | Admitting: Cardiovascular Disease

## 2015-08-28 ENCOUNTER — Encounter: Payer: Self-pay | Admitting: Cardiovascular Disease

## 2015-08-28 VITALS — BP 102/70 | HR 70 | Ht 69.0 in | Wt 288.0 lb

## 2015-08-28 DIAGNOSIS — I25118 Atherosclerotic heart disease of native coronary artery with other forms of angina pectoris: Secondary | ICD-10-CM

## 2015-08-28 DIAGNOSIS — E785 Hyperlipidemia, unspecified: Secondary | ICD-10-CM

## 2015-08-28 DIAGNOSIS — I10 Essential (primary) hypertension: Secondary | ICD-10-CM | POA: Diagnosis not present

## 2015-08-28 DIAGNOSIS — Z955 Presence of coronary angioplasty implant and graft: Secondary | ICD-10-CM

## 2015-08-28 DIAGNOSIS — I252 Old myocardial infarction: Secondary | ICD-10-CM

## 2015-08-28 DIAGNOSIS — I5032 Chronic diastolic (congestive) heart failure: Secondary | ICD-10-CM

## 2015-08-28 NOTE — Patient Instructions (Signed)
Continue all current medications. Your physician wants you to follow up in: 6 months.  You will receive a reminder letter in the mail one-two months in advance.  If you don't receive a letter, please call our office to schedule the follow up appointment   

## 2015-08-28 NOTE — Progress Notes (Signed)
Patient ID: [name], male   DOB: October 17, 1950, 65 y.o.   MRN: [id number]      SUBJECTIVE: The patient presents for follow-up of coronary artery disease. He underwent coronary angiography on 01/09/15. Drug-eluting stent was widely patent with moderate ostial stenosis of a jailed D1 of 50%. Left ventriculography demonstrated low normal left ventricle systolic function with distal LAD hypokinesis.   Echocardiogram performed on 01/08/15 demonstrated normal left ventricular systolic function and regional wall motion, EF 60-65%, mild LVH, normal diastolic function, and moderate left atrial and mild right atrial dilatation.  He has only had 3 or 4 anginal episodes in the past 6 months alleviated with one sublingual nitroglycerin tablet. He has been struggling with panic attacks lately, which he deems secondary to fluid accumulation. He takes Lasix 40 mg three times daily and consumes 3-4 L of fluid daily. His wife is a nurse who intermittently administers intramuscular Lasix for increased fluid retention. He has claustrophobia.   Review of Systems: As per "subjective", otherwise negative.  Allergies  Allergen Reactions  . Bee Venom Anaphylaxis  . Duloxetine Swelling    CHF  . Fentanyl Other (See Comments)    Confusion, panic attacks  . Lidocaine Other (See Comments)    REACTION: non-reactive  . Meperidine Hcl Other (See Comments)    REACTION: causes extreme mental reactions.  . Methadone Other (See Comments)    REACTION: causes heart failure  . Oxycodone Hcl Other (See Comments)    REACTION: causes heart failure  . Pregabalin Other (See Comments)    REACTION: chf  . Zolpidem Tartrate Other (See Comments)    REACTION: confusion    Current Outpatient Prescriptions  Medication Sig Dispense Refill  . amitriptyline (ELAVIL) 25 MG tablet Take 1 tablet by mouth at bedtime as needed.    Marland Kitchen aspirin EC 81 MG tablet Take 81 mg by mouth at bedtime.     Marland Kitchen atorvastatin (LIPITOR) 40 MG tablet  TAKE 1 TABLET EVERY DAY 90 tablet 3  . b complex vitamins tablet Take 1 tablet by mouth daily.    . cholecalciferol (VITAMIN D) 1000 UNITS tablet Take 2,000 Units by mouth every morning.     . citalopram (CELEXA) 20 MG tablet Take 1 tablet by mouth daily.    . clonazePAM (KLONOPIN) 0.5 MG tablet Take 0.5 mg by mouth 3 (three) times daily as needed for anxiety.    . clopidogrel (PLAVIX) 75 MG tablet Take 1 tablet (75 mg total) by mouth daily. 90 tablet 1  . CO ENZYME Q-10 PO Take 1 tablet by mouth every evening.    . diphenhydrAMINE (BENADRYL) 25 MG tablet Take 25-50 mg by mouth 2 (two) times daily as needed for itching.     . fluticasone (CUTIVATE) 0.05 % cream Apply 1 application topically 2 (two) times daily.    . furosemide (LASIX) 40 MG tablet Take 120 mg by mouth daily.    Marland Kitchen gabapentin (NEURONTIN) 600 MG tablet Take 1,800 mg by mouth 2 (two) times daily.     Marland Kitchen HYDROmorphone (DILAUDID) 4 MG tablet Take 1 tablet by mouth 3 (three) times daily as needed.     . isometheptene-acetaminophen-dichloralphenazone (MIDRIN) 65-325-100 MG capsule Take 3-4 capsules by mouth as directed. Maximum 5 capsules in 12 hours for migraine headaches, 8 capsules in 24 hours for tension headaches.    . isosorbide mononitrate (IMDUR) 60 MG 24 hr tablet TAKE 1 TABLET EVERY DAY 90 tablet 3  . L-Lysine 500 MG TABS Take 500  mg by mouth 2 (two) times daily.     . Loperamide HCl (IMODIUM A-D PO) Take 3 capsules by mouth daily as needed (chronic diarrhea).    . losartan (COZAAR) 25 MG tablet Take 1 tablet (25 mg total) by mouth daily. 90 tablet 3  . metoprolol succinate (TOPROL-XL) 100 MG 24 hr tablet Take 100 mg by mouth daily. Take with or immediately following a meal.    . Multiple Vitamins-Minerals (MULTIVITAMIN WITH MINERALS) tablet Take 1 tablet by mouth daily.    . nitroGLYCERIN (NITROSTAT) 0.4 MG SL tablet Place 1 tablet (0.4 mg total) under the tongue every 5 (five) minutes as needed. For chest discomfort 25 tablet 3   . NON FORMULARY CPAP    . omeprazole (PRILOSEC) 40 MG capsule Take 40 mg by mouth daily before breakfast.     . OVER THE COUNTER MEDICATION Take 1 tablet by mouth 2 (two) times daily. Tumeric    . polyethylene glycol (MIRALAX / GLYCOLAX) packet Take 17 g by mouth daily as needed.    . polyvinyl alcohol-povidone (HYPOTEARS) 1.4-0.6 % ophthalmic solution Place 1-2 drops into both eyes daily as needed (dry eyes). For dry eyes    . potassium chloride (K-DUR) 10 MEQ tablet Take 10-20 mEq by mouth 2 (two) times daily. 2 tablets in AM and 1 tablet in PM    . vitamin C (ASCORBIC ACID) 500 MG tablet Take 500 mg by mouth every morning.      No current facility-administered medications for this visit.    Past Medical History  Diagnosis Date  . Coronary artery disease     STENT X 1 2010 Mid LAD  . Neuropathy ([place])   . Fibromyalgia   . Penile lesion   . GERD (gastroesophageal reflux disease)   . History of kidney stones   . Sleep apnea     USES C-PAP  . Anxiety     HX PANIC ATTACKS  . History of vertigo   . Sjogren's disease ([place])   . Narcolepsy   . Headache(784.0)   . Chronic cough   . Dry eyes   . Fluid retention   . Hypertension   . CHF (congestive heart failure) [facility])     Past Surgical History  Procedure Laterality Date  . Meatotomy  X2  . Hernia repair      ING HERNIA REPAIR  . Circumcision    . Vasectomy    . Breast surgery      BIL MASTECTOMY  FOR BENIGN LUMPS  . Carpal tunnell  BIL  . Wrist fracture surgery  L  X3 SURGERIES  . Coronary angioplasty with stent placement  X1 2010  . Left heart catheterization with coronary angiogram N/A 10/11/2013    Procedure: LEFT HEART CATHETERIZATION WITH CORONARY ANGIOGRAM;  Surgeon: Sinclair Grooms, MD;  Location: [facility] CATH LAB;  Service: Cardiovascular;  Laterality: N/A;  . Left heart catheterization with coronary angiogram N/A 06/21/2014    Procedure: LEFT HEART CATHETERIZATION WITH CORONARY ANGIOGRAM;  Surgeon: Troy Sine, MD;   Location: [facility] CATH LAB;  Service: Cardiovascular;  Laterality: N/A;  . Percutaneous coronary stent intervention (pci-s) N/A 06/23/2014    Procedure: PERCUTANEOUS CORONARY STENT INTERVENTION (PCI-S);  Surgeon: Jettie Booze, MD;  Location: [facility] CATH LAB;  Service: Cardiovascular;  Laterality: N/A;  . Cardiac catheterization N/A 01/09/2015    Procedure: Left Heart Cath and Coronary Angiography;  Surgeon: Leonie Man, MD;  Location: [place] CV LAB;  Service: Cardiovascular;  Laterality: N/A;    Social History   Social History  . Marital Status: Married    Spouse Name: N/A  . Number of Children: N/A  . Years of Education: N/A   Occupational History  . Not on file.   Social History Main Topics  . Smoking status: Former Smoker -- 2.50 packs/day for 32 years    Types: Cigarettes    Start date: 04/17/1967    Quit date: 05/12/2000  . Smokeless tobacco: Never Used  . Alcohol Use: No     Comment: RECOVERING ALCOHOLIC 9 YRS- 123456  . Drug Use: No  . Sexual Activity: Not on file   Other Topics Concern  . Not on file   Social History Narrative     Filed Vitals:   08/28/15 1511  BP: 102/70  Pulse: 70  Height: 5\' 9"  (1.753 m)  Weight: 288 lb (130.636 kg)  SpO2: 92%    PHYSICAL EXAM General: NAD HEENT: Normal. Neck: No JVD, no thyromegaly. Lungs: Clear to auscultation bilaterally with normal respiratory effort. CV: Nondisplaced PMI. Regular rate and rhythm, normal S1/S2, no S3/S4, no murmur. No pretibial or periankle edema.Venous varicosities b/l. Abdomen: Soft, nontender, obese, no distention.  Neurologic: Alert and oriented x 3.  Psych: Normal affect. Skin: Normal. Musculoskeletal: No gross deformities. Extremities: No clubbing or cyanosis.   ECG: Most recent ECG reviewed.      ASSESSMENT AND PLAN: 1. CAD s/p LAD DES and PTCA of D1: Stable. Continue aspirin, Lipitor, Plavix, nitrates, and beta blockers.  2. Essential HTN: Controlled. No changes.  3.  Hyperlipidemia: Continue statin.  4. Chronic diastolic heart failure: Euvolemic on Lasix 40 mg TID. No changes.  Dispo: fu 6 months.   Kate Sable, M.D., F.A.C.C.

## 2015-09-19 ENCOUNTER — Other Ambulatory Visit: Payer: Self-pay | Admitting: Cardiovascular Disease

## 2015-11-28 ENCOUNTER — Other Ambulatory Visit: Payer: Self-pay | Admitting: Cardiovascular Disease

## 2016-03-31 ENCOUNTER — Emergency Department (HOSPITAL_COMMUNITY): Payer: Medicare FFS

## 2016-03-31 ENCOUNTER — Encounter (HOSPITAL_COMMUNITY): Payer: Self-pay | Admitting: Emergency Medicine

## 2016-03-31 ENCOUNTER — Emergency Department (HOSPITAL_COMMUNITY)
Admission: EM | Admit: 2016-03-31 | Discharge: 2016-03-31 | Disposition: A | Discharge status: Home / Self Care | Payer: Medicare FFS | Attending: Emergency Medicine | Admitting: Emergency Medicine

## 2016-03-31 DIAGNOSIS — G479 Sleep disorder, unspecified: Secondary | ICD-10-CM | POA: Diagnosis not present

## 2016-03-31 DIAGNOSIS — R479 Unspecified speech disturbances: Secondary | ICD-10-CM | POA: Insufficient documentation

## 2016-03-31 DIAGNOSIS — Z791 Long term (current) use of non-steroidal anti-inflammatories (NSAID): Secondary | ICD-10-CM | POA: Insufficient documentation

## 2016-03-31 DIAGNOSIS — I5031 Acute diastolic (congestive) heart failure: Secondary | ICD-10-CM | POA: Insufficient documentation

## 2016-03-31 DIAGNOSIS — I2510 Atherosclerotic heart disease of native coronary artery without angina pectoris: Secondary | ICD-10-CM | POA: Insufficient documentation

## 2016-03-31 DIAGNOSIS — R51 Headache: Secondary | ICD-10-CM | POA: Insufficient documentation

## 2016-03-31 DIAGNOSIS — R519 Headache, unspecified: Secondary | ICD-10-CM

## 2016-03-31 DIAGNOSIS — Z7982 Long term (current) use of aspirin: Secondary | ICD-10-CM | POA: Diagnosis not present

## 2016-03-31 DIAGNOSIS — Z87891 Personal history of nicotine dependence: Secondary | ICD-10-CM | POA: Diagnosis not present

## 2016-03-31 DIAGNOSIS — I110 Hypertensive heart disease with heart failure: Secondary | ICD-10-CM | POA: Diagnosis not present

## 2016-03-31 HISTORY — DX: Herpesviral infection of other male genital organs: A60.02

## 2016-03-31 NOTE — ED Notes (Signed)
ED Provider at bedside. 

## 2016-03-31 NOTE — ED Triage Notes (Signed)
Pt having severe headache x 30 days, per pcp pt needs CT or MRI. headaches in back of head, pt states happens 2 or 3 times a week, will also have difficult speaking with episodes. Denies headache at present

## 2016-03-31 NOTE — ED Provider Notes (Signed)
[facility] DEPT Provider Note   CSN: [id number] Arrival date & time: 03/31/16  1427  By signing my name below, I, [name], attest that this documentation has been prepared under the direction and in the presence of Devin Manifold, MD. Electronically Signed: Rayna [name], ED Scribe. 03/31/16. 5:29 PM.   History   Chief Complaint No chief complaint on file.  HPI HPI Comments: LARY [name] is a 65 y.o. male who presents to the Emergency Department complaining of intermittent, non radiating, right occipital HA x 30 days. He states his episodes typically last between 10-60 minutes and describes them as "being stabbed with a hot knife". His episodes happen 2-4 times per week. He reports associated difficulty speaking which he describes as "hesitant and stuttering but the words are clear in my head" as well as difficulty sleeping when experiencing the pain. He reports a h/o of migraines and denies his current symptoms are consistent with prior migraines. He reports a h/o similar symptoms which affect his extremities noting he will sometimes fall or drop objects. Pt denies any known modifying factors and denies any recent stressors or an increase in stress. Pt does not consume ETOH or do any illegal narcotics. He denies neck pain and acute changes in balance or coordination.   The history is provided by the patient, the spouse and medical records. No language interpreter was used.    Past Medical History:  Diagnosis Date  . Anxiety    HX PANIC ATTACKS  . CHF (congestive heart failure) ([place])   . Chronic cough   . Coronary artery disease    STENT X 1 2010 Mid LAD  . Dry eyes   . Fibromyalgia   . Fluid retention   . GERD (gastroesophageal reflux disease)   . Headache(784.0)   . Herpes genitalis in men   . History of kidney stones   . History of vertigo   . Hypertension   . Narcolepsy   . Neuropathy ([place])   . Penile lesion   . Sjogren's disease ([place])   . Sleep apnea    USES C-PAP    Patient Active Problem List   Diagnosis Date Noted  . DOE (dyspnea on exertion)   . S/P coronary artery stent placement   . Accelerating angina ([place])   . Unstable angina ([place]) 06/21/2014  . Acute diastolic heart failure- EF 50% 10/12/2013  . OSA on CPAP 10/10/2013  . NSTEMI (non-ST elevated myocardial infarction) ([place]) 10/10/2013  . Essential hypertension 10/10/2013  . Dyslipidemia, goal LDL below 70 06/07/2009  . IDIOPATHIC PERIPHERAL AUTONOMIC NEUROPATHY UNSP 06/13/2008  . CAD S/P percutaneous coronary angioplasty - PCI LAD in 2010, & 2016 06/13/2008  . DYSPNEA 06/13/2008    Past Surgical History:  Procedure Laterality Date  . BREAST SURGERY     BIL MASTECTOMY  FOR BENIGN LUMPS  . CARDIAC CATHETERIZATION N/A 01/09/2015   Procedure: Left Heart Cath and Coronary Angiography;  Surgeon: Leonie Man, MD;  Location: [facility];  Service: Cardiovascular;  Laterality: N/A;  . CARPAL TUNNELL  BIL  . CIRCUMCISION    . CORONARY ANGIOPLASTY WITH STENT PLACEMENT  X1 2010  . HERNIA REPAIR     ING HERNIA REPAIR  . LEFT HEART CATHETERIZATION WITH CORONARY ANGIOGRAM N/A 10/11/2013   Procedure: LEFT HEART CATHETERIZATION WITH CORONARY ANGIOGRAM;  Surgeon: Sinclair Grooms, MD;  Location: [facility] CATH LAB;  Service: Cardiovascular;  Laterality: N/A;  . LEFT HEART CATHETERIZATION WITH CORONARY ANGIOGRAM N/A 06/21/2014  Procedure: LEFT HEART CATHETERIZATION WITH CORONARY ANGIOGRAM;  Surgeon: Troy Sine, MD;  Location: [facility] CATH LAB;  Service: Cardiovascular;  Laterality: N/A;  . MEATOTOMY  X2  . PERCUTANEOUS CORONARY STENT INTERVENTION (PCI-S) N/A 06/23/2014   Procedure: PERCUTANEOUS CORONARY STENT INTERVENTION (PCI-S);  Surgeon: Jettie Booze, MD;  Location: [facility] CATH LAB;  Service: Cardiovascular;  Laterality: N/A;  . VASECTOMY    . WRIST FRACTURE SURGERY  L  X3 SURGERIES       Home Medications    Prior to Admission medications   Medication Sig Start Date End  Date Taking? Authorizing Provider  amitriptyline (ELAVIL) 25 MG tablet Take 1 tablet by mouth at bedtime as needed. 01/11/15   Historical Provider, MD  aspirin EC 81 MG tablet Take 81 mg by mouth at bedtime.     Historical Provider, MD  atorvastatin (LIPITOR) 40 MG tablet TAKE 1 TABLET EVERY DAY 11/29/15   Herminio Commons, MD  b complex vitamins tablet Take 1 tablet by mouth daily.    Historical Provider, MD  cholecalciferol (VITAMIN D) 1000 UNITS tablet Take 2,000 Units by mouth every morning.     Historical Provider, MD  citalopram (CELEXA) 20 MG tablet Take 1 tablet by mouth daily. 01/18/15   Historical Provider, MD  clonazePAM (KLONOPIN) 0.5 MG tablet Take 0.5 mg by mouth 3 (three) times daily as needed for anxiety.    Historical Provider, MD  clopidogrel (PLAVIX) 75 MG tablet TAKE 1 TABLET EVERY DAY 09/19/15   Herminio Commons, MD  CO ENZYME Q-10 PO Take 1 tablet by mouth every evening.    Historical Provider, MD  diphenhydrAMINE (BENADRYL) 25 MG tablet Take 25-50 mg by mouth 2 (two) times daily as needed for itching.     Historical Provider, MD  fluticasone (CUTIVATE) 0.05 % cream Apply 1 application topically 2 (two) times daily. 02/14/15   Historical Provider, MD  furosemide (LASIX) 40 MG tablet Take 120 mg by mouth daily.    Historical Provider, MD  gabapentin (NEURONTIN) 600 MG tablet Take 1,800 mg by mouth 2 (two) times daily.     Historical Provider, MD  HYDROmorphone (DILAUDID) 4 MG tablet Take 1 tablet by mouth 3 (three) times daily as needed.  10/14/13   Historical Provider, MD  isometheptene-acetaminophen-dichloralphenazone (MIDRIN) 65-325-100 MG capsule Take 3-4 capsules by mouth as directed. Maximum 5 capsules in 12 hours for migraine headaches, 8 capsules in 24 hours for tension headaches.    Historical Provider, MD  isosorbide mononitrate (IMDUR) 60 MG 24 hr tablet TAKE 1 TABLET EVERY DAY 06/18/15   Herminio Commons, MD  L-Lysine 500 MG TABS Take 500 mg by mouth 2 (two) times  daily.     Historical Provider, MD  Loperamide HCl (IMODIUM A-D PO) Take 3 capsules by mouth daily as needed (chronic diarrhea).    Historical Provider, MD  losartan (COZAAR) 25 MG tablet Take 1 tablet (25 mg total) by mouth daily. 08/22/14   Herminio Commons, MD  metoprolol succinate (TOPROL-XL) 100 MG 24 hr tablet Take 100 mg by mouth daily. Take with or immediately following a meal.    Historical Provider, MD  Multiple Vitamins-Minerals (MULTIVITAMIN WITH MINERALS) tablet Take 1 tablet by mouth daily.    Historical Provider, MD  nitroGLYCERIN (NITROSTAT) 0.4 MG SL tablet Place 1 tablet (0.4 mg total) under the tongue every 5 (five) minutes as needed. For chest discomfort 06/27/14   Herminio Commons, MD  NON FORMULARY CPAP  Historical Provider, MD  omeprazole (PRILOSEC) 40 MG capsule Take 40 mg by mouth daily before breakfast.     Historical Provider, MD  OVER THE COUNTER MEDICATION Take 1 tablet by mouth 2 (two) times daily. Tumeric    Historical Provider, MD  polyethylene glycol (MIRALAX / GLYCOLAX) packet Take 17 g by mouth daily as needed.    Historical Provider, MD  polyvinyl alcohol-povidone (HYPOTEARS) 1.4-0.6 % ophthalmic solution Place 1-2 drops into both eyes daily as needed (dry eyes). For dry eyes    Historical Provider, MD  potassium chloride (K-DUR) 10 MEQ tablet Take 10-20 mEq by mouth 2 (two) times daily. 2 tablets in AM and 1 tablet in PM    Historical Provider, MD  vitamin C (ASCORBIC ACID) 500 MG tablet Take 500 mg by mouth every morning.     Historical Provider, MD    Family History Family History  Problem Relation Age of Onset  . Heart disease      Social History Social History  Substance Use Topics  . Smoking status: Former Smoker    Packs/day: 2.50    Years: 32.00    Types: Cigarettes    Start date: 04/17/1967    Quit date: 05/12/2000  . Smokeless tobacco: Never Used  . Alcohol use No     Comment: RECOVERING ALCOHOLIC 9 YRS- 123456    Allergies   Bee  venom; Duloxetine; Fentanyl; Lidocaine; Meperidine hcl; Methadone; Oxycodone hcl; Pregabalin; and Zolpidem tartrate   Review of Systems Review of Systems  Neurological: Positive for speech difficulty and headaches.  Psychiatric/Behavioral: Positive for sleep disturbance.  All other systems reviewed and are negative.  Physical Exam Updated Vital Signs BP 125/65 (BP Location: Left Arm)   Pulse 60   Temp 98.1 F (36.7 C) (Oral)   Resp 20   Ht 5\' 8"  (1.727 m)   Wt 265 lb (120.2 kg)   SpO2 98%   BMI 40.29 kg/m   Physical Exam  Constitutional: He is oriented to person, place, and time. He appears well-developed and well-nourished.  HENT:  Head: Normocephalic and atraumatic.  Eyes: Pupils are equal, round, and reactive to light.  Cardiovascular: Regular rhythm.   Pulmonary/Chest: Effort normal.  Abdominal: Soft.  Musculoskeletal: Normal range of motion.  Neurological: He is alert and oriented to person, place, and time.  Mild global weakness noted. Speech normal. No facial asymetry.   Nursing note and vitals reviewed.  ED Treatments / Results  Labs (all labs ordered are listed, but only abnormal results are displayed) Labs Reviewed - No data to display  EKG  EKG Interpretation None       Radiology Ct Head Wo Contrast  Result Date: 03/31/2016 CLINICAL DATA:  65 y/o M; severe headaches with expressive aphasia. Knot on the back of head. EXAM: CT HEAD WITHOUT CONTRAST TECHNIQUE: Contiguous axial images were obtained from the base of the skull through the vertex without intravenous contrast. COMPARISON:  05/17/2015 MRI head. FINDINGS: Brain: No evidence of acute infarction, hemorrhage, hydrocephalus, extra-axial collection or mass lesion/mass effect. Mild chronic microvascular ischemic changes and parenchymal volume loss. Vascular: No hyperdense vessel. Mild calcific atherosclerosis of carotid siphons. Skull: Normal. Negative for fracture or focal lesion. Sinuses/Orbits: No  acute finding. Other: None. IMPRESSION: No acute intracranial abnormality identified. Stable mild chronic microvascular ischemic changes and parenchymal volume loss. Electronically Signed   By: Kristine Garbe M.D.   On: 03/31/2016 18:38    Procedures Procedures  DIAGNOSTIC STUDIES: Oxygen Saturation is 98% on RA,  normal by my interpretation.    COORDINATION OF CARE: 5:29 PM Discussed next steps with pt. Pt verbalized understanding and is agreeable with the plan.    Medications Ordered in ED Medications - No data to display  Initial Impression / Assessment and Plan / ED Course  I have reviewed the triage vital signs and the nursing notes.  Pertinent labs & imaging results that were available during my care of the patient were reviewed by me and considered in my medical decision making (see chart for details).  Clinical Course      64yM with headache Suspect primary HA. Consider emergent secondary causes such as bleed, infectious or mass but doubt. There is no history of trauma. Pt has a nonfocal neurological exam. Afebrile and neck supple. No use of blood thinning medication. Consider ocular etiology such as acute angle closure glaucoma but doubt. Pt denies acute change in visual acuity and eye exam unremarkable. Doubt temporal arteritis. no temporal tenderness and temporal artery pulsations palpable. Doubt CO poisoning. No contacts with similar symptoms. Doubt venous thrombosis. Doubt carotid or vertebral arteries dissection. Symptoms improved with meds. Feel that can be safely discharged, but strict return precautions discussed. Outpt fu.  Final Clinical Impressions(s) / ED Diagnoses   Final diagnoses:  Nonintractable headache, unspecified chronicity pattern, unspecified headache type    New Prescriptions New Prescriptions   No medications on file     Devin Manifold, MD 04/10/16 1432

## 2016-04-01 ENCOUNTER — Other Ambulatory Visit: Payer: Self-pay | Admitting: Cardiovascular Disease

## 2016-04-14 ENCOUNTER — Ambulatory Visit (HOSPITAL_COMMUNITY): Payer: Medicare Other

## 2016-04-17 ENCOUNTER — Ambulatory Visit (HOSPITAL_COMMUNITY)
Admission: RE | Admit: 2016-04-17 | Discharge: 2016-04-17 | Disposition: A | Discharge status: Home / Self Care | Payer: Medicare Other | Source: Ambulatory Visit | Attending: Emergency Medicine | Admitting: Emergency Medicine

## 2016-04-17 DIAGNOSIS — R51 Headache: Secondary | ICD-10-CM | POA: Diagnosis not present

## 2016-04-17 DIAGNOSIS — R9089 Other abnormal findings on diagnostic imaging of central nervous system: Secondary | ICD-10-CM | POA: Insufficient documentation

## 2016-04-30 ENCOUNTER — Other Ambulatory Visit: Payer: Self-pay | Admitting: Cardiovascular Disease

## 2016-05-13 DIAGNOSIS — G4710 Hypersomnia, unspecified: Secondary | ICD-10-CM | POA: Diagnosis not present

## 2016-05-13 DIAGNOSIS — M797 Fibromyalgia: Secondary | ICD-10-CM | POA: Diagnosis not present

## 2016-05-13 DIAGNOSIS — G43909 Migraine, unspecified, not intractable, without status migrainosus: Secondary | ICD-10-CM | POA: Diagnosis not present

## 2016-05-13 DIAGNOSIS — M25559 Pain in unspecified hip: Secondary | ICD-10-CM | POA: Diagnosis not present

## 2016-05-13 DIAGNOSIS — M545 Low back pain: Secondary | ICD-10-CM | POA: Diagnosis not present

## 2016-05-13 DIAGNOSIS — G609 Hereditary and idiopathic neuropathy, unspecified: Secondary | ICD-10-CM | POA: Diagnosis not present

## 2016-05-13 DIAGNOSIS — Z79891 Long term (current) use of opiate analgesic: Secondary | ICD-10-CM | POA: Diagnosis not present

## 2016-05-13 DIAGNOSIS — M792 Neuralgia and neuritis, unspecified: Secondary | ICD-10-CM | POA: Diagnosis not present

## 2016-05-13 DIAGNOSIS — A6920 Lyme disease, unspecified: Secondary | ICD-10-CM | POA: Diagnosis not present

## 2016-05-13 DIAGNOSIS — M5416 Radiculopathy, lumbar region: Secondary | ICD-10-CM | POA: Diagnosis not present

## 2016-05-13 DIAGNOSIS — I951 Orthostatic hypotension: Secondary | ICD-10-CM | POA: Diagnosis not present

## 2016-05-13 DIAGNOSIS — G4701 Insomnia due to medical condition: Secondary | ICD-10-CM | POA: Diagnosis not present

## 2016-05-30 DIAGNOSIS — R3915 Urgency of urination: Secondary | ICD-10-CM | POA: Diagnosis not present

## 2016-05-30 DIAGNOSIS — N480 Leukoplakia of penis: Secondary | ICD-10-CM | POA: Diagnosis not present

## 2016-06-06 DIAGNOSIS — M797 Fibromyalgia: Secondary | ICD-10-CM | POA: Diagnosis not present

## 2016-06-06 DIAGNOSIS — G4701 Insomnia due to medical condition: Secondary | ICD-10-CM | POA: Diagnosis not present

## 2016-06-06 DIAGNOSIS — G4710 Hypersomnia, unspecified: Secondary | ICD-10-CM | POA: Diagnosis not present

## 2016-06-06 DIAGNOSIS — M545 Low back pain: Secondary | ICD-10-CM | POA: Diagnosis not present

## 2016-06-06 DIAGNOSIS — M5416 Radiculopathy, lumbar region: Secondary | ICD-10-CM | POA: Diagnosis not present

## 2016-06-06 DIAGNOSIS — G43909 Migraine, unspecified, not intractable, without status migrainosus: Secondary | ICD-10-CM | POA: Diagnosis not present

## 2016-06-06 DIAGNOSIS — I951 Orthostatic hypotension: Secondary | ICD-10-CM | POA: Diagnosis not present

## 2016-06-06 DIAGNOSIS — M25559 Pain in unspecified hip: Secondary | ICD-10-CM | POA: Diagnosis not present

## 2016-06-06 DIAGNOSIS — Z79891 Long term (current) use of opiate analgesic: Secondary | ICD-10-CM | POA: Diagnosis not present

## 2016-06-27 ENCOUNTER — Other Ambulatory Visit: Payer: Self-pay | Admitting: Cardiovascular Disease

## 2016-06-30 ENCOUNTER — Other Ambulatory Visit: Payer: Self-pay | Admitting: Cardiovascular Disease

## 2016-07-10 DIAGNOSIS — J111 Influenza due to unidentified influenza virus with other respiratory manifestations: Secondary | ICD-10-CM | POA: Diagnosis not present

## 2016-07-29 DIAGNOSIS — M5416 Radiculopathy, lumbar region: Secondary | ICD-10-CM | POA: Diagnosis not present

## 2016-07-29 DIAGNOSIS — R2689 Other abnormalities of gait and mobility: Secondary | ICD-10-CM | POA: Diagnosis not present

## 2016-07-29 DIAGNOSIS — G4701 Insomnia due to medical condition: Secondary | ICD-10-CM | POA: Diagnosis not present

## 2016-07-29 DIAGNOSIS — I951 Orthostatic hypotension: Secondary | ICD-10-CM | POA: Diagnosis not present

## 2016-07-29 DIAGNOSIS — M545 Low back pain: Secondary | ICD-10-CM | POA: Diagnosis not present

## 2016-07-29 DIAGNOSIS — Z79891 Long term (current) use of opiate analgesic: Secondary | ICD-10-CM | POA: Diagnosis not present

## 2016-07-29 DIAGNOSIS — M792 Neuralgia and neuritis, unspecified: Secondary | ICD-10-CM | POA: Diagnosis not present

## 2016-07-29 DIAGNOSIS — M25559 Pain in unspecified hip: Secondary | ICD-10-CM | POA: Diagnosis not present

## 2016-07-29 DIAGNOSIS — M797 Fibromyalgia: Secondary | ICD-10-CM | POA: Diagnosis not present

## 2016-07-29 DIAGNOSIS — G4710 Hypersomnia, unspecified: Secondary | ICD-10-CM | POA: Diagnosis not present

## 2016-07-29 DIAGNOSIS — G43909 Migraine, unspecified, not intractable, without status migrainosus: Secondary | ICD-10-CM | POA: Diagnosis not present

## 2016-07-29 DIAGNOSIS — E1143 Type 2 diabetes mellitus with diabetic autonomic (poly)neuropathy: Secondary | ICD-10-CM | POA: Diagnosis not present

## 2016-08-13 DIAGNOSIS — N480 Leukoplakia of penis: Secondary | ICD-10-CM | POA: Diagnosis not present

## 2016-08-13 DIAGNOSIS — N5201 Erectile dysfunction due to arterial insufficiency: Secondary | ICD-10-CM | POA: Diagnosis not present

## 2016-08-14 DIAGNOSIS — I951 Orthostatic hypotension: Secondary | ICD-10-CM | POA: Diagnosis not present

## 2016-08-14 DIAGNOSIS — M5416 Radiculopathy, lumbar region: Secondary | ICD-10-CM | POA: Diagnosis not present

## 2016-08-14 DIAGNOSIS — G4710 Hypersomnia, unspecified: Secondary | ICD-10-CM | POA: Diagnosis not present

## 2016-08-14 DIAGNOSIS — G43909 Migraine, unspecified, not intractable, without status migrainosus: Secondary | ICD-10-CM | POA: Diagnosis not present

## 2016-08-14 DIAGNOSIS — M25559 Pain in unspecified hip: Secondary | ICD-10-CM | POA: Diagnosis not present

## 2016-08-14 DIAGNOSIS — M545 Low back pain: Secondary | ICD-10-CM | POA: Diagnosis not present

## 2016-08-14 DIAGNOSIS — G4701 Insomnia due to medical condition: Secondary | ICD-10-CM | POA: Diagnosis not present

## 2016-08-14 DIAGNOSIS — M797 Fibromyalgia: Secondary | ICD-10-CM | POA: Diagnosis not present

## 2016-08-14 DIAGNOSIS — Z79891 Long term (current) use of opiate analgesic: Secondary | ICD-10-CM | POA: Diagnosis not present

## 2016-08-18 ENCOUNTER — Encounter: Payer: Self-pay | Admitting: Cardiovascular Disease

## 2016-08-18 ENCOUNTER — Ambulatory Visit (INDEPENDENT_AMBULATORY_CARE_PROVIDER_SITE_OTHER): Payer: Medicare Other | Admitting: Cardiovascular Disease

## 2016-08-18 VITALS — BP 124/78 | HR 64 | Ht 68.0 in | Wt 261.0 lb

## 2016-08-18 DIAGNOSIS — I2089 Other forms of angina pectoris: Secondary | ICD-10-CM

## 2016-08-18 DIAGNOSIS — E782 Mixed hyperlipidemia: Secondary | ICD-10-CM | POA: Diagnosis not present

## 2016-08-18 DIAGNOSIS — I5032 Chronic diastolic (congestive) heart failure: Secondary | ICD-10-CM

## 2016-08-18 DIAGNOSIS — I25118 Atherosclerotic heart disease of native coronary artery with other forms of angina pectoris: Secondary | ICD-10-CM

## 2016-08-18 DIAGNOSIS — I208 Other forms of angina pectoris: Secondary | ICD-10-CM | POA: Diagnosis not present

## 2016-08-18 DIAGNOSIS — I252 Old myocardial infarction: Secondary | ICD-10-CM

## 2016-08-18 DIAGNOSIS — I10 Essential (primary) hypertension: Secondary | ICD-10-CM

## 2016-08-18 MED ORDER — ISOSORBIDE MONONITRATE ER 60 MG PO TB24: 90.0000 mg | ORAL_TABLET | Freq: Every day | ORAL | 3 refills | Status: DC

## 2016-08-18 MED ORDER — NITROGLYCERIN 0.4 MG SL SUBL: 0.4000 mg | SUBLINGUAL_TABLET | SUBLINGUAL | 3 refills | Status: DC | PRN

## 2016-08-18 NOTE — Progress Notes (Signed)
SUBJECTIVE: The patient presents for follow-up of coronary artery disease. He is overdue for follow up. He underwent coronary angiography on 01/09/15. Drug-eluting stent was widely patent with moderate ostial stenosis of a jailed D1 of 50%. Left ventriculography demonstrated low normal left ventricular systolic function with distal LAD hypokinesis.   Echocardiogram performed on 01/08/15 demonstrated normal left ventricular systolic function and regional wall motion, EF 60-65%, mild LVH, normal diastolic function, and moderate left atrial and mild right atrial dilatation.  He also has a history of anxiety with panic attacks and claustrophobia.  ECG performed in the office today which I ordered and personally interpreted demonstrates normal sinus rhythm with no ischemic ST segment or T-wave abnormalities, nor any arrhythmias.  For the past 3 weeks, he has been experiencing sharp precordial chest pains 2-3 days out of the week. It radiated to the left arm on one occasion. On one occasion he took up to 3 nitroglycerin tablets before experiencing relief.  He got over the flu roughly 3 weeks ago.  He just began exercising last weekend.  Symptoms are reminiscent of prior anginal pains.   Review of Systems: As per "subjective", otherwise negative.  Allergies  Allergen Reactions  . Bee Venom Anaphylaxis  . Duloxetine Swelling    CHF  . Fentanyl Other (See Comments)    Confusion, panic attacks  . Lidocaine Other (See Comments)    REACTION: non-reactive  . Meperidine Hcl Other (See Comments)    REACTION: causes extreme mental reactions.  . Methadone Other (See Comments)    REACTION: causes heart failure  . Oxycodone Hcl Other (See Comments)    REACTION: causes heart failure  . Pregabalin Other (See Comments)    REACTION: chf  . Zolpidem Tartrate Other (See Comments)    REACTION: confusion    Current Outpatient Prescriptions  Medication Sig Dispense Refill  . aspirin EC 81 MG  tablet Take 81 mg by mouth at bedtime.     Marland Kitchen atorvastatin (LIPITOR) 40 MG tablet TAKE 1 TABLET EVERY DAY 90 tablet 3  . b complex vitamins tablet Take 1 tablet by mouth daily.    . cholecalciferol (VITAMIN D) 1000 UNITS tablet Take 2,000 Units by mouth every morning.     . citalopram (CELEXA) 20 MG tablet Take 1 tablet by mouth daily.    . clonazePAM (KLONOPIN) 0.5 MG tablet Take 0.5 mg by mouth 3 (three) times daily as needed for anxiety.    . clopidogrel (PLAVIX) 75 MG tablet TAKE 1 TABLET DAILY WITH BREAKFAST. 90 tablet 1  . CO ENZYME Q-10 PO Take 1 tablet by mouth every other day.     . diphenhydrAMINE (BENADRYL) 25 MG tablet Take 25-50 mg by mouth 2 (two) times daily as needed for itching.     . fluticasone (CUTIVATE) 0.05 % cream Apply 1 application topically 2 (two) times daily. Applied to face and/or penis area(s)    . furosemide (LASIX) 40 MG tablet Take 120 mg by mouth daily.    Marland Kitchen gabapentin (NEURONTIN) 600 MG tablet Take 1,800 mg by mouth 2 (two) times daily.     Marland Kitchen guaifenesin (HUMIBID E) 400 MG TABS tablet Take 400 mg by mouth daily as needed (for post nasal drip/cough).    Marland Kitchen HYDROmorphone (DILAUDID) 4 MG tablet Take 1 tablet by mouth 4 (four) times daily.     Marland Kitchen ibuprofen (ADVIL,MOTRIN) 200 MG tablet Take 200-600 mg by mouth daily as needed for mild pain or moderate pain.    Marland Kitchen  isometheptene-acetaminophen-dichloralphenazone (MIDRIN) 65-325-100 MG capsule Take 3-4 capsules by mouth as directed. Maximum 5 capsules in 12 hours for migraine headaches, 8 capsules in 24 hours for tension headaches.    . isosorbide mononitrate (IMDUR) 60 MG 24 hr tablet TAKE 1 TABLET EVERY DAY 90 tablet 0  . L-Lysine 500 MG TABS Take 500 mg by mouth 2 (two) times daily.     . Loperamide HCl (IMODIUM A-D PO) Take 3 capsules by mouth daily as needed (chronic diarrhea).    . losartan (COZAAR) 25 MG tablet Take 1 tablet (25 mg total) by mouth daily. 90 tablet 3  . metoprolol succinate (TOPROL-XL) 100 MG 24 hr  tablet Take 100 mg by mouth daily. Take with or immediately following a meal.    . Multiple Vitamins-Minerals (MULTIVITAMIN WITH MINERALS) tablet Take 1 tablet by mouth every other day.     . nitroGLYCERIN (NITROSTAT) 0.4 MG SL tablet Place 1 tablet (0.4 mg total) under the tongue every 5 (five) minutes as needed. For chest discomfort 25 tablet 3  . NON FORMULARY at bedtime. CPAP     . omeprazole (PRILOSEC) 40 MG capsule Take 40 mg by mouth daily before breakfast.     . polyvinyl alcohol-povidone (HYPOTEARS) 1.4-0.6 % ophthalmic solution Place 1-2 drops into both eyes daily as needed (dry eyes). For dry eyes    . potassium chloride (K-DUR) 10 MEQ tablet Take 10-20 mEq by mouth 2 (two) times daily. 2 tablets in AM and 1 tablet in PM    . testosterone cypionate (DEPOTESTOSTERONE CYPIONATE) 200 MG/ML injection Inject 50 mg into the muscle every Friday.    . TURMERIC PO Take 1 capsule by mouth 2 (two) times daily.    . valACYclovir (VALTREX) 500 MG tablet Take 500 mg by mouth 2 (two) times daily.    . vitamin C (ASCORBIC ACID) 500 MG tablet Take 500 mg by mouth every morning.      No current facility-administered medications for this visit.     Past Medical History:  Diagnosis Date  . Anxiety    HX PANIC ATTACKS  . CHF (congestive heart failure) ([place])   . Chronic cough   . Coronary artery disease    STENT X 1 2010 Mid LAD  . Dry eyes   . Fibromyalgia   . Fluid retention   . GERD (gastroesophageal reflux disease)   . Headache(784.0)   . Herpes genitalis in men   . History of kidney stones   . History of vertigo   . Hypertension   . Narcolepsy   . Neuropathy ([place])   . Penile lesion   . Sjogren's disease ([place])   . Sleep apnea    USES C-PAP    Past Surgical History:  Procedure Laterality Date  . BREAST SURGERY     BIL MASTECTOMY  FOR BENIGN LUMPS  . CARDIAC CATHETERIZATION N/A 01/09/2015   Procedure: Left Heart Cath and Coronary Angiography;  Surgeon: Leonie Man, MD;   Location: [facility] LAB;  Service: Cardiovascular;  Laterality: N/A;  . CARPAL TUNNELL  BIL  . CIRCUMCISION    . CORONARY ANGIOPLASTY WITH STENT PLACEMENT  X1 2010  . HERNIA REPAIR     ING HERNIA REPAIR  . LEFT HEART CATHETERIZATION WITH CORONARY ANGIOGRAM N/A 10/11/2013   Procedure: LEFT HEART CATHETERIZATION WITH CORONARY ANGIOGRAM;  Surgeon: Sinclair Grooms, MD;  Location: [facility] CATH LAB;  Service: Cardiovascular;  Laterality: N/A;  . LEFT HEART CATHETERIZATION WITH CORONARY ANGIOGRAM N/A  06/21/2014   Procedure: LEFT HEART CATHETERIZATION WITH CORONARY ANGIOGRAM;  Surgeon: Troy Sine, MD;  Location: [facility] CATH LAB;  Service: Cardiovascular;  Laterality: N/A;  . MEATOTOMY  X2  . PERCUTANEOUS CORONARY STENT INTERVENTION (PCI-S) N/A 06/23/2014   Procedure: PERCUTANEOUS CORONARY STENT INTERVENTION (PCI-S);  Surgeon: Jettie Booze, MD;  Location: [facility] CATH LAB;  Service: Cardiovascular;  Laterality: N/A;  . VASECTOMY    . WRIST FRACTURE SURGERY  L  X3 SURGERIES    Social History   Social History  . Marital status: Married    Spouse name: N/A  . Number of children: N/A  . Years of education: N/A   Occupational History  . Not on file.   Social History Main Topics  . Smoking status: Former Smoker    Packs/day: 2.50    Years: 32.00    Types: Cigarettes    Start date: 04/17/1967    Quit date: 05/12/2000  . Smokeless tobacco: Never Used  . Alcohol use No     Comment: RECOVERING ALCOHOLIC 9 YRS- 3976  . Drug use: No  . Sexual activity: Not on file   Other Topics Concern  . Not on file   Social History Narrative  . No narrative on file     Vitals:   08/18/16 1354  BP: 124/78  Pulse: 64  SpO2: 95%  Weight: 261 lb (118.4 kg)  Height: 5\' 8"  (1.727 m)    Wt Readings from Last 3 Encounters:  08/18/16 261 lb (118.4 kg)  03/31/16 265 lb (120.2 kg)  08/28/15 288 lb (130.6 kg)     PHYSICAL EXAM General: NAD HEENT: Normal. Neck: No JVD, no thyromegaly. Lungs: Clear  to auscultation bilaterally with normal respiratory effort. CV: Nondisplaced PMI.  Regular rate and rhythm, normal S1/S2, no S3/S4, no murmur. No pretibial or periankle edema.  No carotid bruit. Venous varicosities. bilaterally. Abdomen: Soft, nontender, obese.  Neurologic: Alert and oriented.  Psych: Normal affect. Skin: Normal. Musculoskeletal: No gross deformities.    ECG: Most recent ECG reviewed.   Labs: Lab Results  Component Value Date/Time   K 4.1 01/09/2015 01:44 PM   BUN 8 01/09/2015 01:44 PM   CREATININE 0.92 01/09/2015 01:44 PM   ALT 21 07/30/2011 09:18 PM   HGB 12.9 (L) 01/09/2015 12:02 PM     Lipids: Lab Results  Component Value Date/Time   LDLCALC 100 (H) 10/11/2013 03:57 AM   CHOL 168 10/11/2013 03:57 AM   TRIG 172 (H) 10/11/2013 03:57 AM   HDL 34 (L) 10/11/2013 03:57 AM       ASSESSMENT AND PLAN: 1. Angina in context of CAD s/p LAD DES and PTCA of D1: Currently taking aspirin, Lipitor, Plavix, long-acting nitrates, and beta blockers. I will increase Imdur to 90 mg daily. I will also refill sublingual nitroglycerin tablets. I will obtain a Lexiscan Myoview stress test.  2. Essential HTN: Controlled on present therapy. No changes.  3. Hyperlipidemia: Continue Lipitor. I will obtain a copy of his lipids for personal review.  4. Chronic diastolic heart failure: Compensated on Lasix 40 mg 3 times daily. No changes.   Disposition: Follow up 1 month.   Kate Sable, M.D., F.A.C.C.

## 2016-08-18 NOTE — Patient Instructions (Signed)
Your physician recommends that you schedule a follow-up appointment in:  1 month with Dr Bronson Ing    INCREASE Imdur to 90 mg daily    Your physician has requested that you have a lexiscan myoview. For further information please visit [email or web address]. Please follow instruction sheet, as given.      Thank you for choosing [facility] !

## 2016-08-28 ENCOUNTER — Encounter (HOSPITAL_BASED_OUTPATIENT_CLINIC_OR_DEPARTMENT_OTHER)
Admission: RE | Admit: 2016-08-28 | Discharge: 2016-08-28 | Disposition: A | Discharge status: Home / Self Care | Payer: Medicare Other | Source: Ambulatory Visit | Attending: Cardiovascular Disease | Admitting: Cardiovascular Disease

## 2016-08-28 ENCOUNTER — Encounter (HOSPITAL_COMMUNITY): Payer: Self-pay

## 2016-08-28 ENCOUNTER — Encounter (HOSPITAL_COMMUNITY)
Admission: RE | Admit: 2016-08-28 | Discharge: 2016-08-28 | Disposition: A | Discharge status: Home / Self Care | Payer: Medicare Other | Source: Ambulatory Visit | Attending: Cardiovascular Disease | Admitting: Cardiovascular Disease

## 2016-08-28 DIAGNOSIS — I208 Other forms of angina pectoris: Secondary | ICD-10-CM

## 2016-08-28 LAB — NM MYOCAR MULTI W/SPECT W/WALL MOTION / EF
CHL CUP NUCLEAR SDS: 2
CHL CUP RESTING HR STRESS: 57 {beats}/min
CSEPPHR: 74 {beats}/min
LV dias vol: 134 mL (ref 62–150)
LVSYSVOL: 53 mL
NUC STRESS TID: 0.88
RATE: 0.35
SRS: 2
SSS: 4

## 2016-08-28 MED ORDER — TECHNETIUM TC 99M TETROFOSMIN IV KIT
10.0000 | PACK | Freq: Once | INTRAVENOUS | Status: AC | PRN
  Administered 2016-08-28: 11.2 via INTRAVENOUS

## 2016-08-28 MED ORDER — TECHNETIUM TC 99M TETROFOSMIN IV KIT
30.0000 | PACK | Freq: Once | INTRAVENOUS | Status: AC | PRN
  Administered 2016-08-28: 33.1 via INTRAVENOUS

## 2016-08-28 MED ORDER — REGADENOSON 0.4 MG/5ML IV SOLN
INTRAVENOUS | Status: AC
  Administered 2016-08-28: 0.4 mg via INTRAVENOUS
  Filled 2016-08-28: qty 5

## 2016-08-28 MED ORDER — SODIUM CHLORIDE 0.9% FLUSH
INTRAVENOUS | Status: AC
  Administered 2016-08-28: 10 mL via INTRAVENOUS
  Filled 2016-08-28: qty 10

## 2016-09-18 DIAGNOSIS — E291 Testicular hypofunction: Secondary | ICD-10-CM | POA: Diagnosis not present

## 2016-09-18 DIAGNOSIS — Z0001 Encounter for general adult medical examination with abnormal findings: Secondary | ICD-10-CM | POA: Diagnosis not present

## 2016-09-18 DIAGNOSIS — I10 Essential (primary) hypertension: Secondary | ICD-10-CM | POA: Diagnosis not present

## 2016-09-18 DIAGNOSIS — R947 Abnormal results of other endocrine function studies: Secondary | ICD-10-CM | POA: Diagnosis not present

## 2016-09-18 DIAGNOSIS — D519 Vitamin B12 deficiency anemia, unspecified: Secondary | ICD-10-CM | POA: Diagnosis not present

## 2016-09-18 DIAGNOSIS — D509 Iron deficiency anemia, unspecified: Secondary | ICD-10-CM | POA: Diagnosis not present

## 2016-09-22 DIAGNOSIS — Z6838 Body mass index (BMI) 38.0-38.9, adult: Secondary | ICD-10-CM | POA: Diagnosis not present

## 2016-09-22 DIAGNOSIS — I10 Essential (primary) hypertension: Secondary | ICD-10-CM | POA: Diagnosis not present

## 2016-09-22 DIAGNOSIS — N481 Balanitis: Secondary | ICD-10-CM | POA: Diagnosis not present

## 2016-09-22 DIAGNOSIS — M797 Fibromyalgia: Secondary | ICD-10-CM | POA: Diagnosis not present

## 2016-09-22 DIAGNOSIS — F419 Anxiety disorder, unspecified: Secondary | ICD-10-CM | POA: Diagnosis not present

## 2016-09-22 DIAGNOSIS — I2510 Atherosclerotic heart disease of native coronary artery without angina pectoris: Secondary | ICD-10-CM | POA: Diagnosis not present

## 2016-09-24 ENCOUNTER — Ambulatory Visit (INDEPENDENT_AMBULATORY_CARE_PROVIDER_SITE_OTHER): Payer: Medicare Other | Admitting: Cardiovascular Disease

## 2016-09-24 ENCOUNTER — Encounter: Payer: Self-pay | Admitting: Cardiovascular Disease

## 2016-09-24 VITALS — BP 130/74 | HR 61 | Ht 68.5 in | Wt 263.0 lb

## 2016-09-24 DIAGNOSIS — E782 Mixed hyperlipidemia: Secondary | ICD-10-CM

## 2016-09-24 DIAGNOSIS — Z955 Presence of coronary angioplasty implant and graft: Secondary | ICD-10-CM | POA: Diagnosis not present

## 2016-09-24 DIAGNOSIS — I10 Essential (primary) hypertension: Secondary | ICD-10-CM

## 2016-09-24 DIAGNOSIS — I208 Other forms of angina pectoris: Secondary | ICD-10-CM | POA: Diagnosis not present

## 2016-09-24 DIAGNOSIS — I252 Old myocardial infarction: Secondary | ICD-10-CM | POA: Diagnosis not present

## 2016-09-24 DIAGNOSIS — R5383 Other fatigue: Secondary | ICD-10-CM

## 2016-09-24 DIAGNOSIS — I25118 Atherosclerotic heart disease of native coronary artery with other forms of angina pectoris: Secondary | ICD-10-CM | POA: Diagnosis not present

## 2016-09-24 DIAGNOSIS — I5032 Chronic diastolic (congestive) heart failure: Secondary | ICD-10-CM | POA: Diagnosis not present

## 2016-09-24 DIAGNOSIS — I2089 Other forms of angina pectoris: Secondary | ICD-10-CM

## 2016-09-24 NOTE — Progress Notes (Signed)
SUBJECTIVE: The patient presents for follow up after undergoing cardiovascular testing for angina. I increased Imdur to 90 mg at his last visit.  Nuclear stress test showed no evidence of ischemia, LVEF 55-65%.  He underwent coronary angiography on 01/09/15. Drug-eluting stent was widely patent with moderate ostial stenosis of a jailed D1 of 50%.  He also has a history of anxiety with panic attacks and claustrophobia, along with chronic pain for which he takes Dilaudid and Motrin.  He has having less frequent and less intense episodes of chest pain since the increase of Imdur. He is biking about 15-20 minutes daily and also playing with his new puppy. He is less fatigued as well.  Review of Systems: As per "subjective", otherwise negative.  Allergies  Allergen Reactions  . Bee Venom Anaphylaxis  . Duloxetine Swelling    CHF  . Fentanyl Other (See Comments)    Confusion, panic attacks  . Lidocaine Other (See Comments)    REACTION: non-reactive  . Meperidine Hcl Other (See Comments)    REACTION: causes extreme mental reactions.  . Methadone Other (See Comments)    REACTION: causes heart failure  . Oxycodone Hcl Other (See Comments)    REACTION: causes heart failure  . Pregabalin Other (See Comments)    REACTION: chf  . Zolpidem Tartrate Other (See Comments)    REACTION: confusion    Current Outpatient Prescriptions  Medication Sig Dispense Refill  . aspirin EC 81 MG tablet Take 81 mg by mouth at bedtime.     Marland Kitchen atorvastatin (LIPITOR) 40 MG tablet TAKE 1 TABLET EVERY DAY 90 tablet 3  . b complex vitamins tablet Take 1 tablet by mouth daily.    . cholecalciferol (VITAMIN D) 1000 UNITS tablet Take 2,000 Units by mouth every morning.     . citalopram (CELEXA) 20 MG tablet Take 1 tablet by mouth daily.    . clonazePAM (KLONOPIN) 0.5 MG tablet Take 0.5 mg by mouth 3 (three) times daily as needed for anxiety.    . clopidogrel (PLAVIX) 75 MG tablet TAKE 1 TABLET DAILY WITH  BREAKFAST. 90 tablet 1  . CO ENZYME Q-10 PO Take 1 tablet by mouth every other day.     . diphenhydrAMINE (BENADRYL) 25 MG tablet Take 25-50 mg by mouth 2 (two) times daily as needed for itching.     . fluticasone (CUTIVATE) 0.05 % cream Apply 1 application topically 2 (two) times daily. Applied to face and/or penis area(s)    . furosemide (LASIX) 40 MG tablet Take 120 mg by mouth daily.    Marland Kitchen gabapentin (NEURONTIN) 600 MG tablet Take 1,800 mg by mouth 2 (two) times daily.     Marland Kitchen guaifenesin (HUMIBID E) 400 MG TABS tablet Take 400 mg by mouth daily as needed (for post nasal drip/cough).    Marland Kitchen HYDROmorphone (DILAUDID) 4 MG tablet Take 1 tablet by mouth 4 (four) times daily.     Marland Kitchen ibuprofen (ADVIL,MOTRIN) 200 MG tablet Take 200-600 mg by mouth daily as needed for mild pain or moderate pain.    Marland Kitchen isometheptene-acetaminophen-dichloralphenazone (MIDRIN) 65-325-100 MG capsule Take 3-4 capsules by mouth as directed. Maximum 5 capsules in 12 hours for migraine headaches, 8 capsules in 24 hours for tension headaches.    . isosorbide mononitrate (IMDUR) 60 MG 24 hr tablet Take 1.5 tablets (90 mg total) by mouth daily. 135 tablet 3  . L-Lysine 500 MG TABS Take 500 mg by mouth 2 (two) times daily.     Marland Kitchen  Loperamide HCl (IMODIUM A-D PO) Take 3 capsules by mouth daily as needed (chronic diarrhea).    . losartan (COZAAR) 25 MG tablet Take 1 tablet (25 mg total) by mouth daily. 90 tablet 3  . metoprolol succinate (TOPROL-XL) 100 MG 24 hr tablet Take 100 mg by mouth daily. Take with or immediately following a meal.    . Multiple Vitamins-Minerals (MULTIVITAMIN WITH MINERALS) tablet Take 1 tablet by mouth every other day.     . nitroGLYCERIN (NITROSTAT) 0.4 MG SL tablet Place 1 tablet (0.4 mg total) under the tongue every 5 (five) minutes as needed. For chest discomfort 90 tablet 3  . NON FORMULARY at bedtime. CPAP     . omeprazole (PRILOSEC) 40 MG capsule Take 40 mg by mouth daily before breakfast.     . polyvinyl  alcohol-povidone (HYPOTEARS) 1.4-0.6 % ophthalmic solution Place 1-2 drops into both eyes daily as needed (dry eyes). For dry eyes    . potassium chloride (K-DUR) 10 MEQ tablet Take 10-20 mEq by mouth 2 (two) times daily. 2 tablets in AM and 1 tablet in PM    . testosterone cypionate (DEPOTESTOSTERONE CYPIONATE) 200 MG/ML injection Inject 50 mg into the muscle every Friday.    . TURMERIC PO Take 1 capsule by mouth 2 (two) times daily.    . valACYclovir (VALTREX) 500 MG tablet Take 500 mg by mouth 2 (two) times daily.    . vitamin C (ASCORBIC ACID) 500 MG tablet Take 500 mg by mouth every morning.      No current facility-administered medications for this visit.     Past Medical History:  Diagnosis Date  . Anxiety    HX PANIC ATTACKS  . CHF (congestive heart failure) ([place])   . Chronic cough   . Coronary artery disease    STENT X 1 2010 Mid LAD  . Dry eyes   . Fibromyalgia   . Fluid retention   . GERD (gastroesophageal reflux disease)   . Headache(784.0)   . Herpes genitalis in men   . History of kidney stones   . History of vertigo   . Hypertension   . Narcolepsy   . Neuropathy   . Penile lesion   . Sjogren's disease ([place])   . Sleep apnea    USES C-PAP    Past Surgical History:  Procedure Laterality Date  . BREAST SURGERY     BIL MASTECTOMY  FOR BENIGN LUMPS  . CARDIAC CATHETERIZATION N/A 01/09/2015   Procedure: Left Heart Cath and Coronary Angiography;  Surgeon: Leonie Man, MD;  Location: [facility];  Service: Cardiovascular;  Laterality: N/A;  . CARPAL TUNNELL  BIL  . CIRCUMCISION    . CORONARY ANGIOPLASTY WITH STENT PLACEMENT  X1 2010  . HERNIA REPAIR     ING HERNIA REPAIR  . LEFT HEART CATHETERIZATION WITH CORONARY ANGIOGRAM N/A 10/11/2013   Procedure: LEFT HEART CATHETERIZATION WITH CORONARY ANGIOGRAM;  Surgeon: Sinclair Grooms, MD;  Location: [facility] CATH LAB;  Service: Cardiovascular;  Laterality: N/A;  . LEFT HEART CATHETERIZATION WITH CORONARY ANGIOGRAM  N/A 06/21/2014   Procedure: LEFT HEART CATHETERIZATION WITH CORONARY ANGIOGRAM;  Surgeon: Troy Sine, MD;  Location: [facility] CATH LAB;  Service: Cardiovascular;  Laterality: N/A;  . MEATOTOMY  X2  . PERCUTANEOUS CORONARY STENT INTERVENTION (PCI-S) N/A 06/23/2014   Procedure: PERCUTANEOUS CORONARY STENT INTERVENTION (PCI-S);  Surgeon: Jettie Booze, MD;  Location: [facility] CATH LAB;  Service: Cardiovascular;  Laterality: N/A;  . VASECTOMY    .  WRIST FRACTURE SURGERY  L  X3 SURGERIES    Social History   Social History  . Marital status: Married    Spouse name: N/A  . Number of children: N/A  . Years of education: N/A   Occupational History  . Not on file.   Social History Main Topics  . Smoking status: Former Smoker    Packs/day: 2.50    Years: 32.00    Types: Cigarettes    Start date: 04/17/1967    Quit date: 05/12/2000  . Smokeless tobacco: Never Used  . Alcohol use No     Comment: RECOVERING ALCOHOLIC 9 YRS- 9417  . Drug use: No  . Sexual activity: Not on file   Other Topics Concern  . Not on file   Social History Narrative  . No narrative on file     Vitals:   09/24/16 1559  BP: 130/74  Pulse: 61  SpO2: 97%  Weight: 263 lb (119.3 kg)  Height: 5' 8.5" (1.74 m)    Wt Readings from Last 3 Encounters:  09/24/16 263 lb (119.3 kg)  08/18/16 261 lb (118.4 kg)  03/31/16 265 lb (120.2 kg)     PHYSICAL EXAM General: NAD HEENT: Normal. Neck: No JVD, no thyromegaly. Lungs: Clear to auscultation bilaterally with normal respiratory effort. CV: Nondisplaced PMI.  Regular rate and rhythm, normal S1/S2, no S3/S4, no murmur. No pretibial or periankle edema.  Venous varicosities. bilaterally. Abdomen: Soft, nontender, obese, no distention.  Neurologic: Alert and oriented.  Psych: Normal affect. Skin: Normal. Musculoskeletal: No gross deformities.    ECG: Most recent ECG reviewed.   Labs: Lab Results  Component Value Date/Time   K 4.1 01/09/2015 01:44 PM   BUN 8  01/09/2015 01:44 PM   CREATININE 0.92 01/09/2015 01:44 PM   ALT 21 07/30/2011 09:18 PM   HGB 12.9 (L) 01/09/2015 12:02 PM     Lipids: Lab Results  Component Value Date/Time   LDLCALC 100 (H) 10/11/2013 03:57 AM   CHOL 168 10/11/2013 03:57 AM   TRIG 172 (H) 10/11/2013 03:57 AM   HDL 34 (L) 10/11/2013 03:57 AM       ASSESSMENT AND PLAN: 1. Angina in context of CAD s/p LAD DES and PTCA of D1: Symptomatic improvement with increase of Imdur to 90 mg. No ischemia by stress testing as detailed above. Continue ASA, Lipitor, Plavix, and beta blockers.   2. Essential HTN: Controlled on present therapy. No changes.  3. Hyperlipidemia: Continue Lipitor. Lipids 09/18/16-TC 135, TG 178, HDL 35, LDL 64.  4. Chronic diastolic heart failure: Compensated on Lasix 40 mg 3 times daily. No changes.    Disposition: Follow up 1 yr  Kate Sable, M.D., F.A.C.C.

## 2016-09-24 NOTE — Patient Instructions (Signed)
Your physician wants you to follow-up in: 1 Year with Dr. Koneswaran.  You will receive a reminder letter in the mail two months in advance. If you don't receive a letter, please call our office to schedule the follow-up appointment.  Your physician recommends that you continue on your current medications as directed. Please refer to the Current Medication list given to you today.  If you need a refill on your cardiac medications before your next appointment, please call your pharmacy.  Thank you for choosing [facility]!   

## 2016-10-02 DIAGNOSIS — G629 Polyneuropathy, unspecified: Secondary | ICD-10-CM | POA: Diagnosis not present

## 2016-10-02 DIAGNOSIS — M797 Fibromyalgia: Secondary | ICD-10-CM | POA: Diagnosis not present

## 2016-10-02 DIAGNOSIS — N99111 Postprocedural bulbous urethral stricture: Secondary | ICD-10-CM | POA: Diagnosis not present

## 2016-10-02 DIAGNOSIS — Z7982 Long term (current) use of aspirin: Secondary | ICD-10-CM | POA: Diagnosis not present

## 2016-10-02 DIAGNOSIS — N4889 Other specified disorders of penis: Secondary | ICD-10-CM | POA: Diagnosis not present

## 2016-10-02 DIAGNOSIS — Z79899 Other long term (current) drug therapy: Secondary | ICD-10-CM | POA: Diagnosis not present

## 2016-10-02 DIAGNOSIS — Z888 Allergy status to other drugs, medicaments and biological substances status: Secondary | ICD-10-CM | POA: Diagnosis not present

## 2016-10-02 DIAGNOSIS — I10 Essential (primary) hypertension: Secondary | ICD-10-CM | POA: Diagnosis not present

## 2016-10-02 DIAGNOSIS — Z87891 Personal history of nicotine dependence: Secondary | ICD-10-CM | POA: Diagnosis not present

## 2016-10-02 DIAGNOSIS — N471 Phimosis: Secondary | ICD-10-CM | POA: Diagnosis not present

## 2016-10-02 DIAGNOSIS — N480 Leukoplakia of penis: Secondary | ICD-10-CM | POA: Diagnosis not present

## 2016-10-02 DIAGNOSIS — K219 Gastro-esophageal reflux disease without esophagitis: Secondary | ICD-10-CM | POA: Diagnosis not present

## 2016-10-02 DIAGNOSIS — Z791 Long term (current) use of non-steroidal anti-inflammatories (NSAID): Secondary | ICD-10-CM | POA: Diagnosis not present

## 2016-10-02 DIAGNOSIS — Z885 Allergy status to narcotic agent status: Secondary | ICD-10-CM | POA: Diagnosis not present

## 2016-10-02 DIAGNOSIS — Z7902 Long term (current) use of antithrombotics/antiplatelets: Secondary | ICD-10-CM | POA: Diagnosis not present

## 2016-10-02 DIAGNOSIS — G8929 Other chronic pain: Secondary | ICD-10-CM | POA: Diagnosis not present

## 2016-10-20 DIAGNOSIS — L900 Lichen sclerosus et atrophicus: Secondary | ICD-10-CM | POA: Diagnosis not present

## 2016-10-20 DIAGNOSIS — N35014 Post-traumatic urethral stricture, male, unspecified: Secondary | ICD-10-CM | POA: Diagnosis not present

## 2016-10-20 DIAGNOSIS — N480 Leukoplakia of penis: Secondary | ICD-10-CM | POA: Diagnosis not present

## 2016-10-20 DIAGNOSIS — N489 Disorder of penis, unspecified: Secondary | ICD-10-CM | POA: Diagnosis not present

## 2016-10-21 DIAGNOSIS — G43909 Migraine, unspecified, not intractable, without status migrainosus: Secondary | ICD-10-CM | POA: Diagnosis not present

## 2016-10-21 DIAGNOSIS — M5416 Radiculopathy, lumbar region: Secondary | ICD-10-CM | POA: Diagnosis not present

## 2016-10-21 DIAGNOSIS — M25559 Pain in unspecified hip: Secondary | ICD-10-CM | POA: Diagnosis not present

## 2016-10-21 DIAGNOSIS — G4710 Hypersomnia, unspecified: Secondary | ICD-10-CM | POA: Diagnosis not present

## 2016-10-21 DIAGNOSIS — M545 Low back pain: Secondary | ICD-10-CM | POA: Diagnosis not present

## 2016-10-21 DIAGNOSIS — I951 Orthostatic hypotension: Secondary | ICD-10-CM | POA: Diagnosis not present

## 2016-10-21 DIAGNOSIS — M797 Fibromyalgia: Secondary | ICD-10-CM | POA: Diagnosis not present

## 2016-10-21 DIAGNOSIS — Z79891 Long term (current) use of opiate analgesic: Secondary | ICD-10-CM | POA: Diagnosis not present

## 2016-10-21 DIAGNOSIS — G4701 Insomnia due to medical condition: Secondary | ICD-10-CM | POA: Diagnosis not present

## 2016-10-27 DIAGNOSIS — G8929 Other chronic pain: Secondary | ICD-10-CM | POA: Diagnosis not present

## 2016-10-27 DIAGNOSIS — N35014 Post-traumatic urethral stricture, male, unspecified: Secondary | ICD-10-CM | POA: Diagnosis not present

## 2016-10-27 DIAGNOSIS — Z87891 Personal history of nicotine dependence: Secondary | ICD-10-CM | POA: Diagnosis not present

## 2016-10-27 DIAGNOSIS — N480 Leukoplakia of penis: Secondary | ICD-10-CM | POA: Diagnosis not present

## 2016-10-27 DIAGNOSIS — M797 Fibromyalgia: Secondary | ICD-10-CM | POA: Diagnosis not present

## 2016-10-27 DIAGNOSIS — G629 Polyneuropathy, unspecified: Secondary | ICD-10-CM | POA: Diagnosis not present

## 2016-10-27 DIAGNOSIS — N509 Disorder of male genital organs, unspecified: Secondary | ICD-10-CM | POA: Diagnosis not present

## 2016-10-27 DIAGNOSIS — N359 Urethral stricture, unspecified: Secondary | ICD-10-CM | POA: Diagnosis not present

## 2016-11-18 ENCOUNTER — Other Ambulatory Visit: Payer: Self-pay | Admitting: Cardiovascular Disease

## 2016-11-26 DIAGNOSIS — I10 Essential (primary) hypertension: Secondary | ICD-10-CM | POA: Diagnosis not present

## 2016-11-26 DIAGNOSIS — Z885 Allergy status to narcotic agent status: Secondary | ICD-10-CM | POA: Diagnosis not present

## 2016-11-26 DIAGNOSIS — Z7982 Long term (current) use of aspirin: Secondary | ICD-10-CM | POA: Diagnosis not present

## 2016-11-26 DIAGNOSIS — E785 Hyperlipidemia, unspecified: Secondary | ICD-10-CM | POA: Diagnosis not present

## 2016-11-26 DIAGNOSIS — Z955 Presence of coronary angioplasty implant and graft: Secondary | ICD-10-CM | POA: Diagnosis not present

## 2016-11-26 DIAGNOSIS — I252 Old myocardial infarction: Secondary | ICD-10-CM | POA: Diagnosis not present

## 2016-11-26 DIAGNOSIS — I440 Atrioventricular block, first degree: Secondary | ICD-10-CM | POA: Diagnosis not present

## 2016-11-26 DIAGNOSIS — M797 Fibromyalgia: Secondary | ICD-10-CM | POA: Diagnosis not present

## 2016-11-26 DIAGNOSIS — Z79899 Other long term (current) drug therapy: Secondary | ICD-10-CM | POA: Diagnosis not present

## 2016-11-26 DIAGNOSIS — Z888 Allergy status to other drugs, medicaments and biological substances status: Secondary | ICD-10-CM | POA: Diagnosis not present

## 2016-11-26 DIAGNOSIS — Z87891 Personal history of nicotine dependence: Secondary | ICD-10-CM | POA: Diagnosis not present

## 2016-11-26 DIAGNOSIS — N359 Urethral stricture, unspecified: Secondary | ICD-10-CM | POA: Diagnosis not present

## 2016-11-26 DIAGNOSIS — Z01818 Encounter for other preprocedural examination: Secondary | ICD-10-CM | POA: Diagnosis not present

## 2016-11-26 DIAGNOSIS — G629 Polyneuropathy, unspecified: Secondary | ICD-10-CM | POA: Diagnosis not present

## 2016-11-26 DIAGNOSIS — R001 Bradycardia, unspecified: Secondary | ICD-10-CM | POA: Diagnosis not present

## 2016-12-05 DIAGNOSIS — I2510 Atherosclerotic heart disease of native coronary artery without angina pectoris: Secondary | ICD-10-CM | POA: Diagnosis not present

## 2016-12-05 DIAGNOSIS — Z888 Allergy status to other drugs, medicaments and biological substances status: Secondary | ICD-10-CM | POA: Diagnosis not present

## 2016-12-05 DIAGNOSIS — N480 Leukoplakia of penis: Secondary | ICD-10-CM | POA: Diagnosis not present

## 2016-12-05 DIAGNOSIS — Z79899 Other long term (current) drug therapy: Secondary | ICD-10-CM | POA: Diagnosis not present

## 2016-12-05 DIAGNOSIS — M797 Fibromyalgia: Secondary | ICD-10-CM | POA: Diagnosis not present

## 2016-12-05 DIAGNOSIS — N35111 Postinfective urethral stricture, not elsewhere classified, male, meatal: Secondary | ICD-10-CM | POA: Diagnosis not present

## 2016-12-05 DIAGNOSIS — G629 Polyneuropathy, unspecified: Secondary | ICD-10-CM | POA: Diagnosis not present

## 2016-12-05 DIAGNOSIS — N359 Urethral stricture, unspecified: Secondary | ICD-10-CM | POA: Diagnosis not present

## 2016-12-05 DIAGNOSIS — Z955 Presence of coronary angioplasty implant and graft: Secondary | ICD-10-CM | POA: Diagnosis not present

## 2016-12-05 DIAGNOSIS — I10 Essential (primary) hypertension: Secondary | ICD-10-CM | POA: Diagnosis not present

## 2016-12-05 DIAGNOSIS — Z7902 Long term (current) use of antithrombotics/antiplatelets: Secondary | ICD-10-CM | POA: Diagnosis not present

## 2016-12-05 DIAGNOSIS — I252 Old myocardial infarction: Secondary | ICD-10-CM | POA: Diagnosis not present

## 2016-12-05 DIAGNOSIS — E669 Obesity, unspecified: Secondary | ICD-10-CM | POA: Diagnosis not present

## 2016-12-05 DIAGNOSIS — Z7982 Long term (current) use of aspirin: Secondary | ICD-10-CM | POA: Diagnosis not present

## 2016-12-05 DIAGNOSIS — E785 Hyperlipidemia, unspecified: Secondary | ICD-10-CM | POA: Diagnosis not present

## 2016-12-05 DIAGNOSIS — Z87891 Personal history of nicotine dependence: Secondary | ICD-10-CM | POA: Diagnosis not present

## 2016-12-11 DIAGNOSIS — S0502XA Injury of conjunctiva and corneal abrasion without foreign body, left eye, initial encounter: Secondary | ICD-10-CM | POA: Diagnosis not present

## 2016-12-18 DIAGNOSIS — N400 Enlarged prostate without lower urinary tract symptoms: Secondary | ICD-10-CM | POA: Diagnosis not present

## 2016-12-18 DIAGNOSIS — N480 Leukoplakia of penis: Secondary | ICD-10-CM | POA: Diagnosis not present

## 2017-01-05 ENCOUNTER — Other Ambulatory Visit: Payer: Self-pay | Admitting: Cardiovascular Disease

## 2017-01-09 DIAGNOSIS — M545 Low back pain: Secondary | ICD-10-CM | POA: Diagnosis not present

## 2017-01-09 DIAGNOSIS — M5416 Radiculopathy, lumbar region: Secondary | ICD-10-CM | POA: Diagnosis not present

## 2017-01-09 DIAGNOSIS — G4701 Insomnia due to medical condition: Secondary | ICD-10-CM | POA: Diagnosis not present

## 2017-01-09 DIAGNOSIS — G4710 Hypersomnia, unspecified: Secondary | ICD-10-CM | POA: Diagnosis not present

## 2017-01-09 DIAGNOSIS — M25559 Pain in unspecified hip: Secondary | ICD-10-CM | POA: Diagnosis not present

## 2017-01-09 DIAGNOSIS — Z79891 Long term (current) use of opiate analgesic: Secondary | ICD-10-CM | POA: Diagnosis not present

## 2017-01-09 DIAGNOSIS — G43909 Migraine, unspecified, not intractable, without status migrainosus: Secondary | ICD-10-CM | POA: Diagnosis not present

## 2017-01-09 DIAGNOSIS — M797 Fibromyalgia: Secondary | ICD-10-CM | POA: Diagnosis not present

## 2017-01-09 DIAGNOSIS — I951 Orthostatic hypotension: Secondary | ICD-10-CM | POA: Diagnosis not present

## 2017-01-19 DIAGNOSIS — G43909 Migraine, unspecified, not intractable, without status migrainosus: Secondary | ICD-10-CM | POA: Diagnosis not present

## 2017-01-19 DIAGNOSIS — M25559 Pain in unspecified hip: Secondary | ICD-10-CM | POA: Diagnosis not present

## 2017-01-19 DIAGNOSIS — Z79891 Long term (current) use of opiate analgesic: Secondary | ICD-10-CM | POA: Diagnosis not present

## 2017-01-19 DIAGNOSIS — M797 Fibromyalgia: Secondary | ICD-10-CM | POA: Diagnosis not present

## 2017-01-21 ENCOUNTER — Other Ambulatory Visit (HOSPITAL_COMMUNITY): Payer: Self-pay | Admitting: Neurology

## 2017-01-21 DIAGNOSIS — S0990XA Unspecified injury of head, initial encounter: Secondary | ICD-10-CM

## 2017-01-30 ENCOUNTER — Ambulatory Visit (HOSPITAL_COMMUNITY)
Admission: RE | Admit: 2017-01-30 | Discharge: 2017-01-30 | Disposition: A | Discharge status: Home / Self Care | Payer: Medicare Other | Source: Ambulatory Visit | Attending: Neurology | Admitting: Neurology

## 2017-01-30 DIAGNOSIS — X58XXXA Exposure to other specified factors, initial encounter: Secondary | ICD-10-CM | POA: Insufficient documentation

## 2017-01-30 DIAGNOSIS — S0990XA Unspecified injury of head, initial encounter: Secondary | ICD-10-CM | POA: Diagnosis not present

## 2017-02-03 DIAGNOSIS — Z23 Encounter for immunization: Secondary | ICD-10-CM | POA: Diagnosis not present

## 2017-02-19 DIAGNOSIS — N480 Leukoplakia of penis: Secondary | ICD-10-CM | POA: Diagnosis not present

## 2017-02-19 DIAGNOSIS — Z09 Encounter for follow-up examination after completed treatment for conditions other than malignant neoplasm: Secondary | ICD-10-CM | POA: Diagnosis not present

## 2017-03-19 DIAGNOSIS — I2510 Atherosclerotic heart disease of native coronary artery without angina pectoris: Secondary | ICD-10-CM | POA: Diagnosis not present

## 2017-03-19 DIAGNOSIS — K580 Irritable bowel syndrome with diarrhea: Secondary | ICD-10-CM | POA: Diagnosis not present

## 2017-03-19 DIAGNOSIS — F419 Anxiety disorder, unspecified: Secondary | ICD-10-CM | POA: Diagnosis not present

## 2017-03-19 DIAGNOSIS — J449 Chronic obstructive pulmonary disease, unspecified: Secondary | ICD-10-CM | POA: Diagnosis not present

## 2017-03-19 DIAGNOSIS — I10 Essential (primary) hypertension: Secondary | ICD-10-CM | POA: Diagnosis not present

## 2017-03-19 DIAGNOSIS — E291 Testicular hypofunction: Secondary | ICD-10-CM | POA: Diagnosis not present

## 2017-03-19 DIAGNOSIS — M797 Fibromyalgia: Secondary | ICD-10-CM | POA: Diagnosis not present

## 2017-04-08 DIAGNOSIS — L219 Seborrheic dermatitis, unspecified: Secondary | ICD-10-CM | POA: Diagnosis not present

## 2017-04-08 DIAGNOSIS — Z6839 Body mass index (BMI) 39.0-39.9, adult: Secondary | ICD-10-CM | POA: Diagnosis not present

## 2017-04-08 DIAGNOSIS — M797 Fibromyalgia: Secondary | ICD-10-CM | POA: Diagnosis not present

## 2017-04-08 DIAGNOSIS — E291 Testicular hypofunction: Secondary | ICD-10-CM | POA: Diagnosis not present

## 2017-04-08 DIAGNOSIS — I10 Essential (primary) hypertension: Secondary | ICD-10-CM | POA: Diagnosis not present

## 2017-04-15 DIAGNOSIS — G4459 Other complicated headache syndrome: Secondary | ICD-10-CM | POA: Diagnosis not present

## 2017-04-15 DIAGNOSIS — Z79891 Long term (current) use of opiate analgesic: Secondary | ICD-10-CM | POA: Diagnosis not present

## 2017-04-15 DIAGNOSIS — M545 Low back pain: Secondary | ICD-10-CM | POA: Diagnosis not present

## 2017-04-15 DIAGNOSIS — M5416 Radiculopathy, lumbar region: Secondary | ICD-10-CM | POA: Diagnosis not present

## 2017-04-24 DIAGNOSIS — J209 Acute bronchitis, unspecified: Secondary | ICD-10-CM | POA: Diagnosis not present

## 2017-05-08 DIAGNOSIS — M5416 Radiculopathy, lumbar region: Secondary | ICD-10-CM | POA: Diagnosis not present

## 2017-05-08 DIAGNOSIS — M545 Low back pain: Secondary | ICD-10-CM | POA: Diagnosis not present

## 2017-07-09 DIAGNOSIS — M5416 Radiculopathy, lumbar region: Secondary | ICD-10-CM | POA: Diagnosis not present

## 2017-07-09 DIAGNOSIS — Z79891 Long term (current) use of opiate analgesic: Secondary | ICD-10-CM | POA: Diagnosis not present

## 2017-07-09 DIAGNOSIS — G894 Chronic pain syndrome: Secondary | ICD-10-CM | POA: Diagnosis not present

## 2017-07-09 DIAGNOSIS — G4459 Other complicated headache syndrome: Secondary | ICD-10-CM | POA: Diagnosis not present

## 2017-07-09 DIAGNOSIS — M545 Low back pain: Secondary | ICD-10-CM | POA: Diagnosis not present

## 2017-07-30 DIAGNOSIS — M25512 Pain in left shoulder: Secondary | ICD-10-CM | POA: Diagnosis not present

## 2017-08-07 ENCOUNTER — Other Ambulatory Visit: Payer: Self-pay | Admitting: Cardiovascular Disease

## 2017-08-21 DIAGNOSIS — M5416 Radiculopathy, lumbar region: Secondary | ICD-10-CM | POA: Diagnosis not present

## 2017-08-21 DIAGNOSIS — M545 Low back pain: Secondary | ICD-10-CM | POA: Diagnosis not present

## 2017-09-03 DIAGNOSIS — M545 Low back pain: Secondary | ICD-10-CM | POA: Diagnosis not present

## 2017-09-03 DIAGNOSIS — G4459 Other complicated headache syndrome: Secondary | ICD-10-CM | POA: Diagnosis not present

## 2017-09-03 DIAGNOSIS — M5416 Radiculopathy, lumbar region: Secondary | ICD-10-CM | POA: Diagnosis not present

## 2017-09-03 DIAGNOSIS — Z79891 Long term (current) use of opiate analgesic: Secondary | ICD-10-CM | POA: Diagnosis not present

## 2017-09-09 ENCOUNTER — Other Ambulatory Visit: Payer: Self-pay | Admitting: Cardiovascular Disease

## 2017-09-23 DIAGNOSIS — R5383 Other fatigue: Secondary | ICD-10-CM | POA: Diagnosis not present

## 2017-09-23 DIAGNOSIS — I10 Essential (primary) hypertension: Secondary | ICD-10-CM | POA: Diagnosis not present

## 2017-09-23 DIAGNOSIS — M797 Fibromyalgia: Secondary | ICD-10-CM | POA: Diagnosis not present

## 2017-09-23 DIAGNOSIS — J449 Chronic obstructive pulmonary disease, unspecified: Secondary | ICD-10-CM | POA: Diagnosis not present

## 2017-09-23 DIAGNOSIS — K580 Irritable bowel syndrome with diarrhea: Secondary | ICD-10-CM | POA: Diagnosis not present

## 2017-09-23 DIAGNOSIS — D649 Anemia, unspecified: Secondary | ICD-10-CM | POA: Diagnosis not present

## 2017-09-23 DIAGNOSIS — E291 Testicular hypofunction: Secondary | ICD-10-CM | POA: Diagnosis not present

## 2017-09-23 DIAGNOSIS — F419 Anxiety disorder, unspecified: Secondary | ICD-10-CM | POA: Diagnosis not present

## 2017-09-23 DIAGNOSIS — R739 Hyperglycemia, unspecified: Secondary | ICD-10-CM | POA: Diagnosis not present

## 2017-09-30 ENCOUNTER — Other Ambulatory Visit: Payer: Self-pay

## 2017-09-30 MED ORDER — NITROGLYCERIN 0.4 MG SL SUBL: SUBLINGUAL_TABLET | SUBLINGUAL | 1 refills | Status: DC

## 2017-09-30 NOTE — Telephone Encounter (Signed)
Refilled ntg °

## 2017-10-01 DIAGNOSIS — N480 Leukoplakia of penis: Secondary | ICD-10-CM | POA: Diagnosis not present

## 2017-10-01 DIAGNOSIS — N4889 Other specified disorders of penis: Secondary | ICD-10-CM | POA: Diagnosis not present

## 2017-10-01 DIAGNOSIS — N35919 Unspecified urethral stricture, male, unspecified site: Secondary | ICD-10-CM | POA: Diagnosis not present

## 2017-10-01 DIAGNOSIS — Z9889 Other specified postprocedural states: Secondary | ICD-10-CM | POA: Diagnosis not present

## 2017-10-01 DIAGNOSIS — N475 Adhesions of prepuce and glans penis: Secondary | ICD-10-CM | POA: Diagnosis not present

## 2017-10-01 DIAGNOSIS — N4883 Acquired buried penis: Secondary | ICD-10-CM | POA: Diagnosis not present

## 2017-10-09 ENCOUNTER — Other Ambulatory Visit: Payer: Self-pay | Admitting: Cardiovascular Disease

## 2017-10-09 ENCOUNTER — Ambulatory Visit (INDEPENDENT_AMBULATORY_CARE_PROVIDER_SITE_OTHER): Payer: Medicare Other | Admitting: Cardiovascular Disease

## 2017-10-09 ENCOUNTER — Encounter: Payer: Self-pay | Admitting: *Deleted

## 2017-10-09 VITALS — BP 126/76 | HR 61 | Ht 68.5 in | Wt 253.6 lb

## 2017-10-09 DIAGNOSIS — I10 Essential (primary) hypertension: Secondary | ICD-10-CM

## 2017-10-09 DIAGNOSIS — Z01818 Encounter for other preprocedural examination: Secondary | ICD-10-CM | POA: Diagnosis not present

## 2017-10-09 DIAGNOSIS — E785 Hyperlipidemia, unspecified: Secondary | ICD-10-CM | POA: Diagnosis not present

## 2017-10-09 DIAGNOSIS — I25118 Atherosclerotic heart disease of native coronary artery with other forms of angina pectoris: Secondary | ICD-10-CM | POA: Diagnosis not present

## 2017-10-09 DIAGNOSIS — I209 Angina pectoris, unspecified: Secondary | ICD-10-CM

## 2017-10-09 DIAGNOSIS — I5032 Chronic diastolic (congestive) heart failure: Secondary | ICD-10-CM

## 2017-10-09 NOTE — H&P (View-Only) (Signed)
SUBJECTIVE: The patient presents for follow-up of coronary artery disease and chronic diastolic heart failure.  He underwent drug-eluting stent placement to the LAD and PTCA of D1.  Nuclear stress test on 08/28/2016 showed no evidence of ischemia, LVEF 55-65%.  He underwent coronary angiography on 01/09/15. Drug-eluting stent was widely patent with moderate ostial stenosis of a jailed D1 of 50%.  He also has a history of anxiety with panic attacks and claustrophobia, along with chronic pain for which he takes Dilaudid and Motrin.  Over the past 3 months, he has had a significant worsening of his symptoms.  He takes his pitbull for a walk twice a day in his driveway 0.1 miles long.  By the end he is completely worn out.  He is now unable to carry a 34 pound of dog food up his 4 steps to his house without feeling completely fatigued.  He has been having frequent chest pain and using nitroglycerin fairly frequently.  He had not been doing so for the previous 9 months.  He is also been having bilateral leg swelling, left greater than right.  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  He also has fibromyalgia and has diffuse pain.  ECG performed today which I personally reviewed demonstrates sinus bradycardia (58 bpm) with first-degree AV block, PR interval 224 ms.    Review of Systems: As per "subjective", otherwise negative.  Allergies  Allergen Reactions  . Bee Venom Anaphylaxis  . Duloxetine Swelling    CHF  . Fentanyl Other (See Comments)    Confusion, panic attacks  . Lidocaine Other (See Comments)    REACTION: non-reactive  . Meperidine Hcl Other (See Comments)    REACTION: causes extreme mental reactions.  . Methadone Other (See Comments)    REACTION: causes heart failure  . Oxycodone Hcl Other (See Comments)    REACTION: causes heart failure  . Pregabalin Other (See Comments)   REACTION: chf  . Zolpidem Tartrate Other (See Comments)    REACTION: confusion    Current Outpatient Medications  Medication Sig Dispense Refill  . aspirin EC 81 MG tablet Take 81 mg by mouth at bedtime.     Marland Kitchen atorvastatin (LIPITOR) 40 MG tablet TAKE 1 TABLET EVERY DAY 90 tablet 3  . b complex vitamins tablet Take 1 tablet by mouth daily.    . Butalbital-APAP-Caffeine 50-325-40 MG capsule Take 1 capsule by mouth 3 (three) times daily as needed.    . cholecalciferol (VITAMIN D) 1000 UNITS tablet Take 2,000 Units by mouth every morning.     . citalopram (CELEXA) 20 MG tablet Take 1 tablet by mouth daily.    . clonazePAM (KLONOPIN) 0.5 MG tablet Take 0.5 mg by mouth 3 (three) times daily as needed for anxiety.    . clopidogrel (PLAVIX) 75 MG tablet TAKE 1 TABLET DAILY WITH BREAKFAST. (NEED MD APPT) 90 tablet 1  . CO ENZYME Q-10 PO Take 1 tablet by mouth every other day.     . diphenhydrAMINE (BENADRYL) 25 MG tablet Take 25-50 mg by mouth 2 (two) times daily as needed for itching.     . fluticasone (CUTIVATE) 0.05 % cream Apply 1 application topically 2 (two) times daily. Applied to face and/or penis area(s)    . furosemide (LASIX) 40 MG tablet Take 120 mg by mouth daily.    Marland Kitchen gabapentin (NEURONTIN) 600 MG tablet Take 1,800 mg by  mouth 2 (two) times daily.     Marland Kitchen guaifenesin (HUMIBID E) 400 MG TABS tablet Take 400 mg by mouth daily as needed (for post nasal drip/cough).    Marland Kitchen HYDROmorphone (DILAUDID) 4 MG tablet Take 1 tablet by mouth 4 (four) times daily.     Marland Kitchen ibuprofen (ADVIL,MOTRIN) 200 MG tablet Take 200-600 mg by mouth daily as needed for mild pain or moderate pain.    . isosorbide mononitrate (IMDUR) 60 MG 24 hr tablet Take 1.5 tablets (90 mg total) by mouth daily. 135 tablet 3  . L-Lysine 500 MG TABS Take 500 mg by mouth 2 (two) times daily.     . Loperamide HCl (IMODIUM A-D PO) Take 3 capsules by mouth daily as needed (chronic diarrhea).    . losartan (COZAAR) 25 MG tablet Take 1 tablet  (25 mg total) by mouth daily. 90 tablet 3  . metoprolol succinate (TOPROL-XL) 100 MG 24 hr tablet Take 100 mg by mouth daily. Take with or immediately following a meal.    . Multiple Vitamins-Minerals (MULTIVITAMIN WITH MINERALS) tablet Take 1 tablet by mouth every other day.     . nitroGLYCERIN (NITROSTAT) 0.4 MG SL tablet DISSOLVE ONE TABLET UNDER THE TONGUE EVERY 5 MINUTES AS NEEDED FOR CHEST PAIN.  DO NOT EXCEED A TOTAL OF 3 DOSES IN 15 MINUTES 75 tablet 1  . NON FORMULARY at bedtime. CPAP     . omeprazole (PRILOSEC) 40 MG capsule Take 40 mg by mouth daily before breakfast.     . polyvinyl alcohol-povidone (HYPOTEARS) 1.4-0.6 % ophthalmic solution Place 1-2 drops into both eyes daily as needed (dry eyes). For dry eyes    . potassium chloride (K-DUR) 10 MEQ tablet Take 10-20 mEq by mouth 2 (two) times daily. 2 tablets in AM and 1 tablet in PM    . testosterone cypionate (DEPOTESTOSTERONE CYPIONATE) 200 MG/ML injection Inject 50 mg into the muscle every Friday.    . TURMERIC PO Take 1 capsule by mouth 2 (two) times daily.    . valACYclovir (VALTREX) 500 MG tablet Take 500 mg by mouth 2 (two) times daily.    . vitamin C (ASCORBIC ACID) 500 MG tablet Take 500 mg by mouth every morning.      No current facility-administered medications for this visit.     Past Medical History:  Diagnosis Date  . Anxiety    HX PANIC ATTACKS  . CHF (congestive heart failure) ([place])   . Chronic cough   . Coronary artery disease    STENT X 1 2010 Mid LAD  . Dry eyes   . Fibromyalgia   . Fluid retention   . GERD (gastroesophageal reflux disease)   . Headache(784.0)   . Herpes genitalis in men   . History of kidney stones   . History of vertigo   . Hypertension   . Narcolepsy   . Neuropathy   . Penile lesion   . Sjogren's disease ([place])   . Sleep apnea    USES C-PAP    Past Surgical History:  Procedure Laterality Date  . BREAST SURGERY     BIL MASTECTOMY  FOR BENIGN LUMPS  . CARDIAC  CATHETERIZATION N/A 01/09/2015   Procedure: Left Heart Cath and Coronary Angiography;  Surgeon: Leonie Man, MD;  Location: [facility];  Service: Cardiovascular;  Laterality: N/A;  . CARPAL TUNNELL  BIL  . CIRCUMCISION    . CORONARY ANGIOPLASTY WITH STENT PLACEMENT  X1 2010  . HERNIA REPAIR  ING HERNIA REPAIR  . LEFT HEART CATHETERIZATION WITH CORONARY ANGIOGRAM N/A 10/11/2013   Procedure: LEFT HEART CATHETERIZATION WITH CORONARY ANGIOGRAM;  Surgeon: Sinclair Grooms, MD;  Location: [facility] CATH LAB;  Service: Cardiovascular;  Laterality: N/A;  . LEFT HEART CATHETERIZATION WITH CORONARY ANGIOGRAM N/A 06/21/2014   Procedure: LEFT HEART CATHETERIZATION WITH CORONARY ANGIOGRAM;  Surgeon: Troy Sine, MD;  Location: [facility] CATH LAB;  Service: Cardiovascular;  Laterality: N/A;  . MEATOTOMY  X2  . PERCUTANEOUS CORONARY STENT INTERVENTION (PCI-S) N/A 06/23/2014   Procedure: PERCUTANEOUS CORONARY STENT INTERVENTION (PCI-S);  Surgeon: Jettie Booze, MD;  Location: [facility] CATH LAB;  Service: Cardiovascular;  Laterality: N/A;  . VASECTOMY    . WRIST FRACTURE SURGERY  L  X3 SURGERIES    Social History   Socioeconomic History  . Marital status: Married    Spouse name: Not on file  . Number of children: Not on file  . Years of education: Not on file  . Highest education level: Not on file  Occupational History  . Not on file  Social Needs  . Financial resource strain: Not on file  . Food insecurity:    Worry: Not on file    Inability: Not on file  . Transportation needs:    Medical: Not on file    Non-medical: Not on file  Tobacco Use  . Smoking status: Former Smoker    Packs/day: 2.50    Years: 32.00    Pack years: 80.00    Types: Cigarettes    Start date: 04/17/1967    Last attempt to quit: 05/12/2000    Years since quitting: 17.4  . Smokeless tobacco: Never Used  Substance and Sexual Activity  . Alcohol use: No    Alcohol/week: 0.0 oz    Comment: RECOVERING ALCOHOLIC 9 YRS-  9678  . Drug use: No  . Sexual activity: Not on file  Lifestyle  . Physical activity:    Days per week: Not on file    Minutes per session: Not on file  . Stress: Not on file  Relationships  . Social connections:    Talks on phone: Not on file    Gets together: Not on file    Attends religious service: Not on file    Active member of club or organization: Not on file    Attends meetings of clubs or organizations: Not on file    Relationship status: Not on file  . Intimate partner violence:    Fear of current or ex partner: Not on file    Emotionally abused: Not on file    Physically abused: Not on file    Forced sexual activity: Not on file  Other Topics Concern  . Not on file  Social History Narrative  . Not on file     Vitals:   10/09/17 1547  BP: 126/76  Pulse: 61  SpO2: 98%  Weight: 253 lb 9.6 oz (115 kg)  Height: 5' 8.5" (1.74 m)    Wt Readings from Last 3 Encounters:  10/09/17 253 lb 9.6 oz (115 kg)  09/24/16 263 lb (119.3 kg)  08/18/16 261 lb (118.4 kg)     PHYSICAL EXAM General: NAD HEENT: Normal. Neck: No JVD, no thyromegaly. Lungs: Clear to auscultation bilaterally with normal respiratory effort. CV: Regular rate and rhythm, normal S1/S2, no S3/S4, no murmur. No pretibial or periankle edema.  Bilateral venous varicosities.  No carotid bruit.   Abdomen: Soft, nontender, no distention.  Neurologic: Alert and  oriented.  Psych: Normal affect. Skin: Normal. Musculoskeletal: No gross deformities.    ECG: Most recent ECG reviewed.   Labs: Lab Results  Component Value Date/Time   K 4.1 01/09/2015 01:44 PM   BUN 8 01/09/2015 01:44 PM   CREATININE 0.92 01/09/2015 01:44 PM   ALT 21 07/30/2011 09:18 PM   HGB 12.9 (L) 01/09/2015 12:02 PM     Lipids: Lab Results  Component Value Date/Time   LDLCALC 100 (H) 10/11/2013 03:57 AM   CHOL 168 10/11/2013 03:57 AM   TRIG 172 (H) 10/11/2013 03:57 AM   HDL 34 (L) 10/11/2013 03:57 AM       ASSESSMENT  AND PLAN:  1.  Accelerating angina in the context of CAD s/p LAD DES and PTCA of D1:  He has had a fairly recent escalation of anginal symptoms.  No ischemia by stress testing in April 2018 as detailed above. Continue ASA, Plavix (for now), Lipitor, Imdur, and Toprol-XL 100 mg daily.   I will arrange for left heart catheterization and coronary angiography. Risks and benefits of cardiac catheterization have been discussed with the patient.  These include bleeding, infection, kidney damage, stroke, heart attack, death.  The patient understands these risks and is willing to proceed.  2. Essential HTN: Controlled on present therapy. No changes.  3. Hyperlipidemia: Continue Lipitor 40 mg.   I will obtain a copy of lipids from PCP.  4. Chronic diastolic heart failure: Compensated on Lasix 40 mg three times daily. No changes.     Disposition: Follow up 6 weeks (after cath).  Time spent: 40 minutes, of which greater than 50% was spent reviewing symptoms, relevant blood tests and studies, and discussing management plan with the patient.    Kate Sable, M.D., F.A.C.C.

## 2017-10-09 NOTE — Patient Instructions (Addendum)
Medication Instructions:  Your physician recommends that you continue on your current medications as directed. Please refer to the Current Medication list given to you today.   Labwork: Monday  bmet cbc  Testing/Procedures: Your physician has requested that you have a cardiac catheterization. Cardiac catheterization is used to diagnose and/or treat various heart conditions. Doctors may recommend this procedure for a number of different reasons. The most common reason is to evaluate chest pain. Chest pain can be a symptom of coronary artery disease (CAD), and cardiac catheterization can show whether plaque is narrowing or blocking your heart's arteries. This procedure is also used to evaluate the valves, as well as measure the blood flow and oxygen levels in different parts of your heart. For further information please visit [email or web address]. Please follow instruction sheet, as given.   Follow-Up: Your physician recommends that you schedule a follow-up appointment in: 1 month    Any Other Special Instructions Will Be Listed Below (If Applicable).     If you need a refill on your cardiac medications before your next appointment, please call your pharmacy.   [place] [address] Dept: [phone] Loc: [place]  10/09/2017  You are scheduled for a Cardiac Catheterization on Thursday, June 6 with Dr. Peter Martinique.  1. Please arrive at the [facility] (Main Entrance A) at [facility]: [address] [place], [place] [address] at 5:30 AM (two hours before your procedure to ensure your preparation). Free valet parking service is available.   Special note: Every effort is made to have your procedure done on time. Please understand that emergencies sometimes delay scheduled procedures.  2. Diet: Do not eat or drink anything after midnight prior to your  procedure except sips of water to take medications.  3. Labs: Monday @ [facility]   4. Medication instructions in preparation for your procedure:  Please DO NOT TAKE LASIX THE MORNING OF PROCEDURE     On the morning of your procedure, take your Aspirin and any morning medicines NOT listed above.  You may use sips of water.  5. Plan for one night stay--bring personal belongings. 6. Bring a current list of your medications and current insurance cards. 7. You MUST have a responsible person to drive you home. 8. Someone MUST be with you the first 24 hours after you arrive home or your discharge will be delayed. 9. Please wear clothes that are easy to get on and off and wear slip-on shoes.  Thank you for allowing [place] to care for you!   -- [facility]

## 2017-10-09 NOTE — Progress Notes (Signed)
SUBJECTIVE: The patient presents for follow-up of coronary artery disease and chronic diastolic heart failure.  He underwent drug-eluting stent placement to the LAD and PTCA of D1.  Nuclear stress test on 08/28/2016 showed no evidence of ischemia, LVEF 55-65%.  He underwent coronary angiography on 01/09/15. Drug-eluting stent was widely patent with moderate ostial stenosis of a jailed D1 of 50%.  He also has a history of anxiety with panic attacks and claustrophobia, along with chronic pain for which he takes Dilaudid and Motrin.  Over the past 3 months, he has had a significant worsening of his symptoms.  He takes his pitbull for a walk twice a day in his driveway 0.1 miles long.  By the end he is completely worn out.  He is now unable to carry a 34 pound of dog food up his 4 steps to his house without feeling completely fatigued.  He has been having frequent chest pain and using nitroglycerin fairly frequently.  He had not been doing so for the previous 9 months.  He is also been having bilateral leg swelling, left greater than right.  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  He also has fibromyalgia and has diffuse pain.  ECG performed today which I personally reviewed demonstrates sinus bradycardia (58 bpm) with first-degree AV block, PR interval 224 ms.    Review of Systems: As per "subjective", otherwise negative.  Allergies  Allergen Reactions  . Bee Venom Anaphylaxis  . Duloxetine Swelling    CHF  . Fentanyl Other (See Comments)    Confusion, panic attacks  . Lidocaine Other (See Comments)    REACTION: non-reactive  . Meperidine Hcl Other (See Comments)    REACTION: causes extreme mental reactions.  . Methadone Other (See Comments)    REACTION: causes heart failure  . Oxycodone Hcl Other (See Comments)    REACTION: causes heart failure  . Pregabalin Other (See Comments)   REACTION: chf  . Zolpidem Tartrate Other (See Comments)    REACTION: confusion    Current Outpatient Medications  Medication Sig Dispense Refill  . aspirin EC 81 MG tablet Take 81 mg by mouth at bedtime.     Marland Kitchen atorvastatin (LIPITOR) 40 MG tablet TAKE 1 TABLET EVERY DAY 90 tablet 3  . b complex vitamins tablet Take 1 tablet by mouth daily.    . Butalbital-APAP-Caffeine 50-325-40 MG capsule Take 1 capsule by mouth 3 (three) times daily as needed.    . cholecalciferol (VITAMIN D) 1000 UNITS tablet Take 2,000 Units by mouth every morning.     . citalopram (CELEXA) 20 MG tablet Take 1 tablet by mouth daily.    . clonazePAM (KLONOPIN) 0.5 MG tablet Take 0.5 mg by mouth 3 (three) times daily as needed for anxiety.    . clopidogrel (PLAVIX) 75 MG tablet TAKE 1 TABLET DAILY WITH BREAKFAST. (NEED MD APPT) 90 tablet 1  . CO ENZYME Q-10 PO Take 1 tablet by mouth every other day.     . diphenhydrAMINE (BENADRYL) 25 MG tablet Take 25-50 mg by mouth 2 (two) times daily as needed for itching.     . fluticasone (CUTIVATE) 0.05 % cream Apply 1 application topically 2 (two) times daily. Applied to face and/or penis area(s)    . furosemide (LASIX) 40 MG tablet Take 120 mg by mouth daily.    Marland Kitchen gabapentin (NEURONTIN) 600 MG tablet Take 1,800 mg by  mouth 2 (two) times daily.     Marland Kitchen guaifenesin (HUMIBID E) 400 MG TABS tablet Take 400 mg by mouth daily as needed (for post nasal drip/cough).    Marland Kitchen HYDROmorphone (DILAUDID) 4 MG tablet Take 1 tablet by mouth 4 (four) times daily.     Marland Kitchen ibuprofen (ADVIL,MOTRIN) 200 MG tablet Take 200-600 mg by mouth daily as needed for mild pain or moderate pain.    . isosorbide mononitrate (IMDUR) 60 MG 24 hr tablet Take 1.5 tablets (90 mg total) by mouth daily. 135 tablet 3  . L-Lysine 500 MG TABS Take 500 mg by mouth 2 (two) times daily.     . Loperamide HCl (IMODIUM A-D PO) Take 3 capsules by mouth daily as needed (chronic diarrhea).    . losartan (COZAAR) 25 MG tablet Take 1 tablet  (25 mg total) by mouth daily. 90 tablet 3  . metoprolol succinate (TOPROL-XL) 100 MG 24 hr tablet Take 100 mg by mouth daily. Take with or immediately following a meal.    . Multiple Vitamins-Minerals (MULTIVITAMIN WITH MINERALS) tablet Take 1 tablet by mouth every other day.     . nitroGLYCERIN (NITROSTAT) 0.4 MG SL tablet DISSOLVE ONE TABLET UNDER THE TONGUE EVERY 5 MINUTES AS NEEDED FOR CHEST PAIN.  DO NOT EXCEED A TOTAL OF 3 DOSES IN 15 MINUTES 75 tablet 1  . NON FORMULARY at bedtime. CPAP     . omeprazole (PRILOSEC) 40 MG capsule Take 40 mg by mouth daily before breakfast.     . polyvinyl alcohol-povidone (HYPOTEARS) 1.4-0.6 % ophthalmic solution Place 1-2 drops into both eyes daily as needed (dry eyes). For dry eyes    . potassium chloride (K-DUR) 10 MEQ tablet Take 10-20 mEq by mouth 2 (two) times daily. 2 tablets in AM and 1 tablet in PM    . testosterone cypionate (DEPOTESTOSTERONE CYPIONATE) 200 MG/ML injection Inject 50 mg into the muscle every Friday.    . TURMERIC PO Take 1 capsule by mouth 2 (two) times daily.    . valACYclovir (VALTREX) 500 MG tablet Take 500 mg by mouth 2 (two) times daily.    . vitamin C (ASCORBIC ACID) 500 MG tablet Take 500 mg by mouth every morning.      No current facility-administered medications for this visit.     Past Medical History:  Diagnosis Date  . Anxiety    HX PANIC ATTACKS  . CHF (congestive heart failure) ([place])   . Chronic cough   . Coronary artery disease    STENT X 1 2010 Mid LAD  . Dry eyes   . Fibromyalgia   . Fluid retention   . GERD (gastroesophageal reflux disease)   . Headache(784.0)   . Herpes genitalis in men   . History of kidney stones   . History of vertigo   . Hypertension   . Narcolepsy   . Neuropathy   . Penile lesion   . Sjogren's disease ([place])   . Sleep apnea    USES C-PAP    Past Surgical History:  Procedure Laterality Date  . BREAST SURGERY     BIL MASTECTOMY  FOR BENIGN LUMPS  . CARDIAC  CATHETERIZATION N/A 01/09/2015   Procedure: Left Heart Cath and Coronary Angiography;  Surgeon: Leonie Man, MD;  Location: [facility] CV LAB;  Service: Cardiovascular;  Laterality: N/A;  . CARPAL TUNNELL  BIL  . CIRCUMCISION    . CORONARY ANGIOPLASTY WITH STENT PLACEMENT  X1 2010  . HERNIA REPAIR  ING HERNIA REPAIR  . LEFT HEART CATHETERIZATION WITH CORONARY ANGIOGRAM N/A 10/11/2013   Procedure: LEFT HEART CATHETERIZATION WITH CORONARY ANGIOGRAM;  Surgeon: Sinclair Grooms, MD;  Location: [facility] CATH LAB;  Service: Cardiovascular;  Laterality: N/A;  . LEFT HEART CATHETERIZATION WITH CORONARY ANGIOGRAM N/A 06/21/2014   Procedure: LEFT HEART CATHETERIZATION WITH CORONARY ANGIOGRAM;  Surgeon: Troy Sine, MD;  Location: [facility] CATH LAB;  Service: Cardiovascular;  Laterality: N/A;  . MEATOTOMY  X2  . PERCUTANEOUS CORONARY STENT INTERVENTION (PCI-S) N/A 06/23/2014   Procedure: PERCUTANEOUS CORONARY STENT INTERVENTION (PCI-S);  Surgeon: Jettie Booze, MD;  Location: [facility] CATH LAB;  Service: Cardiovascular;  Laterality: N/A;  . VASECTOMY    . WRIST FRACTURE SURGERY  L  X3 SURGERIES    Social History   Socioeconomic History  . Marital status: Married    Spouse name: Not on file  . Number of children: Not on file  . Years of education: Not on file  . Highest education level: Not on file  Occupational History  . Not on file  Social Needs  . Financial resource strain: Not on file  . Food insecurity:    Worry: Not on file    Inability: Not on file  . Transportation needs:    Medical: Not on file    Non-medical: Not on file  Tobacco Use  . Smoking status: Former Smoker    Packs/day: 2.50    Years: 32.00    Pack years: 80.00    Types: Cigarettes    Start date: 04/17/1967    Last attempt to quit: 05/12/2000    Years since quitting: 17.4  . Smokeless tobacco: Never Used  Substance and Sexual Activity  . Alcohol use: No    Alcohol/week: 0.0 oz    Comment: RECOVERING ALCOHOLIC 9 YRS-  9563  . Drug use: No  . Sexual activity: Not on file  Lifestyle  . Physical activity:    Days per week: Not on file    Minutes per session: Not on file  . Stress: Not on file  Relationships  . Social connections:    Talks on phone: Not on file    Gets together: Not on file    Attends religious service: Not on file    Active member of club or organization: Not on file    Attends meetings of clubs or organizations: Not on file    Relationship status: Not on file  . Intimate partner violence:    Fear of current or ex partner: Not on file    Emotionally abused: Not on file    Physically abused: Not on file    Forced sexual activity: Not on file  Other Topics Concern  . Not on file  Social History Narrative  . Not on file     Vitals:   10/09/17 1547  BP: 126/76  Pulse: 61  SpO2: 98%  Weight: 253 lb 9.6 oz (115 kg)  Height: 5' 8.5" (1.74 m)    Wt Readings from Last 3 Encounters:  10/09/17 253 lb 9.6 oz (115 kg)  09/24/16 263 lb (119.3 kg)  08/18/16 261 lb (118.4 kg)     PHYSICAL EXAM General: NAD HEENT: Normal. Neck: No JVD, no thyromegaly. Lungs: Clear to auscultation bilaterally with normal respiratory effort. CV: Regular rate and rhythm, normal S1/S2, no S3/S4, no murmur. No pretibial or periankle edema.  Bilateral venous varicosities.  No carotid bruit.   Abdomen: Soft, nontender, no distention.  Neurologic: Alert and  oriented.  Psych: Normal affect. Skin: Normal. Musculoskeletal: No gross deformities.    ECG: Most recent ECG reviewed.   Labs: Lab Results  Component Value Date/Time   K 4.1 01/09/2015 01:44 PM   BUN 8 01/09/2015 01:44 PM   CREATININE 0.92 01/09/2015 01:44 PM   ALT 21 07/30/2011 09:18 PM   HGB 12.9 (L) 01/09/2015 12:02 PM     Lipids: Lab Results  Component Value Date/Time   LDLCALC 100 (H) 10/11/2013 03:57 AM   CHOL 168 10/11/2013 03:57 AM   TRIG 172 (H) 10/11/2013 03:57 AM   HDL 34 (L) 10/11/2013 03:57 AM       ASSESSMENT  AND PLAN:  1.  Accelerating angina in the context of CAD s/p LAD DES and PTCA of D1:  He has had a fairly recent escalation of anginal symptoms.  No ischemia by stress testing in April 2018 as detailed above. Continue ASA, Plavix (for now), Lipitor, Imdur, and Toprol-XL 100 mg daily.   I will arrange for left heart catheterization and coronary angiography. Risks and benefits of cardiac catheterization have been discussed with the patient.  These include bleeding, infection, kidney damage, stroke, heart attack, death.  The patient understands these risks and is willing to proceed.  2. Essential HTN: Controlled on present therapy. No changes.  3. Hyperlipidemia: Continue Lipitor 40 mg.   I will obtain a copy of lipids from PCP.  4. Chronic diastolic heart failure: Compensated on Lasix 40 mg three times daily. No changes.     Disposition: Follow up 6 weeks (after cath).  Time spent: 40 minutes, of which greater than 50% was spent reviewing symptoms, relevant blood tests and studies, and discussing management plan with the patient.    Kate Sable, M.D., F.A.C.C.

## 2017-10-12 DIAGNOSIS — Z6837 Body mass index (BMI) 37.0-37.9, adult: Secondary | ICD-10-CM | POA: Diagnosis not present

## 2017-10-12 DIAGNOSIS — I10 Essential (primary) hypertension: Secondary | ICD-10-CM | POA: Diagnosis not present

## 2017-10-12 DIAGNOSIS — R947 Abnormal results of other endocrine function studies: Secondary | ICD-10-CM | POA: Diagnosis not present

## 2017-10-12 DIAGNOSIS — K580 Irritable bowel syndrome with diarrhea: Secondary | ICD-10-CM | POA: Diagnosis not present

## 2017-10-12 DIAGNOSIS — M797 Fibromyalgia: Secondary | ICD-10-CM | POA: Diagnosis not present

## 2017-10-12 DIAGNOSIS — Z0001 Encounter for general adult medical examination with abnormal findings: Secondary | ICD-10-CM | POA: Diagnosis not present

## 2017-10-12 DIAGNOSIS — I2510 Atherosclerotic heart disease of native coronary artery without angina pectoris: Secondary | ICD-10-CM | POA: Diagnosis not present

## 2017-10-12 DIAGNOSIS — F419 Anxiety disorder, unspecified: Secondary | ICD-10-CM | POA: Diagnosis not present

## 2017-10-13 ENCOUNTER — Telehealth: Payer: Self-pay | Admitting: *Deleted

## 2017-10-13 NOTE — Telephone Encounter (Signed)
Follow up    Patient is returning call in reference to heart cath instructions. Please call. He says to call around 4:30 if possible or tomorrow around 11:30am.

## 2017-10-13 NOTE — Telephone Encounter (Addendum)
Catheterization scheduled at [facility] for: Thursday October 15, 2017 7:30 AM Verify arrival time and place: [facility] at: 5:30 AM  No solid food after midnight prior to cath, clear liquids until 5 AM day of procedure. Verify no diabetes medications.  Hold: Furosemide AM of procedure. KCl AM of procedure.  Except hold medications AM meds can be  taken pre-cath with sip of water including: Clopidogrel 75 mg ASA 81 mg  Confirm patient has responsible person to drive home post procedure and observe patient for 24 hours-yes  Pt had CBC/BMET  done 09/23/17 at his PCP, I will fax to Short Stay and Cath Lab, pt will take a copy with him to hospital tomorrow morning.   Instructions reviewed with patient, patient verbalized understanding, thanked me for call.

## 2017-10-13 NOTE — Telephone Encounter (Signed)
Answering machine, will try again in morning about 11:30 AM

## 2017-10-15 ENCOUNTER — Encounter (HOSPITAL_COMMUNITY): Payer: Self-pay | Admitting: *Deleted

## 2017-10-15 ENCOUNTER — Ambulatory Visit (HOSPITAL_COMMUNITY)
Admission: RE | Disposition: A | Discharge status: Home / Self Care | Payer: Self-pay | Source: Ambulatory Visit | Attending: Cardiology

## 2017-10-15 ENCOUNTER — Ambulatory Visit (HOSPITAL_COMMUNITY)
Admission: RE | Admit: 2017-10-15 | Discharge: 2017-10-15 | Disposition: A | Discharge status: Home / Self Care | Payer: Medicare Other | Source: Ambulatory Visit | Attending: Cardiology | Admitting: Cardiology

## 2017-10-15 DIAGNOSIS — Z9852 Vasectomy status: Secondary | ICD-10-CM | POA: Diagnosis not present

## 2017-10-15 DIAGNOSIS — F1011 Alcohol abuse, in remission: Secondary | ICD-10-CM | POA: Insufficient documentation

## 2017-10-15 DIAGNOSIS — K219 Gastro-esophageal reflux disease without esophagitis: Secondary | ICD-10-CM | POA: Insufficient documentation

## 2017-10-15 DIAGNOSIS — Z87891 Personal history of nicotine dependence: Secondary | ICD-10-CM | POA: Insufficient documentation

## 2017-10-15 DIAGNOSIS — M797 Fibromyalgia: Secondary | ICD-10-CM | POA: Insufficient documentation

## 2017-10-15 DIAGNOSIS — E785 Hyperlipidemia, unspecified: Secondary | ICD-10-CM | POA: Insufficient documentation

## 2017-10-15 DIAGNOSIS — Z87442 Personal history of urinary calculi: Secondary | ICD-10-CM | POA: Insufficient documentation

## 2017-10-15 DIAGNOSIS — F410 Panic disorder [episodic paroxysmal anxiety] without agoraphobia: Secondary | ICD-10-CM | POA: Diagnosis not present

## 2017-10-15 DIAGNOSIS — Z7902 Long term (current) use of antithrombotics/antiplatelets: Secondary | ICD-10-CM | POA: Insufficient documentation

## 2017-10-15 DIAGNOSIS — M3500 Sicca syndrome, unspecified: Secondary | ICD-10-CM | POA: Insufficient documentation

## 2017-10-15 DIAGNOSIS — Z91030 Bee allergy status: Secondary | ICD-10-CM | POA: Diagnosis not present

## 2017-10-15 DIAGNOSIS — Z7982 Long term (current) use of aspirin: Secondary | ICD-10-CM | POA: Diagnosis not present

## 2017-10-15 DIAGNOSIS — Z79899 Other long term (current) drug therapy: Secondary | ICD-10-CM | POA: Diagnosis not present

## 2017-10-15 DIAGNOSIS — Z9861 Coronary angioplasty status: Secondary | ICD-10-CM

## 2017-10-15 DIAGNOSIS — Z9889 Other specified postprocedural states: Secondary | ICD-10-CM | POA: Diagnosis not present

## 2017-10-15 DIAGNOSIS — I25119 Atherosclerotic heart disease of native coronary artery with unspecified angina pectoris: Secondary | ICD-10-CM | POA: Diagnosis not present

## 2017-10-15 DIAGNOSIS — I209 Angina pectoris, unspecified: Secondary | ICD-10-CM

## 2017-10-15 DIAGNOSIS — I5032 Chronic diastolic (congestive) heart failure: Secondary | ICD-10-CM | POA: Insufficient documentation

## 2017-10-15 DIAGNOSIS — Z885 Allergy status to narcotic agent status: Secondary | ICD-10-CM | POA: Insufficient documentation

## 2017-10-15 DIAGNOSIS — Z955 Presence of coronary angioplasty implant and graft: Secondary | ICD-10-CM | POA: Insufficient documentation

## 2017-10-15 DIAGNOSIS — Z8619 Personal history of other infectious and parasitic diseases: Secondary | ICD-10-CM | POA: Diagnosis not present

## 2017-10-15 DIAGNOSIS — Z888 Allergy status to other drugs, medicaments and biological substances status: Secondary | ICD-10-CM | POA: Insufficient documentation

## 2017-10-15 DIAGNOSIS — Z9013 Acquired absence of bilateral breasts and nipples: Secondary | ICD-10-CM | POA: Diagnosis not present

## 2017-10-15 DIAGNOSIS — G629 Polyneuropathy, unspecified: Secondary | ICD-10-CM | POA: Insufficient documentation

## 2017-10-15 DIAGNOSIS — I10 Essential (primary) hypertension: Secondary | ICD-10-CM | POA: Diagnosis present

## 2017-10-15 DIAGNOSIS — I2510 Atherosclerotic heart disease of native coronary artery without angina pectoris: Secondary | ICD-10-CM

## 2017-10-15 DIAGNOSIS — G47419 Narcolepsy without cataplexy: Secondary | ICD-10-CM | POA: Insufficient documentation

## 2017-10-15 DIAGNOSIS — G4730 Sleep apnea, unspecified: Secondary | ICD-10-CM | POA: Insufficient documentation

## 2017-10-15 HISTORY — PX: LEFT HEART CATH AND CORONARY ANGIOGRAPHY: CATH118249

## 2017-10-15 LAB — CBC
HCT: 45.3 % (ref 39.0–52.0)
Hemoglobin: 15.2 g/dL (ref 13.0–17.0)
MCH: 32.1 pg (ref 26.0–34.0)
MCHC: 33.6 g/dL (ref 30.0–36.0)
MCV: 95.6 fL (ref 78.0–100.0)
PLATELETS: 227 10*3/uL (ref 150–400)
RBC: 4.74 MIL/uL (ref 4.22–5.81)
RDW: 12.6 % (ref 11.5–15.5)
WBC: 8.5 10*3/uL (ref 4.0–10.5)

## 2017-10-15 LAB — BASIC METABOLIC PANEL
Anion gap: 10 (ref 5–15)
BUN: 13 mg/dL (ref 6–20)
CHLORIDE: 103 mmol/L (ref 101–111)
CO2: 27 mmol/L (ref 22–32)
CREATININE: 1.14 mg/dL (ref 0.61–1.24)
Calcium: 9.1 mg/dL (ref 8.9–10.3)
GFR calc Af Amer: >60 mL/min (ref >60)
GFR calc non Af Amer: >60 mL/min (ref >60)
Glucose, Bld: 98 mg/dL (ref 65–99)
Potassium: 3.9 mmol/L (ref 3.5–5.1)
SODIUM: 140 mmol/L (ref 135–145)

## 2017-10-15 SURGERY — LEFT HEART CATH AND CORONARY ANGIOGRAPHY
Anesthesia: LOCAL

## 2017-10-15 MED ORDER — HEPARIN (PORCINE) IN NACL 2-0.9 UNITS/ML
INTRAMUSCULAR | Status: AC | PRN
  Administered 2017-10-15 (×2): 500 mL

## 2017-10-15 MED ORDER — HEPARIN SODIUM (PORCINE) 1000 UNIT/ML IJ SOLN
INTRAMUSCULAR | Status: DC | PRN
  Administered 2017-10-15: 5500 [IU] via INTRAVENOUS

## 2017-10-15 MED ORDER — ONDANSETRON HCL 4 MG/2ML IJ SOLN: 4.0000 mg | Freq: Four times a day (QID) | INTRAMUSCULAR | Status: DC | PRN

## 2017-10-15 MED ORDER — MIDAZOLAM HCL 2 MG/2ML IJ SOLN
INTRAMUSCULAR | Status: AC
  Filled 2017-10-15: qty 2

## 2017-10-15 MED ORDER — SODIUM CHLORIDE 0.9 % IV SOLN: 250.0000 mL | INTRAVENOUS | Status: DC | PRN

## 2017-10-15 MED ORDER — SODIUM CHLORIDE 0.9% FLUSH: 3.0000 mL | Freq: Two times a day (BID) | INTRAVENOUS | Status: DC

## 2017-10-15 MED ORDER — IOHEXOL 350 MG/ML SOLN
INTRAVENOUS | Status: DC | PRN
  Administered 2017-10-15: 75 mL via INTRA_ARTERIAL

## 2017-10-15 MED ORDER — HEPARIN (PORCINE) IN NACL 1000-0.9 UT/500ML-% IV SOLN
INTRAVENOUS | Status: AC
  Filled 2017-10-15: qty 1000

## 2017-10-15 MED ORDER — ASPIRIN 81 MG PO CHEW: 81.0000 mg | CHEWABLE_TABLET | ORAL | Status: DC

## 2017-10-15 MED ORDER — SODIUM CHLORIDE 0.9 % WEIGHT BASED INFUSION
3.0000 mL/kg/h | INTRAVENOUS | Status: AC
  Administered 2017-10-15: 3 mL/kg/h via INTRAVENOUS

## 2017-10-15 MED ORDER — DIAZEPAM 5 MG PO TABS
5.0000 mg | ORAL_TABLET | Freq: Once | ORAL | Status: AC
  Administered 2017-10-15: 5 mg via ORAL

## 2017-10-15 MED ORDER — FENTANYL CITRATE (PF) 100 MCG/2ML IJ SOLN
INTRAMUSCULAR | Status: AC
  Filled 2017-10-15: qty 2

## 2017-10-15 MED ORDER — MIDAZOLAM HCL 2 MG/2ML IJ SOLN
INTRAMUSCULAR | Status: DC | PRN
  Administered 2017-10-15 (×2): 1 mg via INTRAVENOUS

## 2017-10-15 MED ORDER — SODIUM CHLORIDE 0.9 % WEIGHT BASED INFUSION: 1.0000 mL/kg/h | INTRAVENOUS | Status: DC

## 2017-10-15 MED ORDER — HEPARIN SODIUM (PORCINE) 1000 UNIT/ML IJ SOLN
INTRAMUSCULAR | Status: AC
  Filled 2017-10-15: qty 1

## 2017-10-15 MED ORDER — ACETAMINOPHEN 325 MG PO TABS: 650.0000 mg | ORAL_TABLET | ORAL | Status: DC | PRN

## 2017-10-15 MED ORDER — LIDOCAINE HCL (PF) 1 % IJ SOLN
INTRAMUSCULAR | Status: DC | PRN
  Administered 2017-10-15: 2 mL via SUBCUTANEOUS

## 2017-10-15 MED ORDER — LIDOCAINE HCL (PF) 1 % IJ SOLN
INTRAMUSCULAR | Status: AC
  Filled 2017-10-15: qty 30

## 2017-10-15 MED ORDER — SODIUM CHLORIDE 0.9 % WEIGHT BASED INFUSION: 1.0000 mL/kg/h | INTRAVENOUS | Status: AC

## 2017-10-15 MED ORDER — SODIUM CHLORIDE 0.9% FLUSH: 3.0000 mL | INTRAVENOUS | Status: DC | PRN

## 2017-10-15 MED ORDER — VERAPAMIL HCL 2.5 MG/ML IV SOLN
INTRAVENOUS | Status: AC
  Filled 2017-10-15: qty 2

## 2017-10-15 MED ORDER — FENTANYL CITRATE (PF) 100 MCG/2ML IJ SOLN
INTRAMUSCULAR | Status: DC | PRN
  Administered 2017-10-15: 25 ug via INTRAVENOUS

## 2017-10-15 MED ORDER — DIAZEPAM 5 MG PO TABS
ORAL_TABLET | ORAL | Status: AC
  Filled 2017-10-15: qty 1

## 2017-10-15 MED ORDER — VERAPAMIL HCL 2.5 MG/ML IV SOLN
INTRAVENOUS | Status: DC | PRN
  Administered 2017-10-15: 10 mL via INTRA_ARTERIAL

## 2017-10-15 SURGICAL SUPPLY — 13 items
CATH 5FR JL3.5 JR4 ANG PIG MP (CATHETERS) ×1 IMPLANT
DEVICE RAD COMP TR BAND LRG (VASCULAR PRODUCTS) ×1 IMPLANT
GUIDEWIRE INQWIRE 1.5J.035X260 (WIRE) IMPLANT
INQWIRE 1.5J .035X260CM (WIRE) ×2
KIT HEART LEFT (KITS) ×2 IMPLANT
NDL PERC 21GX4CM (NEEDLE) IMPLANT
NEEDLE PERC 21GX4CM (NEEDLE) ×2 IMPLANT
PACK CARDIAC CATHETERIZATION (CUSTOM PROCEDURE TRAY) ×2 IMPLANT
SHEATH RAIN RADIAL 21G 6FR (SHEATH) ×1 IMPLANT
SYR MEDRAD MARK V 150ML (SYRINGE) ×2 IMPLANT
TRANSDUCER W/STOPCOCK (MISCELLANEOUS) ×2 IMPLANT
TUBING CIL FLEX 10 FLL-RA (TUBING) ×2 IMPLANT
WIRE HI TORQ VERSACORE-J 145CM (WIRE) ×1 IMPLANT

## 2017-10-15 NOTE — Discharge Instructions (Signed)
Drink plenty of fluids over next 48 hours and keep right wrist elevated at heart level for 24 hours ° °Radial Site Care °Refer to this sheet in the next few weeks. These instructions provide you with information about caring for yourself after your procedure. Your health care provider may also give you more specific instructions. Your treatment has been planned according to current medical practices, but problems sometimes occur. Call your health care provider if you have any problems or questions after your procedure. °What can I expect after the procedure? °After your procedure, it is typical to have the following: °· Bruising at the radial site that usually fades within 1-2 weeks. °· Blood collecting in the tissue (hematoma) that may be painful to the touch. It should usually decrease in size and tenderness within 1-2 weeks. ° °Follow these instructions at home: °· Take medicines only as directed by your health care provider. °· You may shower 24-48 hours after the procedure or as directed by your health care provider. Remove the bandage (dressing) and gently wash the site with plain soap and water. Pat the area dry with a clean towel. Do not rub the site, because this may cause bleeding. °· Do not take baths, swim, or use a hot tub until your health care provider approves. °· Check your insertion site every day for redness, swelling, or drainage. °· Do not apply powder or lotion to the site. °· Do not flex or bend the affected arm for 24 hours or as directed by your health care provider. °· Do not push or pull heavy objects with the affected arm for 24 hours or as directed by your health care provider. °· Do not lift over 10 lb (4.5 kg) for 5 days after your procedure or as directed by your health care provider. °· Ask your health care provider when it is okay to: °? Return to work or school. °? Resume usual physical activities or sports. °? Resume sexual activity. °· Do not drive home if you are discharged the  same day as the procedure. Have someone else drive you. °· You may drive 24 hours after the procedure unless otherwise instructed by your health care provider. °· Do not operate machinery or power tools for 24 hours after the procedure. °· If your procedure was done as an outpatient procedure, which means that you went home the same day as your procedure, a responsible adult should be with you for the first 24 hours after you arrive home. °· Keep all follow-up visits as directed by your health care provider. This is important. °Contact a health care provider if: °· You have a fever. °· You have chills. °· You have increased bleeding from the radial site. Hold pressure on the site. °Get help right away if: °· You have unusual pain at the radial site. °· You have redness, warmth, or swelling at the radial site. °· You have drainage (other than a small amount of blood on the dressing) from the radial site. °· The radial site is bleeding, and the bleeding does not stop after 30 minutes of holding steady pressure on the site. °· Your arm or hand becomes pale, cool, tingly, or numb. °This information is not intended to replace advice given to you by your health care provider. Make sure you discuss any questions you have with your health care provider. °Document Released: 05/31/2010 Document Revised: 10/04/2015 Document Reviewed: 11/14/2013 °Elsevier Interactive Patient Education © 2018 [employer] ° °

## 2017-10-15 NOTE — Interval H&P Note (Signed)
History and Physical Interval Note:  10/15/2017 9:42 AM  [name]  has presented today for surgery, with the diagnosis of cp  The various methods of treatment have been discussed with the patient and family. After consideration of risks, benefits and other options for treatment, the patient has consented to  Procedure(s): LEFT HEART CATH AND CORONARY ANGIOGRAPHY (N/A) as a surgical intervention .  The patient's history has been reviewed, patient examined, no change in status, stable for surgery.  I have reviewed the patient's chart and labs.  Questions were answered to the patient's satisfaction.   Cath Lab Visit (complete for each Cath Lab visit)  Clinical Evaluation Leading to the Procedure:   ACS: No.  Non-ACS:    Anginal Classification: CCS III  Anti-ischemic medical therapy: Maximal Therapy (2 or more classes of medications)  Non-Invasive Test Results: No non-invasive testing performed  Prior CABG: No previous CABG        Collier [facility] 10/15/2017 9:43 AM

## 2017-10-19 ENCOUNTER — Other Ambulatory Visit: Payer: Self-pay | Admitting: *Deleted

## 2017-10-19 MED ORDER — NITROGLYCERIN 0.4 MG SL SUBL: SUBLINGUAL_TABLET | SUBLINGUAL | 3 refills | Status: DC

## 2017-10-19 MED FILL — Heparin Sod (Porcine)-NaCl IV Soln 1000 Unit/500ML-0.9%: INTRAVENOUS | Qty: 1000 | Status: AC

## 2017-10-23 ENCOUNTER — Encounter: Payer: Self-pay | Admitting: Cardiovascular Disease

## 2017-11-03 DIAGNOSIS — M545 Low back pain: Secondary | ICD-10-CM | POA: Diagnosis not present

## 2017-11-03 DIAGNOSIS — Z79891 Long term (current) use of opiate analgesic: Secondary | ICD-10-CM | POA: Diagnosis not present

## 2017-11-03 DIAGNOSIS — G4459 Other complicated headache syndrome: Secondary | ICD-10-CM | POA: Diagnosis not present

## 2017-11-03 DIAGNOSIS — M5416 Radiculopathy, lumbar region: Secondary | ICD-10-CM | POA: Diagnosis not present

## 2017-11-04 ENCOUNTER — Encounter: Payer: Self-pay | Admitting: *Deleted

## 2017-11-04 ENCOUNTER — Ambulatory Visit (INDEPENDENT_AMBULATORY_CARE_PROVIDER_SITE_OTHER): Payer: Medicare Other | Admitting: Cardiovascular Disease

## 2017-11-04 VITALS — BP 118/68 | HR 62 | Ht 68.5 in | Wt 259.0 lb

## 2017-11-04 DIAGNOSIS — I10 Essential (primary) hypertension: Secondary | ICD-10-CM

## 2017-11-04 DIAGNOSIS — I25118 Atherosclerotic heart disease of native coronary artery with other forms of angina pectoris: Secondary | ICD-10-CM

## 2017-11-04 DIAGNOSIS — I5032 Chronic diastolic (congestive) heart failure: Secondary | ICD-10-CM

## 2017-11-04 DIAGNOSIS — Z955 Presence of coronary angioplasty implant and graft: Secondary | ICD-10-CM

## 2017-11-04 DIAGNOSIS — I209 Angina pectoris, unspecified: Secondary | ICD-10-CM | POA: Diagnosis not present

## 2017-11-04 DIAGNOSIS — E785 Hyperlipidemia, unspecified: Secondary | ICD-10-CM | POA: Diagnosis not present

## 2017-11-04 MED ORDER — METOPROLOL SUCCINATE ER 50 MG PO TB24: 50.0000 mg | ORAL_TABLET | Freq: Every day | ORAL | 3 refills | Status: DC

## 2017-11-04 NOTE — Progress Notes (Signed)
SUBJECTIVE: The patient presents for follow-up after undergoing coronary angiography.  There were no significant changes from his previous coronary angiogram in August 2016.  Medical therapy was recommended.  Previously placed proximal LAD to mid LAD stent was widely patent.  There is an acute marginal lesion which had an 80% stenosis, 70% stenosis of an ostial first diagonal lesion, and 50% stenosis of a ostial second diagonal branch.  LVEDP was normal as was left ventricular ejection fraction, 55 to 65% by visual estimate.  He has a history of anxiety with panic attacks and claustrophobia, along with chronic pain for which he takes Dilaudid and Motrin.  He has fibromyalgia with diffuse pain.  Shortly after he saw me he saw his PCP, Dr. Nadara [name].  He apparently had very low testosterone levels.  His PCP double the dose of testosterone and the patient's energy levels have markedly improved.  He denies chest pain and has not had use nitroglycerin.  I reviewed CBC and basic metabolic panel dated 0/05/930 and both were normal.      Review of Systems: As per "subjective", otherwise negative.  Allergies  Allergen Reactions  . Bee Venom Anaphylaxis  . Duloxetine Swelling    CHF  . Fentanyl Other (See Comments)    Confusion, panic attacks with the patch,  Fentanyl injected is ok  . Lidocaine Other (See Comments)    REACTION: non-reactive  . Meperidine Hcl Other (See Comments)    REACTION: causes extreme mental reactions.  . Methadone Other (See Comments)    REACTION: causes heart failure  . Oxycodone Hcl Other (See Comments)    REACTION: causes heart failure  . Pregabalin Other (See Comments)    REACTION: chf  . Zolpidem Tartrate Other (See Comments)    REACTION: confusion    Current Outpatient Medications  Medication Sig Dispense Refill  . aspirin EC 81 MG tablet Take 81 mg by mouth at bedtime.     Marland Kitchen atorvastatin (LIPITOR) 40 MG tablet TAKE 1 TABLET EVERY DAY (Patient taking  differently: Take 40 mg by mouth once daily in the evening) 90 tablet 3  . b complex vitamins tablet Take 1 tablet by mouth daily.    . betamethasone valerate ointment (VALISONE) 0.1 % Apply 1 application topically 2 (two) times daily as needed (itching).    . Butalbital-APAP-Caffeine 50-325-40 MG capsule Take 1 capsule by mouth 3 (three) times daily as needed for headache.     . Cholecalciferol (VITAMIN D) 2000 units tablet Take 2,000 Units by mouth daily.    . citalopram (CELEXA) 20 MG tablet Take 20 mg by mouth daily.     . clonazePAM (KLONOPIN) 0.5 MG tablet Take 0.5 mg by mouth 2 (two) times daily as needed for anxiety.     . clopidogrel (PLAVIX) 75 MG tablet TAKE 1 TABLET DAILY WITH BREAKFAST. (NEED MD APPT) 90 tablet 1  . diphenhydrAMINE (BENADRYL) 25 MG tablet Take 25-50 mg by mouth 2 (two) times daily as needed for itching.     . fluticasone (CUTIVATE) 0.05 % cream Apply 1 application topically 2 (two) times daily as needed (rash). Applied to face and/or penis area(s)    . furosemide (LASIX) 40 MG tablet Take 120 mg by mouth daily.    Marland Kitchen gabapentin (NEURONTIN) 600 MG tablet Take 1,800 mg by mouth 2 (two) times daily.     Marland Kitchen guaifenesin (HUMIBID E) 400 MG TABS tablet Take 800 mg by mouth daily as needed (for post nasal drip/cough).     Marland Kitchen  HYDROmorphone (DILAUDID) 4 MG tablet Take 4 mg by mouth 4 (four) times daily as needed for severe pain.     Marland Kitchen ibuprofen (ADVIL,MOTRIN) 200 MG tablet Take 600 mg by mouth 4 (four) times daily as needed for mild pain or moderate pain.     . isosorbide mononitrate (IMDUR) 60 MG 24 hr tablet Take 1.5 tablets (90 mg total) by mouth daily. (Patient taking differently: Take 60 mg by mouth daily. ) 135 tablet 3  . L-Lysine 500 MG TABS Take 500 mg by mouth daily.     Marland Kitchen loperamide (IMODIUM A-D) 2 MG tablet Take 6 mg by mouth 4 (four) times daily as needed for diarrhea or loose stools.    . metoprolol succinate (TOPROL-XL) 100 MG 24 hr tablet Take 100 mg by mouth daily.  Take with or immediately following a meal.    . Multiple Vitamins-Minerals (MULTIVITAMIN WITH MINERALS) tablet Take 1 tablet by mouth every evening.     . nitroGLYCERIN (NITROSTAT) 0.4 MG SL tablet DISSOLVE ONE TABLET UNDER THE TONGUE EVERY 5 MINUTES AS NEEDED FOR CHEST PAIN.  DO NOT EXCEED A TOTAL OF 3 DOSES IN 15 MINUTES 25 tablet 3  . NON FORMULARY at bedtime. CPAP     . omeprazole (PRILOSEC) 40 MG capsule Take 40 mg by mouth daily before breakfast.     . polyethylene glycol (MIRALAX / GLYCOLAX) packet Take 17 g by mouth daily as needed for moderate constipation.    . polyvinyl alcohol-povidone (HYPOTEARS) 1.4-0.6 % ophthalmic solution Place 1-2 drops into both eyes daily as needed (dry eyes). For dry eyes    . potassium chloride (K-DUR) 10 MEQ tablet Take 10-20 mEq by mouth See admin instructions. 2 tablets in AM and 1 tablet in PM    . testosterone cypionate (DEPOTESTOSTERONE CYPIONATE) 200 MG/ML injection Inject 100 mg into the muscle every 14 (fourteen) days.     Marland Kitchen topiramate (TOPAMAX) 25 MG tablet Take 75 mg by mouth at bedtime.    . valACYclovir (VALTREX) 500 MG tablet Take 500 mg by mouth 2 (two) times daily as needed (fever blisters).     . vitamin C (ASCORBIC ACID) 500 MG tablet Take 500 mg by mouth every morning.      No current facility-administered medications for this visit.     Past Medical History:  Diagnosis Date  . Anxiety    HX PANIC ATTACKS  . CHF (congestive heart failure) ([place])   . Chronic cough   . Coronary artery disease    STENT X 1 2010 Mid LAD  . Dry eyes   . Fibromyalgia   . Fluid retention   . GERD (gastroesophageal reflux disease)   . Headache(784.0)   . Herpes genitalis in men   . History of kidney stones   . History of vertigo   . Hypertension   . Narcolepsy   . Neuropathy   . Penile lesion   . Sjogren's disease ([place])   . Sleep apnea    USES C-PAP    Past Surgical History:  Procedure Laterality Date  . BREAST SURGERY     BIL MASTECTOMY   FOR BENIGN LUMPS  . CARDIAC CATHETERIZATION N/A 01/09/2015   Procedure: Left Heart Cath and Coronary Angiography;  Surgeon: Leonie Man, MD;  Location: [facility];  Service: Cardiovascular;  Laterality: N/A;  . CARPAL TUNNELL  BIL  . CIRCUMCISION    . CORONARY ANGIOPLASTY WITH STENT PLACEMENT  X1 2010  . HERNIA REPAIR  ING HERNIA REPAIR  . LEFT HEART CATH AND CORONARY ANGIOGRAPHY N/A 10/15/2017   Procedure: LEFT HEART CATH AND CORONARY ANGIOGRAPHY;  Surgeon: Martinique, Peter M, MD;  Location: [facility];  Service: Cardiovascular;  Laterality: N/A;  . LEFT HEART CATHETERIZATION WITH CORONARY ANGIOGRAM N/A 10/11/2013   Procedure: LEFT HEART CATHETERIZATION WITH CORONARY ANGIOGRAM;  Surgeon: Sinclair Grooms, MD;  Location: [facility] CATH LAB;  Service: Cardiovascular;  Laterality: N/A;  . LEFT HEART CATHETERIZATION WITH CORONARY ANGIOGRAM N/A 06/21/2014   Procedure: LEFT HEART CATHETERIZATION WITH CORONARY ANGIOGRAM;  Surgeon: Troy Sine, MD;  Location: [facility] CATH LAB;  Service: Cardiovascular;  Laterality: N/A;  . MEATOTOMY  X2  . PERCUTANEOUS CORONARY STENT INTERVENTION (PCI-S) N/A 06/23/2014   Procedure: PERCUTANEOUS CORONARY STENT INTERVENTION (PCI-S);  Surgeon: Jettie Booze, MD;  Location: [facility] CATH LAB;  Service: Cardiovascular;  Laterality: N/A;  . VASECTOMY    . WRIST FRACTURE SURGERY  L  X3 SURGERIES    Social History   Socioeconomic History  . Marital status: Married    Spouse name: Not on file  . Number of children: Not on file  . Years of education: Not on file  . Highest education level: Not on file  Occupational History  . Not on file  Social Needs  . Financial resource strain: Not on file  . Food insecurity:    Worry: Not on file    Inability: Not on file  . Transportation needs:    Medical: Not on file    Non-medical: Not on file  Tobacco Use  . Smoking status: Former Smoker    Packs/day: 2.50    Years: 32.00    Pack years: 80.00    Types:  Cigarettes    Start date: 04/17/1967    Last attempt to quit: 05/12/2000    Years since quitting: 17.4  . Smokeless tobacco: Never Used  Substance and Sexual Activity  . 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 Drug use: No  . Sexual activity: Not on file  Lifestyle  . Physical activity:    Days per week: Not on file    Minutes per session: Not on file  . Stress: Not on file  Relationships  . Social connections:    Talks on phone: Not on file    Gets together: Not on file    Attends religious service: Not on file    Active member of club or organization: Not on file    Attends meetings of clubs or organizations: Not on file    Relationship status: Not on file  . Intimate partner violence:    Fear of current or ex partner: Not on file    Emotionally abused: Not on file    Physically abused: Not on file    Forced sexual activity: Not on file  Other Topics Concern  . Not on file  Social History Narrative  . Not on file     Vitals:   11/04/17 1401  BP: 118/68  Pulse: 62  SpO2: 93%  Weight: 259 lb (117.5 kg)  Height: 5' 8.5" (1.74 m)    Wt Readings from Last 3 Encounters:  11/04/17 259 lb (117.5 kg)  10/15/17 243 lb (110.2 kg)  10/09/17 253 lb 9.6 oz (115 kg)     PHYSICAL EXAM General: NAD HEENT: Normal. Neck: No JVD, no thyromegaly. Lungs: Clear to auscultation bilaterally with normal respiratory effort.  CV: Regular rate and rhythm, normal S1/S2, no S3/S4, no murmur. No pretibial or periankle edema. Abdomen: Soft, nontender, no distention.  Neurologic: Alert and oriented.  Psych: Normal affect. Skin: Normal. Musculoskeletal: No gross deformities.    ECG: Reviewed above under Subjective   Labs: Lab Results  Component Value Date/Time   K 3.9 10/15/2017 06:23 AM   BUN 13 10/15/2017 06:23 AM   CREATININE 1.14 10/15/2017 06:23 AM   ALT 21 07/30/2011 09:18 PM   HGB 15.2 10/15/2017 06:23 AM      Lipids: Lab Results  Component Value Date/Time   LDLCALC 100 (H) 10/11/2013 03:57 AM   CHOL 168 10/11/2013 03:57 AM   TRIG 172 (H) 10/11/2013 03:57 AM   HDL 34 (L) 10/11/2013 03:57 AM       ASSESSMENT AND PLAN: 1.  Coronary artery disease: Coronary angiogram reviewed above.  No significant changes from 2016.  I will have him take aspirin, Lipitor, Imdur 610 mg, and Toprol-XL (I will reduce the dose to 50 mg daily).  I will discontinue Plavix.  2.  Hypertension: Controlled on present therapy.  I will monitor given reduction of metoprolol dose as noted above.  3.  Hyperlipidemia: Continue Lipitor 40 mg.  Most recent lipids reviewed.  4.  Chronic diastolic heart failure: Compensated on Lasix 40 mg 3 times daily.  He takes supplemental potassium as well.  No changes.    Disposition: Follow up 6 months   Kate Sable, M.D., F.A.C.C.

## 2017-11-04 NOTE — Patient Instructions (Signed)
Medication Instructions:  STOP PLAVIX  DECREASE TOPROL XL 50 MG DAILY   Labwork: NONE    Follow-Up: Your physician wants you to follow-up in: 6 MONTHS.  You will receive a reminder letter in the mail two months in advance. If you don't receive a letter, please call our office to schedule the follow-up appointment.   Any Other Special Instructions Will Be Listed Below (If Applicable).     If you need a refill on your cardiac medications before your next appointment, please call your pharmacy.

## 2017-11-09 ENCOUNTER — Ambulatory Visit: Payer: Medicare Other | Admitting: Cardiovascular Disease

## 2017-11-20 DIAGNOSIS — M545 Low back pain: Secondary | ICD-10-CM | POA: Diagnosis not present

## 2017-11-20 DIAGNOSIS — M5416 Radiculopathy, lumbar region: Secondary | ICD-10-CM | POA: Diagnosis not present

## 2017-11-23 DIAGNOSIS — M25512 Pain in left shoulder: Secondary | ICD-10-CM | POA: Diagnosis not present

## 2017-11-23 DIAGNOSIS — M792 Neuralgia and neuritis, unspecified: Secondary | ICD-10-CM | POA: Diagnosis not present

## 2017-11-23 DIAGNOSIS — M75122 Complete rotator cuff tear or rupture of left shoulder, not specified as traumatic: Secondary | ICD-10-CM | POA: Diagnosis not present

## 2017-12-02 ENCOUNTER — Other Ambulatory Visit: Payer: Self-pay | Admitting: Cardiovascular Disease

## 2017-12-10 ENCOUNTER — Other Ambulatory Visit: Payer: Self-pay

## 2017-12-25 IMAGING — CT CT HEAD W/O CM
3 series · 15 of 47 positions shown, 18 images · non-contrast
Comparison: 03/31/2016

CLINICAL DATA: Fall 1 month ago with persistent visual disturbance

EXAM:
CT HEAD WITHOUT CONTRAST
TECHNIQUE: Contiguous axial images were obtained from the base of the skull
through the vertex without intravenous contrast.

[Series 2: head wo · axial · 0.46mm/px · z∈[+32,+167]mm · 9 of 33 slices shown, 12 images]
[im 3/33  brain]
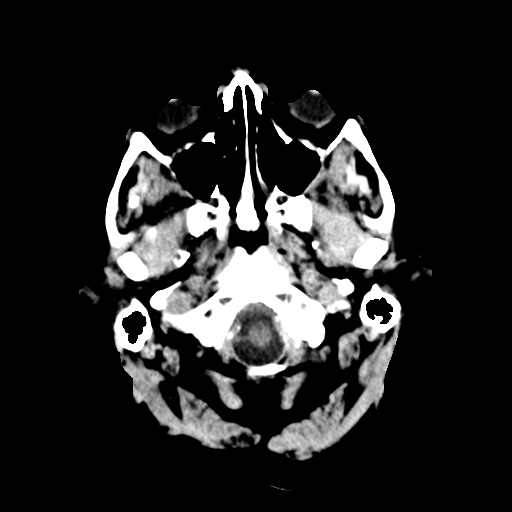
[im 3/33  bone]
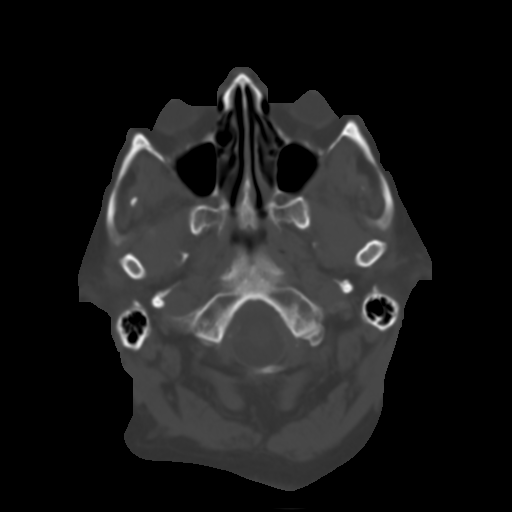
[im 6/33  brain]
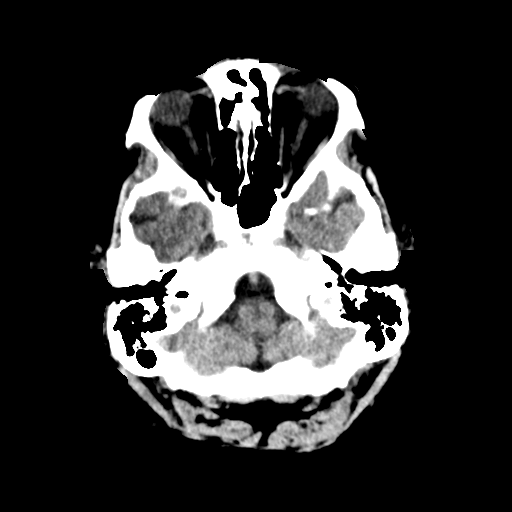
[im 9/33  brain]
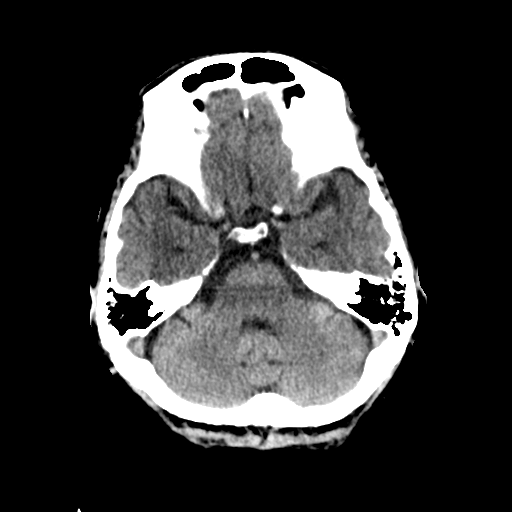
[im 13/33  brain]
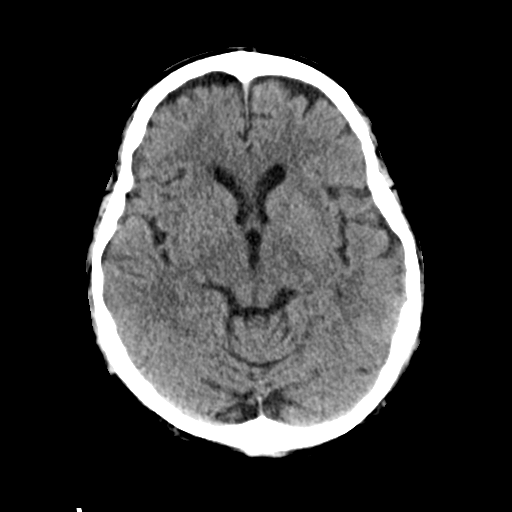
[im 17/33  brain]
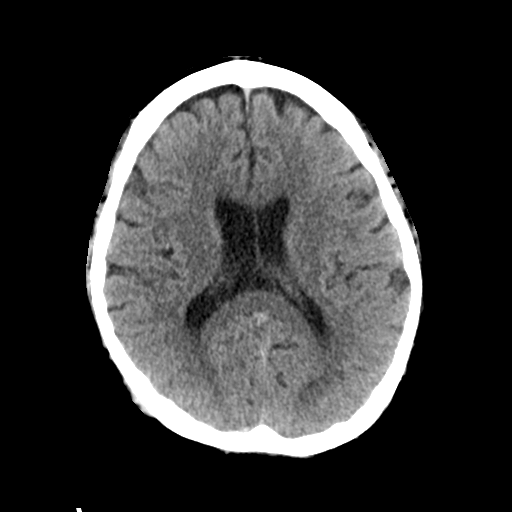
[im 17/33  bone]
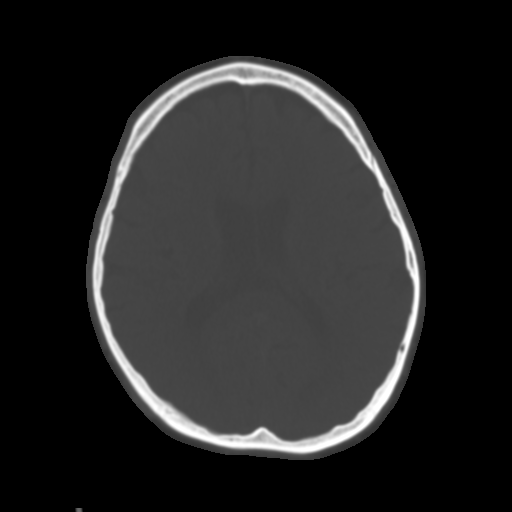
[im 20/33  brain]
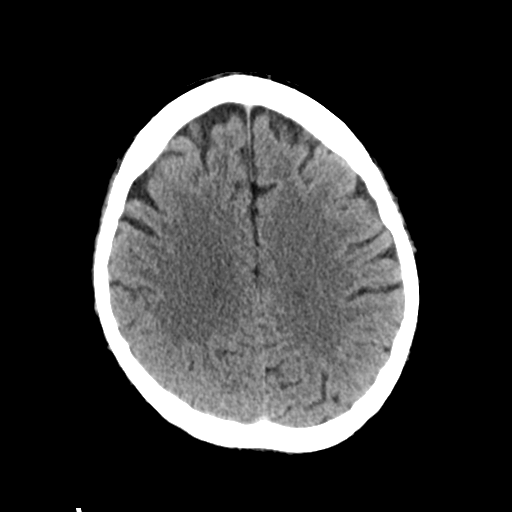
[im 24/33  brain]
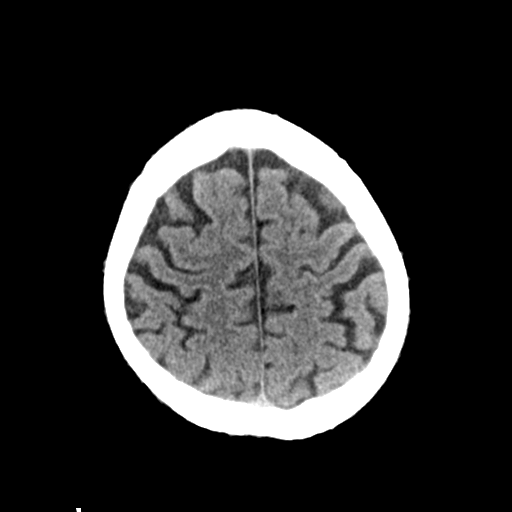
[im 27/33  brain]
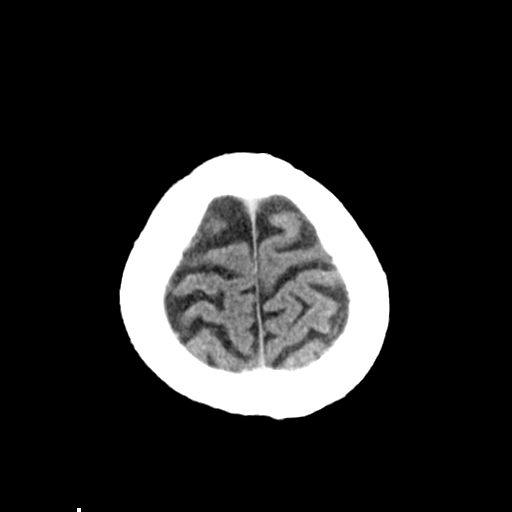
[im 30/33  brain]
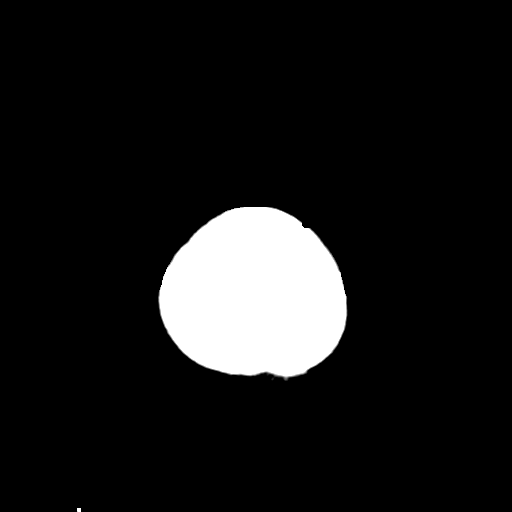
[im 30/33  bone]
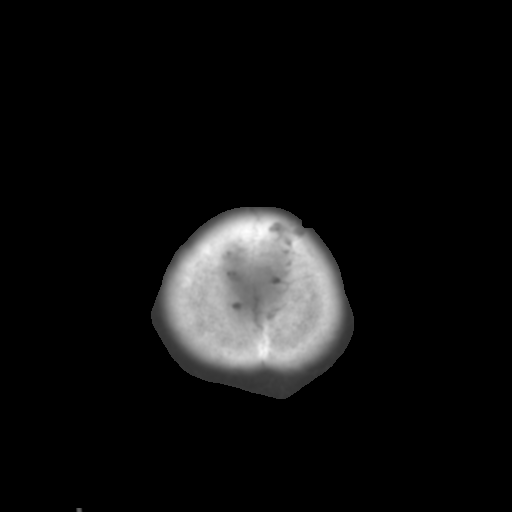

[Series 4: coronal soft tissue · coronal · 0.31mm/px · 3 of 71 slices shown]
[im 24/71  brain]
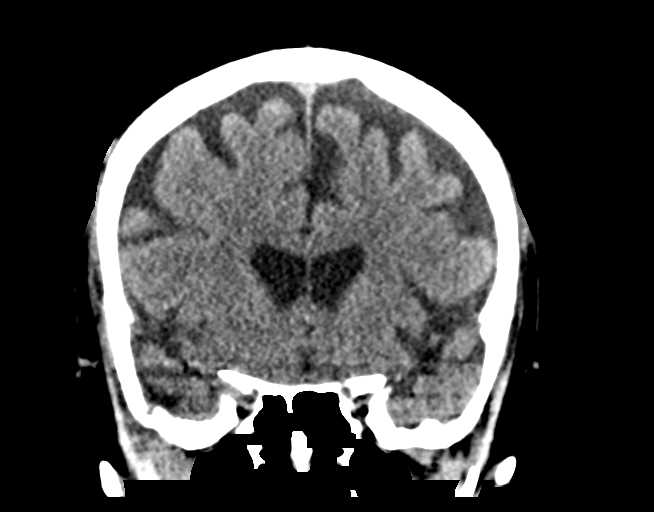
[im 32/71  brain]
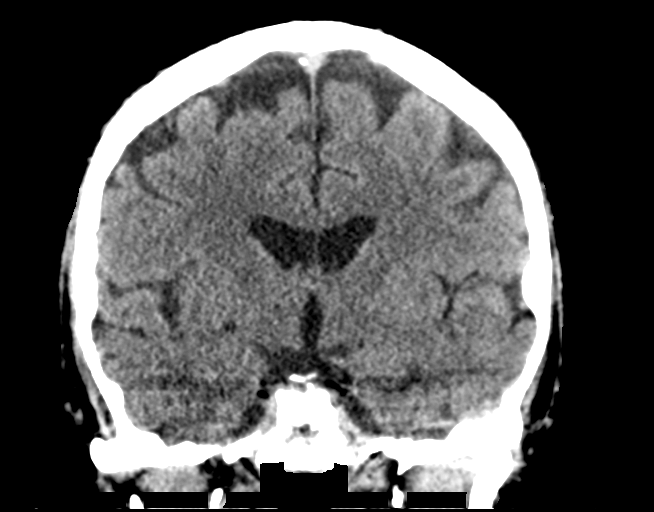
[im 39/71  brain]
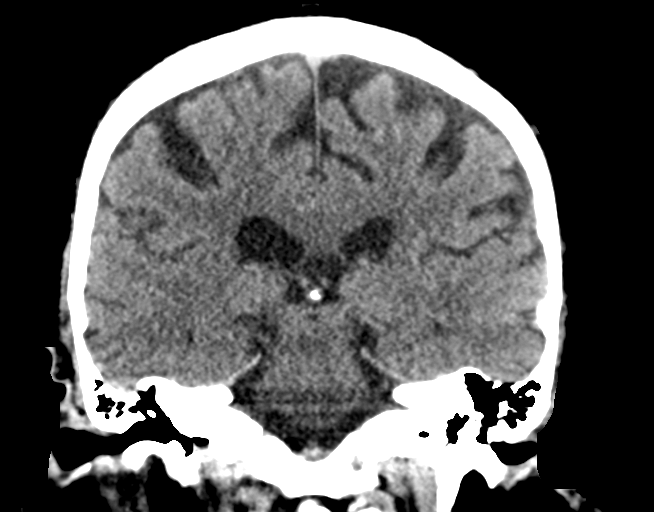

[Series 5: sagittal soft tissue · sagittal · 0.33mm/px · 3 of 62 slices shown]
[im 21/62  brain]
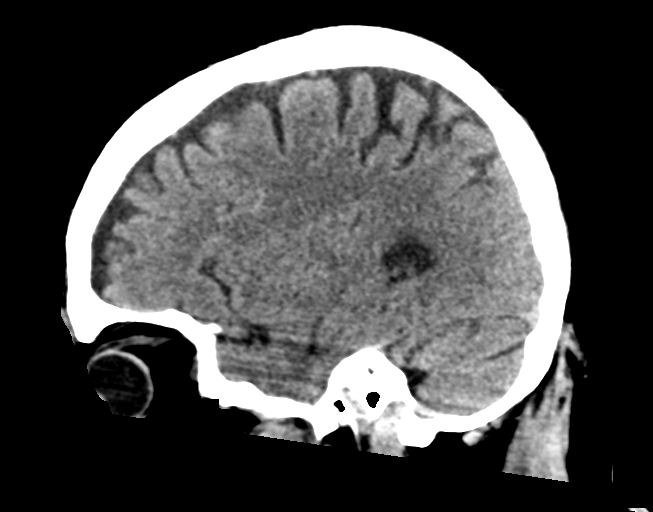
[im 31/62  brain]
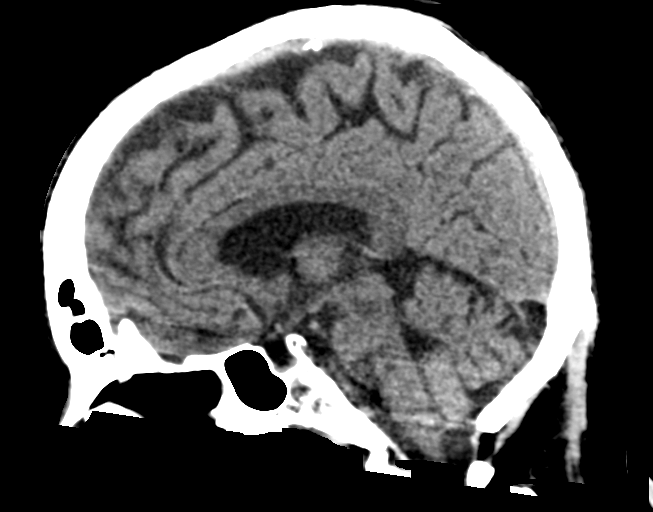
[im 41/62  brain]
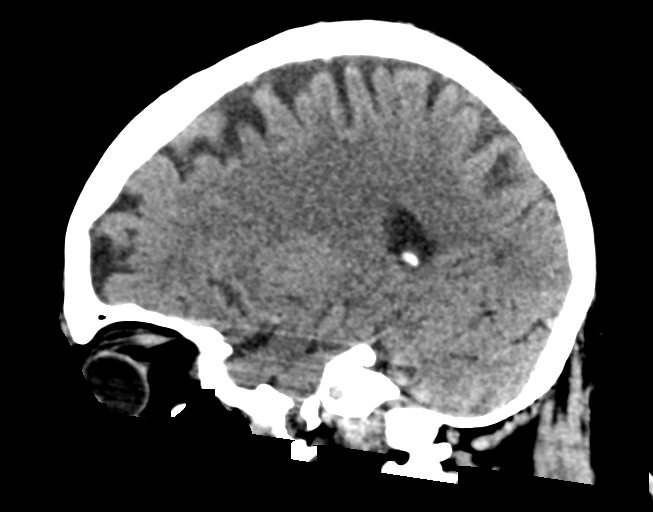

[15 of 47 positions shown; findings below may reference images not displayed]

FINDINGS: Brain: No evidence of acute infarction, hemorrhage, hydrocephalus,
extra-axial collection or mass lesion/mass effect.

Vascular: No hyperdense vessel or unexpected calcification.

Skull: Normal. Negative for fracture or focal lesion.

Sinuses/Orbits: No acute finding.

Other: None.
IMPRESSION: No acute intracranial abnormality noted.

## 2018-01-01 DIAGNOSIS — D179 Benign lipomatous neoplasm, unspecified: Secondary | ICD-10-CM | POA: Diagnosis not present

## 2018-01-01 DIAGNOSIS — Z6836 Body mass index (BMI) 36.0-36.9, adult: Secondary | ICD-10-CM | POA: Diagnosis not present

## 2018-01-12 DIAGNOSIS — E291 Testicular hypofunction: Secondary | ICD-10-CM | POA: Diagnosis not present

## 2018-01-15 DIAGNOSIS — Z23 Encounter for immunization: Secondary | ICD-10-CM | POA: Diagnosis not present

## 2018-02-04 DIAGNOSIS — Z79891 Long term (current) use of opiate analgesic: Secondary | ICD-10-CM | POA: Diagnosis not present

## 2018-02-04 DIAGNOSIS — G894 Chronic pain syndrome: Secondary | ICD-10-CM | POA: Diagnosis not present

## 2018-02-04 DIAGNOSIS — G4459 Other complicated headache syndrome: Secondary | ICD-10-CM | POA: Diagnosis not present

## 2018-02-04 DIAGNOSIS — M545 Low back pain: Secondary | ICD-10-CM | POA: Diagnosis not present

## 2018-02-04 DIAGNOSIS — M5416 Radiculopathy, lumbar region: Secondary | ICD-10-CM | POA: Diagnosis not present

## 2018-02-11 DIAGNOSIS — J22 Unspecified acute lower respiratory infection: Secondary | ICD-10-CM | POA: Diagnosis not present

## 2018-02-11 DIAGNOSIS — R05 Cough: Secondary | ICD-10-CM | POA: Diagnosis not present

## 2018-02-19 DIAGNOSIS — M545 Low back pain: Secondary | ICD-10-CM | POA: Diagnosis not present

## 2018-02-19 DIAGNOSIS — M5416 Radiculopathy, lumbar region: Secondary | ICD-10-CM | POA: Diagnosis not present

## 2018-04-07 DIAGNOSIS — M25512 Pain in left shoulder: Secondary | ICD-10-CM | POA: Diagnosis not present

## 2018-04-07 DIAGNOSIS — M75122 Complete rotator cuff tear or rupture of left shoulder, not specified as traumatic: Secondary | ICD-10-CM | POA: Diagnosis not present

## 2018-04-12 DIAGNOSIS — Z6836 Body mass index (BMI) 36.0-36.9, adult: Secondary | ICD-10-CM | POA: Diagnosis not present

## 2018-04-12 DIAGNOSIS — R947 Abnormal results of other endocrine function studies: Secondary | ICD-10-CM | POA: Diagnosis not present

## 2018-04-12 DIAGNOSIS — L719 Rosacea, unspecified: Secondary | ICD-10-CM | POA: Diagnosis not present

## 2018-04-12 DIAGNOSIS — N481 Balanitis: Secondary | ICD-10-CM | POA: Diagnosis not present

## 2018-05-10 DIAGNOSIS — M79606 Pain in leg, unspecified: Secondary | ICD-10-CM | POA: Diagnosis not present

## 2018-05-10 DIAGNOSIS — G4459 Other complicated headache syndrome: Secondary | ICD-10-CM | POA: Diagnosis not present

## 2018-05-10 DIAGNOSIS — Z79891 Long term (current) use of opiate analgesic: Secondary | ICD-10-CM | POA: Diagnosis not present

## 2018-05-10 DIAGNOSIS — M545 Low back pain: Secondary | ICD-10-CM | POA: Diagnosis not present

## 2018-06-14 ENCOUNTER — Ambulatory Visit: Payer: Medicare Other | Admitting: Cardiovascular Disease

## 2018-07-29 ENCOUNTER — Other Ambulatory Visit: Payer: Self-pay | Admitting: Cardiovascular Disease

## 2018-07-29 ENCOUNTER — Ambulatory Visit: Payer: 59 | Admitting: Cardiovascular Disease

## 2018-08-18 ENCOUNTER — Other Ambulatory Visit: Payer: Self-pay | Admitting: Cardiovascular Disease

## 2018-09-17 ENCOUNTER — Ambulatory Visit: Payer: 59 | Admitting: Cardiovascular Disease

## 2018-09-24 DIAGNOSIS — M797 Fibromyalgia: Secondary | ICD-10-CM | POA: Diagnosis not present

## 2018-09-24 DIAGNOSIS — D509 Iron deficiency anemia, unspecified: Secondary | ICD-10-CM | POA: Diagnosis not present

## 2018-09-24 DIAGNOSIS — D519 Vitamin B12 deficiency anemia, unspecified: Secondary | ICD-10-CM | POA: Diagnosis not present

## 2018-09-24 DIAGNOSIS — J449 Chronic obstructive pulmonary disease, unspecified: Secondary | ICD-10-CM | POA: Diagnosis not present

## 2018-09-24 DIAGNOSIS — E291 Testicular hypofunction: Secondary | ICD-10-CM | POA: Diagnosis not present

## 2018-09-24 DIAGNOSIS — I10 Essential (primary) hypertension: Secondary | ICD-10-CM | POA: Diagnosis not present

## 2018-09-27 ENCOUNTER — Telehealth: Payer: Self-pay | Admitting: Cardiology

## 2018-09-27 NOTE — Telephone Encounter (Signed)
Virtual Visit Pre-Appointment Phone Call  "(Name), I am calling you today to discuss your upcoming appointment. We are currently trying to limit exposure to the virus that causes COVID-19 by seeing patients at home rather than in the office."  1. "What is the BEST phone number to call the day of the visit?" - include this in appointment notes  2. Do you have or have access to (through a family member/friend) a smartphone with video capability that we can use for your visit?" a. If yes - list this number in appt notes as cell (if different from BEST phone #) and list the appointment type as a VIDEO visit in appointment notes b. If no - list the appointment type as a PHONE visit in appointment notes  3. Confirm consent - "In the setting of the current Covid19 crisis, you are scheduled for a (phone or video) visit with your provider on (date) at (time).  Just as we do with many in-office visits, in order for you to participate in this visit, we must obtain consent.  If you'd like, I can send this to your mychart (if signed up) or email for you to review.  Otherwise, I can obtain your verbal consent now.  All virtual visits are billed to your insurance company just like a normal visit would be.  By agreeing to a virtual visit, we'd like you to understand that the technology does not allow for your provider to perform an examination, and thus may limit your provider's ability to fully assess your condition. If your provider identifies any concerns that need to be evaluated in person, we will make arrangements to do so.  Finally, though the technology is pretty good, we cannot assure that it will always work on either your or our end, and in the setting of a video visit, we may have to convert it to a phone-only visit.  In either situation, we cannot ensure that we have a secure connection.  Are you willing to proceed?" STAFF: Did the patient verbally acknowledge consent to telehealth visit? Document  YES/NO here: Yes  4. Advise patient to be prepared - "Two hours prior to your appointment, go ahead and check your blood pressure, pulse, oxygen saturation, and your weight (if you have the equipment to check those) and write them all down. When your visit starts, your provider will ask you for this information. If you have an Apple Watch or Kardia device, please plan to have heart rate information ready on the day of your appointment. Please have a pen and paper handy nearby the day of the visit as well."  5. Give patient instructions for MyChart download to smartphone OR [email or web address] as below if video visit (depending on what platform provider is using)  6. Inform patient they will receive a phone call 15 minutes prior to their appointment time (may be from unknown caller ID) so they should be prepared to answer    [name] has been deemed a candidate for a follow-up tele-health visit to limit community exposure during the Covid-19 pandemic. I spoke with the patient via phone to ensure availability of phone/video source, confirm preferred email & phone number, and discuss instructions and expectations.  I reminded [name] to be prepared with any vital sign and/or heart rhythm information that could potentially be obtained via home monitoring, at the time of his visit. I reminded [name] to expect a phone call prior to  his visit.  Orinda Kenner 09/27/2018 4:31 PM

## 2018-10-01 ENCOUNTER — Encounter: Payer: Self-pay | Admitting: Cardiology

## 2018-10-01 ENCOUNTER — Telehealth (INDEPENDENT_AMBULATORY_CARE_PROVIDER_SITE_OTHER): Payer: Medicare PPO | Admitting: Cardiology

## 2018-10-01 VITALS — BP 124/70 | HR 64 | Ht 68.0 in | Wt 247.0 lb

## 2018-10-01 DIAGNOSIS — I10 Essential (primary) hypertension: Secondary | ICD-10-CM

## 2018-10-01 DIAGNOSIS — I2510 Atherosclerotic heart disease of native coronary artery without angina pectoris: Secondary | ICD-10-CM | POA: Diagnosis not present

## 2018-10-01 DIAGNOSIS — E785 Hyperlipidemia, unspecified: Secondary | ICD-10-CM

## 2018-10-01 DIAGNOSIS — Z9861 Coronary angioplasty status: Secondary | ICD-10-CM

## 2018-10-01 DIAGNOSIS — G4733 Obstructive sleep apnea (adult) (pediatric): Secondary | ICD-10-CM

## 2018-10-01 NOTE — Progress Notes (Signed)
Virtual Visit via Video Note   This visit type was conducted due to national recommendations for restrictions regarding the COVID-19 Pandemic (e.g. social distancing) in an effort to limit this patient's exposure and mitigate transmission in our community.  Due to his co-morbid illnesses, this patient is at least at moderate risk for complications without adequate follow up.  This format is felt to be most appropriate for this patient at this time.  All issues noted in this document were discussed and addressed.  A limited physical exam was performed with this format.  Please refer to the patient's chart for his consent to telehealth for [facility].   Date:  10/01/2018   ID:  [name], DOB 12-26-1950, MRN [id number]  Patient Location: Home Provider Location: Home  PCP:  Rory Percy, MD  Cardiologist:  Kate Sable, MD  Electrophysiologist:  None   Evaluation Performed:  Follow-Up Visit  Chief Complaint:  Chest pain  History of Present Illness:    [name] is a 68 y.o. male with a history of coronary disease.  He had a remote LAD stent I believe in 2010.  In June 2015 he presented with an anterior MI.  The LAD was now occluded but there were collaterals.  He continued to have chest pain and in February 2016 he had a catheterization and ultimately had an intervention to his previously occluded LAD as well as a angioplasty to a diagonal branch.  Catheterization done in August 2016 and again in June 2019 showed the sites were patent.  He does have residual 80% acute marginal branch and a 70% diagonal narrowing.  His LV function has been normal.  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  He was last in the cardiology office in June 2019.  He is due to have a routine one-year follow-up in July 2020.  About 3 weeks ago he injured his big toe.   It actually tore the nail off.  He decided to treat this himself with superglue.  The toe subsequently became infected.  He was placed on antibiotics.  He tells me a week or so ago he had chest pain swelling in his affected leg and shortness of breath.  He saw his primary care provider in the office 4 days ago.  Antibiotics were changed.  The patient actually says he is doing much better now and he has not had chest pain or shortness of breath.  His primary care provider apparently did consider possible DVT and PE but according to the patient it was not felt this required further work-up.  Dr. Nadara Mustard did feel though that the patient should be seen by cardiology.  According to the patient an EKG was done and did not show any acute changes.  The patient tells me he will require a toe amputation as the infection is now involving the great toe joint.  This will be done next week at [facility].  The patient does not have symptoms concerning for COVID-19 infection (fever, chills, cough, or new shortness of breath).    Past Medical History:  Diagnosis Date  . Anxiety    HX PANIC ATTACKS  . CHF (congestive heart failure) ([place])   . Chronic cough   . Coronary artery disease    STENT X 1 2010 Mid LAD  . Dry eyes   . Fibromyalgia   . Fluid retention   .  GERD (gastroesophageal reflux disease)   . Headache(784.0)   . Herpes genitalis in men   . History of kidney stones   . History of vertigo   . Hypertension   . Narcolepsy   . Neuropathy   . Penile lesion   . Sjogren's disease ([place])   . Sleep apnea    USES C-PAP   Past Surgical History:  Procedure Laterality Date  . BREAST SURGERY     BIL MASTECTOMY  FOR BENIGN LUMPS  . CARDIAC CATHETERIZATION N/A 01/09/2015   Procedure: Left Heart Cath and Coronary Angiography;  Surgeon: Leonie Man, MD;  Location: [place] CV LAB;  Service: Cardiovascular;  Laterality: N/A;  . CARPAL TUNNELL  BIL  . CIRCUMCISION    . CORONARY ANGIOPLASTY WITH  STENT PLACEMENT  X1 2010  . HERNIA REPAIR     ING HERNIA REPAIR  . LEFT HEART CATH AND CORONARY ANGIOGRAPHY N/A 10/15/2017   Procedure: LEFT HEART CATH AND CORONARY ANGIOGRAPHY;  Surgeon: Martinique, Peter M, MD;  Location: [place] CV LAB;  Service: Cardiovascular;  Laterality: N/A;  . LEFT HEART CATHETERIZATION WITH CORONARY ANGIOGRAM N/A 10/11/2013   Procedure: LEFT HEART CATHETERIZATION WITH CORONARY ANGIOGRAM;  Surgeon: Sinclair Grooms, MD;  Location: [facility] CATH LAB;  Service: Cardiovascular;  Laterality: N/A;  . LEFT HEART CATHETERIZATION WITH CORONARY ANGIOGRAM N/A 06/21/2014   Procedure: LEFT HEART CATHETERIZATION WITH CORONARY ANGIOGRAM;  Surgeon: Troy Sine, MD;  Location: [facility] CATH LAB;  Service: Cardiovascular;  Laterality: N/A;  . MEATOTOMY  X2  . PERCUTANEOUS CORONARY STENT INTERVENTION (PCI-S) N/A 06/23/2014   Procedure: PERCUTANEOUS CORONARY STENT INTERVENTION (PCI-S);  Surgeon: Jettie Booze, MD;  Location: [facility] CATH LAB;  Service: Cardiovascular;  Laterality: N/A;  . VASECTOMY    . WRIST FRACTURE SURGERY  L  X3 SURGERIES     Current Meds  Medication Sig  . aspirin EC 81 MG tablet Take 81 mg by mouth at bedtime.   Marland Kitchen atorvastatin (LIPITOR) 40 MG tablet Take 40 mg by mouth once daily in the evening  . b complex vitamins tablet Take 1 tablet by mouth daily.  . betamethasone valerate ointment (VALISONE) 0.1 % Apply 1 application topically 2 (two) times daily as needed (itching).  . Butalbital-APAP-Caffeine 50-325-40 MG capsule Take 1 capsule by mouth 3 (three) times daily as needed for headache.   . Cholecalciferol (VITAMIN D) 2000 units tablet Take 2,000 Units by mouth daily.  . citalopram (CELEXA) 20 MG tablet Take 20 mg by mouth daily.   . clonazePAM (KLONOPIN) 0.5 MG tablet Take 0.5 mg by mouth 2 (two) times daily as needed for anxiety.   . diphenhydrAMINE (BENADRYL) 25 MG tablet Take 25-50 mg by mouth 2 (two) times daily as needed for itching.   . fluticasone (CUTIVATE)  0.05 % cream Apply 1 application topically 2 (two) times daily as needed (rash). Applied to face and/or penis area(s)  . furosemide (LASIX) 40 MG tablet Take 120 mg by mouth daily.  Marland Kitchen gabapentin (NEURONTIN) 600 MG tablet Take 1,800 mg by mouth 2 (two) times daily.   Marland Kitchen guaifenesin (HUMIBID E) 400 MG TABS tablet Take 800 mg by mouth daily as needed (for post nasal drip/cough).   Marland Kitchen HYDROmorphone (DILAUDID) 4 MG tablet Take 4 mg by mouth 4 (four) times daily as needed for severe pain.   Marland Kitchen ibuprofen (ADVIL,MOTRIN) 200 MG tablet Take 600 mg by mouth 4 (four) times daily as needed for mild pain or moderate pain.   Marland Kitchen  isosorbide mononitrate (IMDUR) 60 MG 24 hr tablet Take 1.5 tablets (90 mg total) by mouth daily. (Patient taking differently: Take 60 mg by mouth daily. )  . L-Lysine 500 MG TABS Take 500 mg by mouth daily.   Marland Kitchen loperamide (IMODIUM A-D) 2 MG tablet Take 6 mg by mouth 4 (four) times daily as needed for diarrhea or loose stools.  . metoprolol succinate (TOPROL-XL) 50 MG 24 hr tablet Take 1 tablet (50 mg total) by mouth daily. Take with or immediately following a meal.  . Multiple Vitamins-Minerals (MULTIVITAMIN WITH MINERALS) tablet Take 1 tablet by mouth every evening.   . nitroGLYCERIN (NITROSTAT) 0.4 MG SL tablet DISSOLVE 1 TABLET UNDER THE TONGUE EVERY 5 MINUTES AS NEEDED FOR CHEST PAIN. DO NOT EXCEED A TOTAL OF 3 DOSES IN 15 MINUTES  . NON FORMULARY at bedtime. CPAP   . omeprazole (PRILOSEC) 40 MG capsule Take 40 mg by mouth daily before breakfast.   . potassium chloride (K-DUR) 10 MEQ tablet Take 10-20 mEq by mouth See admin instructions. 2 tablets in AM and 1 tablet in PM  . testosterone cypionate (DEPOTESTOSTERONE CYPIONATE) 200 MG/ML injection Inject 100 mg into the muscle every 14 (fourteen) days.   Marland Kitchen topiramate (TOPAMAX) 25 MG tablet Take 75 mg by mouth at bedtime.  . valACYclovir (VALTREX) 500 MG tablet Take 500 mg by mouth 2 (two) times daily as needed (fever blisters).   . vitamin C  (ASCORBIC ACID) 500 MG tablet Take 500 mg by mouth every morning.      Allergies:   Bee venom; Duloxetine; Fentanyl; Lidocaine; Meperidine hcl; Methadone; Oxycodone hcl; Pregabalin; and Zolpidem tartrate   Social History   Tobacco Use  . Smoking status: Former Smoker    Packs/day: 2.50    Years: 32.00    Pack years: 80.00    Types: Cigarettes    Start date: 04/17/1967    Last attempt to quit: 05/12/2000    Years since quitting: 18.4  . Smokeless tobacco: Never Used  Substance Use Topics  . Alcohol use: No    Alcohol/week: 0.0 standard drinks    Comment: RECOVERING ALCOHOLIC 9 YRS- 4765  . Drug use: No     Family Hx: The patient's family history includes Heart disease in his unknown relative.  ROS:   Please see the history of present illness.    All other systems reviewed and are negative. No hemoptysis   Prior CV studies:   The following studies were reviewed today:   Labs/Other Tests and Data Reviewed:    EKG:  No ECG reviewed.  Recent Labs: 10/15/2017: BUN 13; Creatinine, Ser 1.14; Hemoglobin 15.2; Platelets 227; Potassium 3.9; Sodium 140   Recent Lipid Panel Lab Results  Component Value Date/Time   CHOL 168 10/11/2013 03:57 AM   TRIG 172 (H) 10/11/2013 03:57 AM   HDL 34 (L) 10/11/2013 03:57 AM   CHOLHDL 4.9 10/11/2013 03:57 AM   LDLCALC 100 (H) 10/11/2013 03:57 AM    Wt Readings from Last 3 Encounters:  10/01/18 247 lb (112 kg)  11/04/17 259 lb (117.5 kg)  10/15/17 243 lb (110.2 kg)     Objective:    Vital Signs:  BP 124/70   Pulse 64   Ht 5\' 8"  (1.727 m)   Wt 247 lb (112 kg)   BMI 37.56 kg/m    VITAL SIGNS:  reviewed Pt was in no acute distress during the interview ASSESSMENT & PLAN:    Chest pain and SOB- In retrospect the  patient feels this was secondary to anxiety.  He says his symptoms resolved once he saw his PCP.  CAD- As described above- medical rx after his last two caths Aug 2016 and June 2019.  Osteomyelitis great toe- This will  require amputation next week at [facility] to hold ASA 48 hours pre op.   COVID-19 Education: The signs and symptoms of COVID-19 were discussed with the patient and how to seek care for testing (follow up with PCP or arrange E-visit).  The importance of social distancing was discussed today.  Time:   Today, I have spent 15 minutes with the patient with telehealth technology discussing the above problems.     Medication Adjustments/Labs and Tests Ordered: Current medicines are reviewed at length with the patient today.  Concerns regarding medicines are outlined above.   Tests Ordered: No orders of the defined types were placed in this encounter.   Medication Changes: No orders of the defined types were placed in this encounter.   Disposition:  Follow up I suggested the patient been seen in the clinic in two weeks post op  Signed, Kerin Ransom, PA-C  10/01/2018 2:48 PM    [place]

## 2018-10-01 NOTE — Patient Instructions (Signed)
Your physician recommends that you schedule a follow-up appointment in: [place]   Your physician recommends that you continue on your current medications as directed. Please refer to the Current Medication list given to you today.  HOLD ASPIRIN 48 HOURS PRIOR TO YOUR PROCEDURE   Thank you for choosing [facility]!!

## 2018-10-21 ENCOUNTER — Ambulatory Visit: Payer: 59 | Admitting: Cardiovascular Disease

## 2018-11-17 ENCOUNTER — Other Ambulatory Visit: Payer: Self-pay | Admitting: Cardiovascular Disease

## 2018-11-30 ENCOUNTER — Other Ambulatory Visit: Payer: Self-pay

## 2018-11-30 ENCOUNTER — Other Ambulatory Visit: Payer: 59

## 2018-11-30 DIAGNOSIS — Z20822 Contact with and (suspected) exposure to covid-19: Secondary | ICD-10-CM

## 2018-12-01 ENCOUNTER — Other Ambulatory Visit: Payer: Self-pay

## 2018-12-01 MED ORDER — METOPROLOL SUCCINATE ER 50 MG PO TB24: 50.0000 mg | ORAL_TABLET | Freq: Every day | ORAL | 3 refills | Status: AC

## 2018-12-01 NOTE — Telephone Encounter (Signed)
Refill sent for metoprolol to [facility].

## 2018-12-03 LAB — NOVEL CORONAVIRUS, NAA: SARS-CoV-2, NAA: NOT DETECTED

## 2018-12-06 ENCOUNTER — Encounter: Payer: Self-pay | Admitting: *Deleted

## 2018-12-07 ENCOUNTER — Ambulatory Visit (INDEPENDENT_AMBULATORY_CARE_PROVIDER_SITE_OTHER): Payer: Medicare PPO | Admitting: Cardiovascular Disease

## 2018-12-07 ENCOUNTER — Other Ambulatory Visit: Payer: Self-pay

## 2018-12-07 ENCOUNTER — Encounter: Payer: Self-pay | Admitting: Cardiovascular Disease

## 2018-12-07 VITALS — BP 118/70 | HR 64 | Temp 98.4°F | Ht 68.0 in | Wt 260.0 lb

## 2018-12-07 DIAGNOSIS — I10 Essential (primary) hypertension: Secondary | ICD-10-CM | POA: Diagnosis not present

## 2018-12-07 DIAGNOSIS — G4733 Obstructive sleep apnea (adult) (pediatric): Secondary | ICD-10-CM | POA: Diagnosis not present

## 2018-12-07 DIAGNOSIS — I5032 Chronic diastolic (congestive) heart failure: Secondary | ICD-10-CM

## 2018-12-07 DIAGNOSIS — I25118 Atherosclerotic heart disease of native coronary artery with other forms of angina pectoris: Secondary | ICD-10-CM

## 2018-12-07 DIAGNOSIS — Z9989 Dependence on other enabling machines and devices: Secondary | ICD-10-CM

## 2018-12-07 DIAGNOSIS — E785 Hyperlipidemia, unspecified: Secondary | ICD-10-CM

## 2018-12-07 MED ORDER — ISOSORBIDE MONONITRATE ER 120 MG PO TB24: 120.0000 mg | ORAL_TABLET | Freq: Every day | ORAL | 3 refills | Status: DC

## 2018-12-07 NOTE — Progress Notes (Signed)
SUBJECTIVE: The patient presents for routine follow-up for coronary artery disease. He underwent left great toe amputation earlier this year for osteomyelitis.  He underwent coronary angiography in June 2019 which showed no significant changes from his previous coronary angiogram in August 2016.  Medical therapy was recommended.  Previously placed proximal LAD to mid LAD stent was widely patent.  There is an acute marginal lesion which had an 80% stenosis, 70% stenosis of an ostial first diagonal lesion, and 50% stenosis of a ostial second diagonal branch.  LVEDP was normal as was left ventricular ejection fraction, 55 to 65% by visual estimate.  He has a history of anxiety with panic attacks and claustrophobia, along with chronic pain for which he takes Dilaudid and Motrin.  He has fibromyalgia with diffuse pain.  ECG performed today which I ordered and personally interpreted demonstrates sinus rhythm with old anteroseptal infarct.  Since his left toe amputation, he has been doing well from that standpoint.  He complains of retrosternal chest pain occurring about once per week.  It is usually alleviated with 1 nitroglycerin tablet.  He denies dysphagia for solids and issues with GERD.  He continues to have fatigue.  He has sleep apnea and uses CPAP.   Review of Systems: As per "subjective", otherwise negative.  Allergies  Allergen Reactions  . Bee Venom Anaphylaxis  . Duloxetine Swelling    CHF  . Fentanyl Other (See Comments)    Confusion, panic attacks with the patch,  Fentanyl injected is ok  . Lidocaine Other (See Comments)    REACTION: non-reactive  . Meperidine Hcl Other (See Comments)    REACTION: causes extreme mental reactions.  . Methadone Other (See Comments)    REACTION: causes heart failure  . Oxycodone Hcl Other (See Comments)    REACTION: causes heart failure  . Pregabalin Other (See Comments)    REACTION: chf  . Zolpidem Tartrate Other (See Comments)   REACTION: confusion    Current Outpatient Medications  Medication Sig Dispense Refill  . aspirin EC 81 MG tablet Take 81 mg by mouth at bedtime.     Marland Kitchen atorvastatin (LIPITOR) 40 MG tablet Take 40 mg by mouth once daily in the evening 90 tablet 3  . b complex vitamins tablet Take 1 tablet by mouth daily.    . betamethasone valerate ointment (VALISONE) 0.1 % Apply 1 application topically 2 (two) times daily as needed (itching).    . Butalbital-APAP-Caffeine 50-325-40 MG capsule Take 1 capsule by mouth 3 (three) times daily as needed for headache.     . Cholecalciferol (VITAMIN D) 2000 units tablet Take 2,000 Units by mouth daily.    . citalopram (CELEXA) 20 MG tablet Take 20 mg by mouth daily.     . clonazePAM (KLONOPIN) 0.5 MG tablet Take 0.5 mg by mouth 2 (two) times daily as needed for anxiety.     . diphenhydrAMINE (BENADRYL) 25 MG tablet Take 25-50 mg by mouth 2 (two) times daily as needed for itching.     . fluticasone (CUTIVATE) 0.05 % cream Apply 1 application topically 2 (two) times daily as needed (rash). Applied to face and/or penis area(s)    . furosemide (LASIX) 40 MG tablet Take 120 mg by mouth daily.    Marland Kitchen gabapentin (NEURONTIN) 600 MG tablet Take 1,800 mg by mouth 2 (two) times daily.     Marland Kitchen guaifenesin (HUMIBID E) 400 MG TABS tablet Take 800 mg by mouth daily as needed (for post  nasal drip/cough).     Marland Kitchen HYDROmorphone (DILAUDID) 4 MG tablet Take 4 mg by mouth 4 (four) times daily as needed for severe pain.     Marland Kitchen ibuprofen (ADVIL,MOTRIN) 200 MG tablet Take 600 mg by mouth 4 (four) times daily as needed for mild pain or moderate pain.     Marland Kitchen L-Lysine 500 MG TABS Take 500 mg by mouth daily.     Marland Kitchen loperamide (IMODIUM A-D) 2 MG tablet Take 6 mg by mouth 4 (four) times daily as needed for diarrhea or loose stools.    . metoprolol succinate (TOPROL-XL) 50 MG 24 hr tablet Take 1 tablet (50 mg total) by mouth daily. Take with or immediately following a meal. 90 tablet 3  . Multiple  Vitamins-Minerals (MULTIVITAMIN WITH MINERALS) tablet Take 1 tablet by mouth every evening.     . nitroGLYCERIN (NITROSTAT) 0.4 MG SL tablet DISSOLVE 1 TABLET UNDER THE TONGUE EVERY 5 MINUTES AS NEEDED FOR CHEST PAIN. DO NOT EXCEED A TOTAL OF 3 DOSES IN 15 MINUTES 75 tablet 0  . NON FORMULARY at bedtime. CPAP     . omeprazole (PRILOSEC) 40 MG capsule Take 40 mg by mouth daily before breakfast.     . polyethylene glycol (MIRALAX / GLYCOLAX) packet Take 17 g by mouth daily as needed for moderate constipation.    . polyvinyl alcohol-povidone (HYPOTEARS) 1.4-0.6 % ophthalmic solution Place 1-2 drops into both eyes daily as needed (dry eyes). For dry eyes    . potassium chloride (K-DUR) 10 MEQ tablet Take 10-20 mEq by mouth See admin instructions. 2 tablets in AM and 1 tablet in PM    . testosterone cypionate (DEPOTESTOSTERONE CYPIONATE) 200 MG/ML injection Inject 100 mg into the muscle every 14 (fourteen) days.     Marland Kitchen topiramate (TOPAMAX) 25 MG tablet Take 75 mg by mouth at bedtime.    . valACYclovir (VALTREX) 500 MG tablet Take 500 mg by mouth 2 (two) times daily as needed (fever blisters).     . vitamin C (ASCORBIC ACID) 500 MG tablet Take 500 mg by mouth every morning.     . isosorbide mononitrate (IMDUR) 60 MG 24 hr tablet Take 1.5 tablets (90 mg total) by mouth daily. (Patient taking differently: Take 60 mg by mouth daily. ) 135 tablet 3   No current facility-administered medications for this visit.     Past Medical History:  Diagnosis Date  . Anxiety    HX PANIC ATTACKS  . CHF (congestive heart failure) ([place])   . Chronic cough   . Coronary artery disease    STENT X 1 2010 Mid LAD  . Dry eyes   . Fibromyalgia   . Fluid retention   . GERD (gastroesophageal reflux disease)   . Headache(784.0)   . Herpes genitalis in men   . History of kidney stones   . History of vertigo   . Hypertension   . Narcolepsy   . Neuropathy   . Penile lesion   . Sjogren's disease ([place])   . Sleep apnea     USES C-PAP    Past Surgical History:  Procedure Laterality Date  . BREAST SURGERY     BIL MASTECTOMY  FOR BENIGN LUMPS  . CARDIAC CATHETERIZATION N/A 01/09/2015   Procedure: Left Heart Cath and Coronary Angiography;  Surgeon: Leonie Man, MD;  Location: [place] CV LAB;  Service: Cardiovascular;  Laterality: N/A;  . CARPAL TUNNELL  BIL  . CIRCUMCISION    . CORONARY ANGIOPLASTY WITH  [place] 2010  . HERNIA REPAIR     ING HERNIA REPAIR  . LEFT HEART CATH AND CORONARY ANGIOGRAPHY N/A 10/15/2017   Procedure: LEFT HEART CATH AND CORONARY ANGIOGRAPHY;  Surgeon: Martinique, Peter M, MD;  Location: [place] CV LAB;  Service: Cardiovascular;  Laterality: N/A;  . LEFT HEART CATHETERIZATION WITH CORONARY ANGIOGRAM N/A 10/11/2013   Procedure: LEFT HEART CATHETERIZATION WITH CORONARY ANGIOGRAM;  Surgeon: Sinclair Grooms, MD;  Location: [facility] CATH LAB;  Service: Cardiovascular;  Laterality: N/A;  . LEFT HEART CATHETERIZATION WITH CORONARY ANGIOGRAM N/A 06/21/2014   Procedure: LEFT HEART CATHETERIZATION WITH CORONARY ANGIOGRAM;  Surgeon: Troy Sine, MD;  Location: [facility] CATH LAB;  Service: Cardiovascular;  Laterality: N/A;  . MEATOTOMY  X2  . PERCUTANEOUS CORONARY STENT INTERVENTION (PCI-S) N/A 06/23/2014   Procedure: PERCUTANEOUS CORONARY STENT INTERVENTION (PCI-S);  Surgeon: Jettie Booze, MD;  Location: [facility] CATH LAB;  Service: Cardiovascular;  Laterality: N/A;  . VASECTOMY    . WRIST FRACTURE SURGERY  L  X3 SURGERIES    Social History   Socioeconomic History  . Marital status: Married    Spouse name: Not on file  . Number of children: Not on file  . Years of education: Not on file  . Highest education level: Not on file  Occupational History  . Not on file  Social Needs  . Financial resource strain: Not on file  . Food insecurity    Worry: Not on file    Inability: Not on file  . Transportation needs    Medical: Not on file    Non-medical: Not on file  Tobacco Use  .  Smoking status: Former Smoker    Packs/day: 2.50    Years: 32.00    Pack years: 80.00    Types: Cigarettes    Start date: 04/17/1967    Quit date: 05/12/2000    Years since quitting: 18.5  . Smokeless tobacco: Never Used  Substance and Sexual Activity  . Alcohol use: No    Alcohol/week: 0.0 standard drinks    Comment: RECOVERING ALCOHOLIC 9 YRS- 0960  . Drug use: No  . Sexual activity: Not on file  Lifestyle  . Physical activity    Days per week: Not on file    Minutes per session: Not on file  . Stress: Not on file  Relationships  . Social Herbalist on phone: Not on file    Gets together: Not on file    Attends religious service: Not on file    Active member of club or organization: Not on file    Attends meetings of clubs or organizations: Not on file    Relationship status: Not on file  . Intimate partner violence    Fear of current or ex partner: Not on file    Emotionally abused: Not on file    Physically abused: Not on file    Forced sexual activity: Not on file  Other Topics Concern  . Not on file  Social History Narrative  . Not on file     Vitals:   12/07/18 1553  BP: 118/70  Pulse: 64  Temp: 98.4 F (36.9 C)  SpO2: 97%  Weight: 260 lb (117.9 kg)  Height: 5\' 8"  (1.727 m)    Wt Readings from Last 3 Encounters:  12/07/18 260 lb (117.9 kg)  10/01/18 247 lb (112 kg)  11/04/17 259 lb (117.5 kg)     PHYSICAL EXAM General: NAD  HEENT: Normal. Neck: No JVD, no thyromegaly. Lungs: Clear to auscultation bilaterally with normal respiratory effort. CV: Regular rate and rhythm, normal S1/S2, no S3/S4, no murmur. No pretibial or periankle edema.  No carotid bruit.   Abdomen: Soft, nontender, no distention.  Neurologic: Alert and oriented.  Psych: Normal affect. Skin: Normal. Musculoskeletal: No gross deformities.    ECG: Reviewed above under Subjective   Labs: Lab Results  Component Value Date/Time   K 3.9 10/15/2017 06:23 AM   BUN 13  10/15/2017 06:23 AM   CREATININE 1.14 10/15/2017 06:23 AM   ALT 21 07/30/2011 09:18 PM   HGB 15.2 10/15/2017 06:23 AM     Lipids: Lab Results  Component Value Date/Time   LDLCALC 100 (H) 10/11/2013 03:57 AM   CHOL 168 10/11/2013 03:57 AM   TRIG 172 (H) 10/11/2013 03:57 AM   HDL 34 (L) 10/11/2013 03:57 AM       ASSESSMENT AND PLAN: 1.  Coronary artery disease: Coronary angiogram reviewed above.  No significant changes from 2016.  He continues to experience chest pain.  I will increase Imdur to 120 mg. Continue aspirin, Lipitor, and Toprol-XL.  If this is unsuccessful in alleviating symptoms, I would perhaps consider a nitrate free interval with the institution of [place].  2.  Hypertension: Controlled on present therapy.  No changes.  3.  Hyperlipidemia: Continue Lipitor 40 mg.  LDL 67 on 09/24/2018.  4.  Chronic diastolic heart failure: Compensated on Lasix 40 mg 3 times daily.  He takes supplemental potassium as well.  No changes.  5. OSA: On CPAP.    Disposition: Follow up 6 months   Kate Sable, M.D., F.A.C.C.

## 2018-12-07 NOTE — Patient Instructions (Signed)
Medication Instructions:   Increase Imdur to 120mg  daily.  Continue all other current medications.  Labwork: none  Testing/Procedures: none  Follow-Up: Your physician wants you to follow up in: 6 months.  You will receive a reminder letter in the mail one-two months in advance.  If you don't receive a letter, please call our office to schedule the follow up appointment     Any Other Special Instructions Will Be Listed Below (If Applicable).  If you need a refill on your cardiac medications before your next appointment, please call your pharmacy.

## 2018-12-08 ENCOUNTER — Telehealth: Payer: Self-pay | Admitting: Family Medicine

## 2018-12-08 NOTE — Telephone Encounter (Signed)
Advised patient his COVID 19 test result is negative.

## 2018-12-24 DIAGNOSIS — M5416 Radiculopathy, lumbar region: Secondary | ICD-10-CM | POA: Diagnosis not present

## 2018-12-24 DIAGNOSIS — M545 Low back pain: Secondary | ICD-10-CM | POA: Diagnosis not present

## 2019-01-07 ENCOUNTER — Telehealth: Payer: Self-pay | Admitting: Cardiovascular Disease

## 2019-01-07 NOTE — Telephone Encounter (Signed)
Patient wanted to know if he is supposed to take lisinopril and lipitor. Advised that he is on lipitor 40 mg daily but does not take lisinopril according to his last office visit and medication profile. Verbalized understanding.

## 2019-01-07 NOTE — Telephone Encounter (Signed)
Patient has a question about his medications.   Please call [phone].

## 2019-01-12 ENCOUNTER — Other Ambulatory Visit: Payer: Self-pay | Admitting: Cardiovascular Disease

## 2019-02-02 DIAGNOSIS — B351 Tinea unguium: Secondary | ICD-10-CM | POA: Diagnosis not present

## 2019-02-02 DIAGNOSIS — I70203 Unspecified atherosclerosis of native arteries of extremities, bilateral legs: Secondary | ICD-10-CM | POA: Diagnosis not present

## 2019-02-02 DIAGNOSIS — G4733 Obstructive sleep apnea (adult) (pediatric): Secondary | ICD-10-CM | POA: Diagnosis not present

## 2019-02-02 DIAGNOSIS — L84 Corns and callosities: Secondary | ICD-10-CM | POA: Diagnosis not present

## 2019-02-02 DIAGNOSIS — M79676 Pain in unspecified toe(s): Secondary | ICD-10-CM | POA: Diagnosis not present

## 2019-03-02 DIAGNOSIS — I2510 Atherosclerotic heart disease of native coronary artery without angina pectoris: Secondary | ICD-10-CM | POA: Diagnosis not present

## 2019-03-02 DIAGNOSIS — Z6839 Body mass index (BMI) 39.0-39.9, adult: Secondary | ICD-10-CM | POA: Diagnosis not present

## 2019-03-02 DIAGNOSIS — F419 Anxiety disorder, unspecified: Secondary | ICD-10-CM | POA: Diagnosis not present

## 2019-03-02 DIAGNOSIS — K219 Gastro-esophageal reflux disease without esophagitis: Secondary | ICD-10-CM | POA: Diagnosis not present

## 2019-03-02 DIAGNOSIS — I10 Essential (primary) hypertension: Secondary | ICD-10-CM | POA: Diagnosis not present

## 2019-03-02 DIAGNOSIS — I5032 Chronic diastolic (congestive) heart failure: Secondary | ICD-10-CM | POA: Diagnosis not present

## 2019-03-02 DIAGNOSIS — J449 Chronic obstructive pulmonary disease, unspecified: Secondary | ICD-10-CM | POA: Diagnosis not present

## 2019-03-02 DIAGNOSIS — E291 Testicular hypofunction: Secondary | ICD-10-CM | POA: Diagnosis not present

## 2019-03-03 DIAGNOSIS — Z79891 Long term (current) use of opiate analgesic: Secondary | ICD-10-CM | POA: Diagnosis not present

## 2019-03-03 DIAGNOSIS — M79643 Pain in unspecified hand: Secondary | ICD-10-CM | POA: Diagnosis not present

## 2019-03-03 DIAGNOSIS — R2689 Other abnormalities of gait and mobility: Secondary | ICD-10-CM | POA: Diagnosis not present

## 2019-03-03 DIAGNOSIS — M542 Cervicalgia: Secondary | ICD-10-CM | POA: Diagnosis not present

## 2019-03-03 DIAGNOSIS — M545 Low back pain: Secondary | ICD-10-CM | POA: Diagnosis not present

## 2019-03-03 DIAGNOSIS — M79606 Pain in leg, unspecified: Secondary | ICD-10-CM | POA: Diagnosis not present

## 2019-03-07 DIAGNOSIS — M12812 Other specific arthropathies, not elsewhere classified, left shoulder: Secondary | ICD-10-CM | POA: Diagnosis not present

## 2019-03-07 DIAGNOSIS — M19012 Primary osteoarthritis, left shoulder: Secondary | ICD-10-CM | POA: Diagnosis not present

## 2019-03-07 DIAGNOSIS — M19011 Primary osteoarthritis, right shoulder: Secondary | ICD-10-CM | POA: Diagnosis not present

## 2019-03-07 DIAGNOSIS — M25511 Pain in right shoulder: Secondary | ICD-10-CM | POA: Diagnosis not present

## 2019-03-07 DIAGNOSIS — M25512 Pain in left shoulder: Secondary | ICD-10-CM | POA: Diagnosis not present

## 2019-03-07 DIAGNOSIS — M12811 Other specific arthropathies, not elsewhere classified, right shoulder: Secondary | ICD-10-CM | POA: Diagnosis not present

## 2019-04-11 DIAGNOSIS — S83272A Complex tear of lateral meniscus, current injury, left knee, initial encounter: Secondary | ICD-10-CM | POA: Diagnosis not present

## 2019-04-11 DIAGNOSIS — M19012 Primary osteoarthritis, left shoulder: Secondary | ICD-10-CM | POA: Diagnosis not present

## 2019-04-11 DIAGNOSIS — M25562 Pain in left knee: Secondary | ICD-10-CM | POA: Diagnosis not present

## 2019-04-12 DIAGNOSIS — M19012 Primary osteoarthritis, left shoulder: Secondary | ICD-10-CM | POA: Diagnosis not present

## 2019-04-15 DIAGNOSIS — Z01818 Encounter for other preprocedural examination: Secondary | ICD-10-CM | POA: Diagnosis not present

## 2019-04-19 DIAGNOSIS — M1712 Unilateral primary osteoarthritis, left knee: Secondary | ICD-10-CM | POA: Diagnosis not present

## 2019-04-19 DIAGNOSIS — E785 Hyperlipidemia, unspecified: Secondary | ICD-10-CM | POA: Diagnosis not present

## 2019-04-19 DIAGNOSIS — S83282A Other tear of lateral meniscus, current injury, left knee, initial encounter: Secondary | ICD-10-CM | POA: Diagnosis not present

## 2019-04-19 DIAGNOSIS — I110 Hypertensive heart disease with heart failure: Secondary | ICD-10-CM | POA: Diagnosis not present

## 2019-04-19 DIAGNOSIS — M2342 Loose body in knee, left knee: Secondary | ICD-10-CM | POA: Diagnosis not present

## 2019-04-19 DIAGNOSIS — G629 Polyneuropathy, unspecified: Secondary | ICD-10-CM | POA: Diagnosis not present

## 2019-04-19 DIAGNOSIS — M797 Fibromyalgia: Secondary | ICD-10-CM | POA: Diagnosis not present

## 2019-04-19 DIAGNOSIS — I509 Heart failure, unspecified: Secondary | ICD-10-CM | POA: Diagnosis not present

## 2019-04-19 DIAGNOSIS — M23204 Derangement of unspecified medial meniscus due to old tear or injury, left knee: Secondary | ICD-10-CM | POA: Diagnosis not present

## 2019-04-19 DIAGNOSIS — S83242A Other tear of medial meniscus, current injury, left knee, initial encounter: Secondary | ICD-10-CM | POA: Diagnosis not present

## 2019-05-10 DIAGNOSIS — G4733 Obstructive sleep apnea (adult) (pediatric): Secondary | ICD-10-CM | POA: Diagnosis not present

## 2019-05-12 DIAGNOSIS — I2510 Atherosclerotic heart disease of native coronary artery without angina pectoris: Secondary | ICD-10-CM | POA: Diagnosis not present

## 2019-05-12 DIAGNOSIS — I10 Essential (primary) hypertension: Secondary | ICD-10-CM | POA: Diagnosis not present

## 2019-05-26 DIAGNOSIS — M545 Low back pain: Secondary | ICD-10-CM | POA: Diagnosis not present

## 2019-05-26 DIAGNOSIS — M79643 Pain in unspecified hand: Secondary | ICD-10-CM | POA: Diagnosis not present

## 2019-05-26 DIAGNOSIS — E1142 Type 2 diabetes mellitus with diabetic polyneuropathy: Secondary | ICD-10-CM | POA: Diagnosis not present

## 2019-05-26 DIAGNOSIS — M79606 Pain in leg, unspecified: Secondary | ICD-10-CM | POA: Diagnosis not present

## 2019-05-26 DIAGNOSIS — Z79891 Long term (current) use of opiate analgesic: Secondary | ICD-10-CM | POA: Diagnosis not present

## 2019-05-26 DIAGNOSIS — R2689 Other abnormalities of gait and mobility: Secondary | ICD-10-CM | POA: Diagnosis not present

## 2019-06-10 DIAGNOSIS — I10 Essential (primary) hypertension: Secondary | ICD-10-CM | POA: Diagnosis not present

## 2019-06-10 DIAGNOSIS — I2510 Atherosclerotic heart disease of native coronary artery without angina pectoris: Secondary | ICD-10-CM | POA: Diagnosis not present

## 2019-06-14 DIAGNOSIS — M25512 Pain in left shoulder: Secondary | ICD-10-CM | POA: Diagnosis not present

## 2019-06-14 DIAGNOSIS — M19012 Primary osteoarthritis, left shoulder: Secondary | ICD-10-CM | POA: Diagnosis not present

## 2019-06-14 DIAGNOSIS — M7542 Impingement syndrome of left shoulder: Secondary | ICD-10-CM | POA: Diagnosis not present

## 2019-06-20 DIAGNOSIS — B351 Tinea unguium: Secondary | ICD-10-CM | POA: Diagnosis not present

## 2019-06-20 DIAGNOSIS — L84 Corns and callosities: Secondary | ICD-10-CM | POA: Diagnosis not present

## 2019-06-20 DIAGNOSIS — M79676 Pain in unspecified toe(s): Secondary | ICD-10-CM | POA: Diagnosis not present

## 2019-06-20 DIAGNOSIS — I70203 Unspecified atherosclerosis of native arteries of extremities, bilateral legs: Secondary | ICD-10-CM | POA: Diagnosis not present

## 2019-06-21 DIAGNOSIS — M75122 Complete rotator cuff tear or rupture of left shoulder, not specified as traumatic: Secondary | ICD-10-CM | POA: Diagnosis not present

## 2019-06-21 DIAGNOSIS — M25512 Pain in left shoulder: Secondary | ICD-10-CM | POA: Diagnosis not present

## 2019-06-21 DIAGNOSIS — M75102 Unspecified rotator cuff tear or rupture of left shoulder, not specified as traumatic: Secondary | ICD-10-CM | POA: Diagnosis not present

## 2019-06-23 DIAGNOSIS — E1142 Type 2 diabetes mellitus with diabetic polyneuropathy: Secondary | ICD-10-CM | POA: Diagnosis not present

## 2019-06-23 DIAGNOSIS — R2689 Other abnormalities of gait and mobility: Secondary | ICD-10-CM | POA: Diagnosis not present

## 2019-06-23 DIAGNOSIS — M79643 Pain in unspecified hand: Secondary | ICD-10-CM | POA: Diagnosis not present

## 2019-06-23 DIAGNOSIS — M542 Cervicalgia: Secondary | ICD-10-CM | POA: Diagnosis not present

## 2019-06-29 DIAGNOSIS — M75122 Complete rotator cuff tear or rupture of left shoulder, not specified as traumatic: Secondary | ICD-10-CM | POA: Diagnosis not present

## 2019-06-29 DIAGNOSIS — M19012 Primary osteoarthritis, left shoulder: Secondary | ICD-10-CM | POA: Diagnosis not present

## 2019-07-04 DIAGNOSIS — R739 Hyperglycemia, unspecified: Secondary | ICD-10-CM | POA: Diagnosis not present

## 2019-07-04 DIAGNOSIS — I10 Essential (primary) hypertension: Secondary | ICD-10-CM | POA: Diagnosis not present

## 2019-07-04 DIAGNOSIS — I5032 Chronic diastolic (congestive) heart failure: Secondary | ICD-10-CM | POA: Diagnosis not present

## 2019-07-04 DIAGNOSIS — K219 Gastro-esophageal reflux disease without esophagitis: Secondary | ICD-10-CM | POA: Diagnosis not present

## 2019-07-04 DIAGNOSIS — J449 Chronic obstructive pulmonary disease, unspecified: Secondary | ICD-10-CM | POA: Diagnosis not present

## 2019-07-07 DIAGNOSIS — E291 Testicular hypofunction: Secondary | ICD-10-CM | POA: Diagnosis not present

## 2019-07-07 DIAGNOSIS — I10 Essential (primary) hypertension: Secondary | ICD-10-CM | POA: Diagnosis not present

## 2019-07-07 DIAGNOSIS — Z6837 Body mass index (BMI) 37.0-37.9, adult: Secondary | ICD-10-CM | POA: Diagnosis not present

## 2019-07-07 DIAGNOSIS — I2510 Atherosclerotic heart disease of native coronary artery without angina pectoris: Secondary | ICD-10-CM | POA: Diagnosis not present

## 2019-07-07 DIAGNOSIS — I5032 Chronic diastolic (congestive) heart failure: Secondary | ICD-10-CM | POA: Diagnosis not present

## 2019-07-07 DIAGNOSIS — K219 Gastro-esophageal reflux disease without esophagitis: Secondary | ICD-10-CM | POA: Diagnosis not present

## 2019-07-07 DIAGNOSIS — J449 Chronic obstructive pulmonary disease, unspecified: Secondary | ICD-10-CM | POA: Diagnosis not present

## 2019-07-07 DIAGNOSIS — F419 Anxiety disorder, unspecified: Secondary | ICD-10-CM | POA: Diagnosis not present

## 2019-07-08 DIAGNOSIS — I110 Hypertensive heart disease with heart failure: Secondary | ICD-10-CM | POA: Diagnosis not present

## 2019-07-08 DIAGNOSIS — I5032 Chronic diastolic (congestive) heart failure: Secondary | ICD-10-CM | POA: Diagnosis not present

## 2019-07-13 ENCOUNTER — Encounter: Payer: Self-pay | Admitting: Cardiovascular Disease

## 2019-07-13 ENCOUNTER — Telehealth (INDEPENDENT_AMBULATORY_CARE_PROVIDER_SITE_OTHER): Payer: Medicare PPO | Admitting: Cardiovascular Disease

## 2019-07-13 VITALS — BP 124/76 | Ht 68.0 in | Wt 236.0 lb

## 2019-07-13 DIAGNOSIS — E785 Hyperlipidemia, unspecified: Secondary | ICD-10-CM

## 2019-07-13 DIAGNOSIS — I25118 Atherosclerotic heart disease of native coronary artery with other forms of angina pectoris: Secondary | ICD-10-CM

## 2019-07-13 DIAGNOSIS — Z955 Presence of coronary angioplasty implant and graft: Secondary | ICD-10-CM

## 2019-07-13 DIAGNOSIS — Z01818 Encounter for other preprocedural examination: Secondary | ICD-10-CM

## 2019-07-13 DIAGNOSIS — I10 Essential (primary) hypertension: Secondary | ICD-10-CM

## 2019-07-13 DIAGNOSIS — I5032 Chronic diastolic (congestive) heart failure: Secondary | ICD-10-CM

## 2019-07-13 DIAGNOSIS — G4733 Obstructive sleep apnea (adult) (pediatric): Secondary | ICD-10-CM

## 2019-07-13 NOTE — Patient Instructions (Signed)
Medication Instructions:  Your physician recommends that you continue on your current medications as directed. Please refer to the Current Medication list given to you today.   Labwork: none  Testing/Procedures: none  Follow-Up: Your physician wants you to follow-up in: 6 months.  You will receive a reminder letter in the mail two months in advance. If you don't receive a letter, please call our office to schedule the follow-up appointment.   Any Other Special Instructions Will Be Listed Below (If Applicable).  Dr. Koren Bound states that you may proceed with shoulder surgery with a low risk     If you need a refill on your cardiac medications before your next appointment, please call your pharmacy.

## 2019-07-13 NOTE — Progress Notes (Signed)
Virtual Visit via Telephone Note   This visit type was conducted due to national recommendations for restrictions regarding the COVID-19 Pandemic (e.g. social distancing) in an effort to limit this patient's exposure and mitigate transmission in our community.  Due to his co-morbid illnesses, this patient is at least at moderate risk for complications without adequate follow up.  This format is felt to be most appropriate for this patient at this time.  The patient did not have access to video technology/had technical difficulties with video requiring transitioning to audio format only (telephone).  All issues noted in this document were discussed and addressed.  No physical exam could be performed with this format.  Please refer to the patient's chart for his  consent to telehealth for [facility].   Date:  07/13/2019   ID:  [name], DOB 01/01/1951, MRN [id number]  Patient Location: Home Provider Location: Home  PCP:  Devin Percy, MD  Cardiologist:  Kate Sable, MD  Electrophysiologist:  None   Evaluation Performed:  Follow-Up Visit  Chief Complaint:  CAD  History of Present Illness:    [name] is a 69 y.o. male with coronary artery disease, HTN, HLD, OSA, and chronic diastolic heart failure.  He underwent coronary angiography in June 2019 which showed no significant changes from his previous coronary angiogram in August 2016. Medical therapy was recommended. Previously placed proximal LAD to mid LAD stent was widely patent. There is an acute marginal lesion which had an 80% stenosis, 70% stenosis of an ostial first diagonal lesion, and 50% stenosis of a ostial second diagonal branch. LVEDP was normal as was left ventricular ejection fraction, 55 to 65% by visual estimate.  He has a history ofanxiety with panic attacks and claustrophobia, along with chronic pain for which he takes Dilaudid and Motrin.He has fibromyalgia with diffuse pain.  He is no  longer having chest pain ever since I increased Imdur to 120 mg daily. His energy levels are back to normal. He hasn't had to take SL nitro in 3 months.  He got COVID19 and symptoms lasted about 4 days.  He hasn't been getting a lot of exercise but plans to begin walking as the weather starts warming up.  He takes care of his wife who is disabled.  He is being scheduled undergo left rotator cuff repair.   Past Medical History:  Diagnosis Date  . Anxiety    HX PANIC ATTACKS  . CHF (congestive heart failure) ([place])   . Chronic cough   . Coronary artery disease    STENT X 1 2010 Mid LAD  . Dry eyes   . Fibromyalgia   . Fluid retention   . GERD (gastroesophageal reflux disease)   . Headache(784.0)   . Herpes genitalis in men   . History of kidney stones   . History of vertigo   . Hypertension   . Narcolepsy   . Neuropathy   . Penile lesion   . Sjogren's disease ([place])   . Sleep apnea    USES C-PAP   Past Surgical History:  Procedure Laterality Date  . BREAST SURGERY     BIL MASTECTOMY  FOR BENIGN LUMPS  . CARDIAC CATHETERIZATION N/A 01/09/2015   Procedure: Left Heart Cath and Coronary Angiography;  Surgeon: Leonie Man, MD;  Location: [place] [facility];  Service: Cardiovascular;  Laterality: N/A;  . CARPAL TUNNELL  BIL  . CIRCUMCISION    . CORONARY ANGIOPLASTY WITH STENT PLACEMENT  X1  2010  . HERNIA REPAIR     ING HERNIA REPAIR  . LEFT HEART CATH AND CORONARY ANGIOGRAPHY N/A 10/15/2017   Procedure: LEFT HEART CATH AND CORONARY ANGIOGRAPHY;  Surgeon: Martinique, Peter M, MD;  Location: [place] [facility];  Service: Cardiovascular;  Laterality: N/A;  . LEFT HEART CATHETERIZATION WITH CORONARY ANGIOGRAM N/A 10/11/2013   Procedure: LEFT HEART CATHETERIZATION WITH CORONARY ANGIOGRAM;  Surgeon: Sinclair Grooms, MD;  Location: [facility] C[facility];  Service: Cardiovascular;  Laterality: N/A;  . LEFT HEART CATHETERIZATION WITH CORONARY ANGIOGRAM N/A 06/21/2014   Procedure: LEFT HEART  CATHETERIZATION WITH CORONARY ANGIOGRAM;  Surgeon: Troy Sine, MD;  Location: [facility] C[facility] LAB;  Service: Cardiovascular;  Laterality: N/A;  . MEATOTOMY  X2  . PERCUTANEOUS CORONARY STENT INTERVENTION (PCI-S) N/A 06/23/2014   Procedure: PERCUTANEOUS CORONARY STENT INTERVENTION (PCI-S);  Surgeon: Jettie Booze, MD;  Location: [facility] CATH LAB;  Service: Cardiovascular;  Laterality: N/A;  . VASECTOMY    . WRIST FRACTURE SURGERY  L  X3 SURGERIES     Current Meds  Medication Sig  . aspirin EC 81 MG tablet Take 81 mg by mouth at bedtime.   Marland Kitchen atorvastatin (LIPITOR) 40 MG tablet Take 40 mg by mouth once daily in the evening  . b complex vitamins tablet Take 1 tablet by mouth daily.  . betamethasone valerate ointment (VALISONE) 0.1 % Apply 1 application topically 2 (two) times daily as needed (itching).  . Butalbital-APAP-Caffeine 50-325-40 MG capsule Take 1 capsule by mouth 3 (three) times daily as needed for headache.   . Cholecalciferol (VITAMIN D) 2000 units tablet Take 2,000 Units by mouth daily.  . citalopram (CELEXA) 20 MG tablet Take 20 mg by mouth daily.   . clonazePAM (KLONOPIN) 0.5 MG tablet Take 0.5 mg by mouth 2 (two) times daily as needed for anxiety.   . diphenhydrAMINE (BENADRYL) 25 MG tablet Take 25-50 mg by mouth 2 (two) times daily as needed for itching.   . fluticasone (CUTIVATE) 0.05 % cream Apply 1 application topically 2 (two) times daily as needed (rash). Applied to face and/or penis area(s)  . furosemide (LASIX) 40 MG tablet Take 120 mg by mouth daily.  Marland Kitchen gabapentin (NEURONTIN) 600 MG tablet Take 1,800 mg by mouth 2 (two) times daily.   Marland Kitchen guaifenesin (HUMIBID E) 400 MG TABS tablet Take 800 mg by mouth daily as needed (for post nasal drip/cough).   Marland Kitchen HYDROmorphone (DILAUDID) 4 MG tablet Take 4 mg by mouth 4 (four) times daily as needed for severe pain.   Marland Kitchen ibuprofen (ADVIL,MOTRIN) 200 MG tablet Take 600 mg by mouth 4 (four) times daily as needed for mild pain or moderate pain.    . isosorbide mononitrate (IMDUR) 120 MG 24 hr tablet Take 1 tablet (120 mg total) by mouth daily.  Marland Kitchen L-Lysine 500 MG TABS Take 500 mg by mouth daily.   Marland Kitchen loperamide (IMODIUM A-D) 2 MG tablet Take 6 mg by mouth 4 (four) times daily as needed for diarrhea or loose stools.  . metoprolol succinate (TOPROL-XL) 50 MG 24 hr tablet Take 1 tablet (50 mg total) by mouth daily. Take with or immediately following a meal.  . Multiple Vitamins-Minerals (MULTIVITAMIN WITH MINERALS) tablet Take 1 tablet by mouth every evening.   . nitroGLYCERIN (NITROSTAT) 0.4 MG SL tablet DISSOLVE 1 TABLET UNDER THE TONGUE EVERY 5 MINUTES AS NEEDED FOR CHEST PAIN. DO NOT EXCEED A TOTAL OF 3 DOSES IN 15 MINUTES  . NON FORMULARY at bedtime.  CPAP   . omeprazole (PRILOSEC) 40 MG capsule Take 40 mg by mouth daily before breakfast.   . polyethylene glycol (MIRALAX / GLYCOLAX) packet Take 17 g by mouth daily as needed for moderate constipation.  . polyvinyl alcohol-povidone (HYPOTEARS) 1.4-0.6 % ophthalmic solution Place 1-2 drops into both eyes daily as needed (dry eyes). For dry eyes  . potassium chloride (K-DUR) 10 MEQ tablet Take 10-20 mEq by mouth See admin instructions. 2 tablets in AM and 1 tablet in PM  . testosterone cypionate (DEPOTESTOSTERONE CYPIONATE) 200 MG/ML injection Inject 100 mg into the muscle every 14 (fourteen) days.   Marland Kitchen topiramate (TOPAMAX) 25 MG tablet Take 75 mg by mouth at bedtime.  . valACYclovir (VALTREX) 500 MG tablet Take 500 mg by mouth 2 (two) times daily as needed (fever blisters).   . vitamin C (ASCORBIC ACID) 500 MG tablet Take 500 mg by mouth every morning.      Allergies:   Bee venom, Duloxetine, Fentanyl, Lidocaine, Meperidine hcl, Methadone, Oxycodone hcl, Pregabalin, and Zolpidem tartrate   Social History   Tobacco Use  . Smoking status: Former Smoker    Packs/day: 2.50    Years: 32.00    Pack years: 80.00    Types: Cigarettes    Start date: 04/17/1967    Quit date: 05/12/2000    Years  since quitting: 19.1  . Smokeless tobacco: Never Used  Substance Use Topics  . Alcohol use: No    Alcohol/week: 0.0 standard drinks    Comment: RECOVERING ALCOHOLIC 9 YRS- [id number]  . Drug use: No     Family Hx: The patient's family history includes Heart disease in an other family member.  ROS:   Please see the history of present illness.     All other systems reviewed and are negative.   Prior CV studies:   The following studies were reviewed today:  Reviewed above  Labs/Other Tests and Data Reviewed:    EKG:  No ECG reviewed.  Recent Labs: No results found for requested labs within last 8760 hours.   Recent Lipid Panel Lab Results  Component Value Date/Time   CHOL 168 10/11/2013 03:57 AM   TRIG 172 (H) 10/11/2013 03:57 AM   HDL 34 (L) 10/11/2013 03:57 AM   CHOLHDL 4.9 10/11/2013 03:57 AM   LDLCALC 100 (H) 10/11/2013 03:57 AM    Wt Readings from Last 3 Encounters:  07/13/19 236 lb (107 kg)  12/07/18 260 lb (117.9 kg)  10/01/18 247 lb (112 kg)     Objective:    Vital Signs:  BP 124/76   Ht 5\' 8"  (1.727 m)   Wt 236 lb (107 kg)   SpO2 98%   BMI 35.88 kg/m    VITAL SIGNS:  reviewed  ASSESSMENT & PLAN:    1. Coronary artery disease: Coronary angiogram from June 2019 reviewed above. No significant changes from 2016.Continue aspirin, Lipitor, Imdur, and Toprol-XL. Symptomatically stable.  2. Hypertension: Controlled on present therapy.  No changes.  3. Hyperlipidemia: Continue Lipitor 40 mg.  LDL 67 on 09/24/2018.  4. Chronic diastolic heart failure: Compensated on Lasix 40 mg 3 times daily. He takes supplemental potassium as well. No changes.  5. OSA: On CPAP.  6. Preoperative risk stratification: He can proceed with left shoulder surgery with an acceptable level of risk.     COVID-19 Education: The signs and symptoms of COVID-19 were discussed with the patient and how to seek care for testing (follow up with PCP or arrange E-visit).  The importance of social distancing was discussed today.  Time:   Today, I have spent 15 minutes with the patient with telehealth technology discussing the above problems.     Medication Adjustments/Labs and Tests Ordered: Current medicines are reviewed at length with the patient today.  Concerns regarding medicines are outlined above.   Tests Ordered: No orders of the defined types were placed in this encounter.   Medication Changes: No orders of the defined types were placed in this encounter.   Follow Up:  In Person in 6 month(s)  Signed, Kate Sable, MD  07/13/2019 10:00 AM    [place]

## 2019-07-22 DIAGNOSIS — I110 Hypertensive heart disease with heart failure: Secondary | ICD-10-CM | POA: Diagnosis not present

## 2019-07-22 DIAGNOSIS — S43492A Other sprain of left shoulder joint, initial encounter: Secondary | ICD-10-CM | POA: Diagnosis not present

## 2019-07-22 DIAGNOSIS — G629 Polyneuropathy, unspecified: Secondary | ICD-10-CM | POA: Diagnosis not present

## 2019-07-22 DIAGNOSIS — E785 Hyperlipidemia, unspecified: Secondary | ICD-10-CM | POA: Diagnosis not present

## 2019-07-22 DIAGNOSIS — M7522 Bicipital tendinitis, left shoulder: Secondary | ICD-10-CM | POA: Diagnosis not present

## 2019-07-22 DIAGNOSIS — M75122 Complete rotator cuff tear or rupture of left shoulder, not specified as traumatic: Secondary | ICD-10-CM | POA: Diagnosis not present

## 2019-07-22 DIAGNOSIS — M797 Fibromyalgia: Secondary | ICD-10-CM | POA: Diagnosis not present

## 2019-07-22 DIAGNOSIS — Z01818 Encounter for other preprocedural examination: Secondary | ICD-10-CM | POA: Diagnosis not present

## 2019-07-22 DIAGNOSIS — M19012 Primary osteoarthritis, left shoulder: Secondary | ICD-10-CM | POA: Diagnosis not present

## 2019-07-22 DIAGNOSIS — I509 Heart failure, unspecified: Secondary | ICD-10-CM | POA: Diagnosis not present

## 2019-07-26 DIAGNOSIS — M7522 Bicipital tendinitis, left shoulder: Secondary | ICD-10-CM | POA: Diagnosis not present

## 2019-07-26 DIAGNOSIS — G629 Polyneuropathy, unspecified: Secondary | ICD-10-CM | POA: Diagnosis not present

## 2019-07-26 DIAGNOSIS — M75102 Unspecified rotator cuff tear or rupture of left shoulder, not specified as traumatic: Secondary | ICD-10-CM | POA: Diagnosis not present

## 2019-07-26 DIAGNOSIS — G4730 Sleep apnea, unspecified: Secondary | ICD-10-CM | POA: Diagnosis not present

## 2019-07-26 DIAGNOSIS — S43492A Other sprain of left shoulder joint, initial encounter: Secondary | ICD-10-CM | POA: Diagnosis not present

## 2019-07-26 DIAGNOSIS — I110 Hypertensive heart disease with heart failure: Secondary | ICD-10-CM | POA: Diagnosis not present

## 2019-07-26 DIAGNOSIS — S46812A Strain of other muscles, fascia and tendons at shoulder and upper arm level, left arm, initial encounter: Secondary | ICD-10-CM | POA: Diagnosis not present

## 2019-07-26 DIAGNOSIS — E669 Obesity, unspecified: Secondary | ICD-10-CM | POA: Diagnosis not present

## 2019-07-26 DIAGNOSIS — I509 Heart failure, unspecified: Secondary | ICD-10-CM | POA: Diagnosis not present

## 2019-07-26 DIAGNOSIS — E785 Hyperlipidemia, unspecified: Secondary | ICD-10-CM | POA: Diagnosis not present

## 2019-07-26 DIAGNOSIS — M797 Fibromyalgia: Secondary | ICD-10-CM | POA: Diagnosis not present

## 2019-07-26 DIAGNOSIS — M19012 Primary osteoarthritis, left shoulder: Secondary | ICD-10-CM | POA: Diagnosis not present

## 2019-07-26 DIAGNOSIS — M75122 Complete rotator cuff tear or rupture of left shoulder, not specified as traumatic: Secondary | ICD-10-CM | POA: Diagnosis not present

## 2019-07-26 DIAGNOSIS — G8918 Other acute postprocedural pain: Secondary | ICD-10-CM | POA: Diagnosis not present

## 2019-08-01 DIAGNOSIS — Z4789 Encounter for other orthopedic aftercare: Secondary | ICD-10-CM | POA: Diagnosis not present

## 2019-08-10 ENCOUNTER — Other Ambulatory Visit: Payer: Self-pay | Admitting: Cardiovascular Disease

## 2019-08-10 MED ORDER — ATORVASTATIN CALCIUM 40 MG PO TABS: ORAL_TABLET | ORAL | 3 refills | Status: DC

## 2019-08-10 NOTE — Telephone Encounter (Signed)
*   1. Which medications need to be refilled? (please list name of each medication and dose if known)  atorvastatin (LIPITOR) 40 MG tablet    2. Which pharmacy/location (including street and city if local pharmacy) is medication to be sent to?  [facility]   3. Do they need a 30 day or 90 day supply? 90   Patient will run out of medication this week.

## 2019-08-15 DIAGNOSIS — G4733 Obstructive sleep apnea (adult) (pediatric): Secondary | ICD-10-CM | POA: Diagnosis not present

## 2019-08-29 DIAGNOSIS — M25562 Pain in left knee: Secondary | ICD-10-CM | POA: Diagnosis not present

## 2019-08-29 DIAGNOSIS — M12812 Other specific arthropathies, not elsewhere classified, left shoulder: Secondary | ICD-10-CM | POA: Diagnosis not present

## 2019-08-29 DIAGNOSIS — M12811 Other specific arthropathies, not elsewhere classified, right shoulder: Secondary | ICD-10-CM | POA: Diagnosis not present

## 2019-09-19 DIAGNOSIS — M542 Cervicalgia: Secondary | ICD-10-CM | POA: Diagnosis not present

## 2019-09-19 DIAGNOSIS — E1142 Type 2 diabetes mellitus with diabetic polyneuropathy: Secondary | ICD-10-CM | POA: Diagnosis not present

## 2019-09-19 DIAGNOSIS — Z79891 Long term (current) use of opiate analgesic: Secondary | ICD-10-CM | POA: Diagnosis not present

## 2019-09-19 DIAGNOSIS — M79606 Pain in leg, unspecified: Secondary | ICD-10-CM | POA: Diagnosis not present

## 2019-09-19 DIAGNOSIS — R2689 Other abnormalities of gait and mobility: Secondary | ICD-10-CM | POA: Diagnosis not present

## 2019-09-19 DIAGNOSIS — G4489 Other headache syndrome: Secondary | ICD-10-CM | POA: Diagnosis not present

## 2019-09-19 DIAGNOSIS — M79643 Pain in unspecified hand: Secondary | ICD-10-CM | POA: Diagnosis not present

## 2019-09-19 DIAGNOSIS — M545 Low back pain: Secondary | ICD-10-CM | POA: Diagnosis not present

## 2019-09-19 DIAGNOSIS — M5416 Radiculopathy, lumbar region: Secondary | ICD-10-CM | POA: Diagnosis not present

## 2019-10-05 DIAGNOSIS — I70203 Unspecified atherosclerosis of native arteries of extremities, bilateral legs: Secondary | ICD-10-CM | POA: Diagnosis not present

## 2019-10-05 DIAGNOSIS — L84 Corns and callosities: Secondary | ICD-10-CM | POA: Diagnosis not present

## 2019-10-05 DIAGNOSIS — M79676 Pain in unspecified toe(s): Secondary | ICD-10-CM | POA: Diagnosis not present

## 2019-10-05 DIAGNOSIS — B351 Tinea unguium: Secondary | ICD-10-CM | POA: Diagnosis not present

## 2019-10-14 DIAGNOSIS — Z13220 Encounter for screening for lipoid disorders: Secondary | ICD-10-CM | POA: Diagnosis not present

## 2019-10-14 DIAGNOSIS — K219 Gastro-esophageal reflux disease without esophagitis: Secondary | ICD-10-CM | POA: Diagnosis not present

## 2019-10-14 DIAGNOSIS — I10 Essential (primary) hypertension: Secondary | ICD-10-CM | POA: Diagnosis not present

## 2019-10-14 DIAGNOSIS — R739 Hyperglycemia, unspecified: Secondary | ICD-10-CM | POA: Diagnosis not present

## 2019-10-18 DIAGNOSIS — N481 Balanitis: Secondary | ICD-10-CM | POA: Diagnosis not present

## 2019-10-18 DIAGNOSIS — I2510 Atherosclerotic heart disease of native coronary artery without angina pectoris: Secondary | ICD-10-CM | POA: Diagnosis not present

## 2019-10-18 DIAGNOSIS — Z1389 Encounter for screening for other disorder: Secondary | ICD-10-CM | POA: Diagnosis not present

## 2019-10-18 DIAGNOSIS — I5032 Chronic diastolic (congestive) heart failure: Secondary | ICD-10-CM | POA: Diagnosis not present

## 2019-10-18 DIAGNOSIS — J449 Chronic obstructive pulmonary disease, unspecified: Secondary | ICD-10-CM | POA: Diagnosis not present

## 2019-10-18 DIAGNOSIS — Z1331 Encounter for screening for depression: Secondary | ICD-10-CM | POA: Diagnosis not present

## 2019-10-18 DIAGNOSIS — I10 Essential (primary) hypertension: Secondary | ICD-10-CM | POA: Diagnosis not present

## 2019-10-18 DIAGNOSIS — Z6838 Body mass index (BMI) 38.0-38.9, adult: Secondary | ICD-10-CM | POA: Diagnosis not present

## 2019-10-18 DIAGNOSIS — K219 Gastro-esophageal reflux disease without esophagitis: Secondary | ICD-10-CM | POA: Diagnosis not present

## 2019-10-18 DIAGNOSIS — R739 Hyperglycemia, unspecified: Secondary | ICD-10-CM | POA: Diagnosis not present

## 2019-10-18 DIAGNOSIS — E291 Testicular hypofunction: Secondary | ICD-10-CM | POA: Diagnosis not present

## 2019-10-18 DIAGNOSIS — F419 Anxiety disorder, unspecified: Secondary | ICD-10-CM | POA: Diagnosis not present

## 2019-11-04 DIAGNOSIS — G4733 Obstructive sleep apnea (adult) (pediatric): Secondary | ICD-10-CM | POA: Diagnosis not present

## 2019-11-09 DIAGNOSIS — I5032 Chronic diastolic (congestive) heart failure: Secondary | ICD-10-CM | POA: Diagnosis not present

## 2019-11-09 DIAGNOSIS — M797 Fibromyalgia: Secondary | ICD-10-CM | POA: Diagnosis not present

## 2019-11-09 DIAGNOSIS — I110 Hypertensive heart disease with heart failure: Secondary | ICD-10-CM | POA: Diagnosis not present

## 2019-11-09 DIAGNOSIS — I2510 Atherosclerotic heart disease of native coronary artery without angina pectoris: Secondary | ICD-10-CM | POA: Diagnosis not present

## 2019-11-30 DIAGNOSIS — R05 Cough: Secondary | ICD-10-CM | POA: Diagnosis not present

## 2019-11-30 DIAGNOSIS — J22 Unspecified acute lower respiratory infection: Secondary | ICD-10-CM | POA: Diagnosis not present

## 2019-12-09 DIAGNOSIS — I2510 Atherosclerotic heart disease of native coronary artery without angina pectoris: Secondary | ICD-10-CM | POA: Diagnosis not present

## 2019-12-09 DIAGNOSIS — I110 Hypertensive heart disease with heart failure: Secondary | ICD-10-CM | POA: Diagnosis not present

## 2019-12-09 DIAGNOSIS — M797 Fibromyalgia: Secondary | ICD-10-CM | POA: Diagnosis not present

## 2019-12-09 DIAGNOSIS — I5032 Chronic diastolic (congestive) heart failure: Secondary | ICD-10-CM | POA: Diagnosis not present

## 2019-12-12 DIAGNOSIS — R2689 Other abnormalities of gait and mobility: Secondary | ICD-10-CM | POA: Diagnosis not present

## 2019-12-12 DIAGNOSIS — M5416 Radiculopathy, lumbar region: Secondary | ICD-10-CM | POA: Diagnosis not present

## 2019-12-12 DIAGNOSIS — M545 Low back pain: Secondary | ICD-10-CM | POA: Diagnosis not present

## 2019-12-12 DIAGNOSIS — M542 Cervicalgia: Secondary | ICD-10-CM | POA: Diagnosis not present

## 2019-12-12 DIAGNOSIS — M79643 Pain in unspecified hand: Secondary | ICD-10-CM | POA: Diagnosis not present

## 2019-12-12 DIAGNOSIS — G4489 Other headache syndrome: Secondary | ICD-10-CM | POA: Diagnosis not present

## 2019-12-12 DIAGNOSIS — Z79891 Long term (current) use of opiate analgesic: Secondary | ICD-10-CM | POA: Diagnosis not present

## 2019-12-12 DIAGNOSIS — E1142 Type 2 diabetes mellitus with diabetic polyneuropathy: Secondary | ICD-10-CM | POA: Diagnosis not present

## 2019-12-12 DIAGNOSIS — M79606 Pain in leg, unspecified: Secondary | ICD-10-CM | POA: Diagnosis not present

## 2020-01-09 ENCOUNTER — Other Ambulatory Visit: Payer: Self-pay

## 2020-01-09 MED ORDER — ISOSORBIDE MONONITRATE ER 120 MG PO TB24: 120.0000 mg | ORAL_TABLET | Freq: Every day | ORAL | 3 refills | Status: DC

## 2020-01-09 NOTE — Telephone Encounter (Signed)
Refilled imdur 120 mg

## 2020-01-18 ENCOUNTER — Ambulatory Visit: Payer: Medicare PPO | Admitting: Cardiovascular Disease

## 2020-01-25 DIAGNOSIS — L84 Corns and callosities: Secondary | ICD-10-CM | POA: Diagnosis not present

## 2020-01-25 DIAGNOSIS — M79676 Pain in unspecified toe(s): Secondary | ICD-10-CM | POA: Diagnosis not present

## 2020-01-25 DIAGNOSIS — G609 Hereditary and idiopathic neuropathy, unspecified: Secondary | ICD-10-CM | POA: Diagnosis not present

## 2020-01-25 DIAGNOSIS — G4733 Obstructive sleep apnea (adult) (pediatric): Secondary | ICD-10-CM | POA: Diagnosis not present

## 2020-01-25 DIAGNOSIS — B351 Tinea unguium: Secondary | ICD-10-CM | POA: Diagnosis not present

## 2020-02-01 ENCOUNTER — Other Ambulatory Visit: Payer: Self-pay

## 2020-02-01 ENCOUNTER — Ambulatory Visit: Payer: Medicare PPO | Admitting: Student

## 2020-02-01 ENCOUNTER — Encounter: Payer: Self-pay | Admitting: *Deleted

## 2020-02-01 ENCOUNTER — Encounter: Payer: Self-pay | Admitting: Student

## 2020-02-01 VITALS — BP 126/70 | HR 61 | Ht 68.0 in | Wt 243.8 lb

## 2020-02-01 DIAGNOSIS — I10 Essential (primary) hypertension: Secondary | ICD-10-CM | POA: Diagnosis not present

## 2020-02-01 DIAGNOSIS — R600 Localized edema: Secondary | ICD-10-CM

## 2020-02-01 DIAGNOSIS — G4733 Obstructive sleep apnea (adult) (pediatric): Secondary | ICD-10-CM

## 2020-02-01 DIAGNOSIS — I25118 Atherosclerotic heart disease of native coronary artery with other forms of angina pectoris: Secondary | ICD-10-CM

## 2020-02-01 DIAGNOSIS — E785 Hyperlipidemia, unspecified: Secondary | ICD-10-CM | POA: Diagnosis not present

## 2020-02-01 DIAGNOSIS — Z9989 Dependence on other enabling machines and devices: Secondary | ICD-10-CM

## 2020-02-01 NOTE — Patient Instructions (Signed)
Medication Instructions:  Your physician recommends that you continue on your current medications as directed. Please refer to the Current Medication list given to you today.  *If you need a refill on your cardiac medications before your next appointment, please call your pharmacy*   Lab Work: NONE   If you have labs (blood work) drawn today and your tests are completely normal, you will receive your results only by:  [place] (if you have MyChart) OR  A paper copy in the mail If you have any lab test that is abnormal or we need to change your treatment, we will call you to review the results.   Testing/Procedures: NONE    Follow-Up: At [facility], you and your health needs are our priority.  As part of our continuing mission to provide you with exceptional heart care, we have created designated Provider Care Teams.  These Care Teams include your primary Cardiologist (physician) and Advanced Practice Providers (APPs -  Physician Assistants and Nurse Practitioners) who all work together to provide you with the care you need, when you need it.  We recommend signing up for the patient portal called "MyChart".  Sign up information is provided on this After Visit Summary.  MyChart is used to connect with patients for Virtual Visits (Telemedicine).  Patients are able to view lab/test results, encounter notes, upcoming appointments, etc.  Non-urgent messages can be sent to your provider as well.   To learn more about what you can do with MyChart, go to [email or web address].    Your next appointment:   6 month(s)  The format for your next appointment:   In Person  Provider:   Bernerd Pho, PA-C   Other Instructions Thank you for choosing [facility]!

## 2020-02-01 NOTE — Progress Notes (Signed)
Cardiology Office Note    Date:  02/01/2020   ID:  [name], DOB [birth year], MRN [id number]  PCP:  Rory Percy, MD  Cardiologist: Kate Sable, MD (Inactive) --> Will switch to Dr. Domenic Polite  Chief Complaint  Patient presents with   Follow-up    6 month visit    History of Present Illness:    [name] is a 69 y.o. male with past medical history of CAD (s/p DES to LAD and PTCA of D1 in 2010, cath in 2016 showing patent stent with 50% stenosis of jailed D1, cath in 10/2017 showing patent LAD stent with 70% D1 stenosis and 80% RV branch stenosis and medical management recommended), HTN, HLD, OSA and chronic diastolic CHF who presents to the office today for 18-month follow-up.  He most recently had a telehealth visit with Dr. Bronson Ing in 07/2019 and denied any recent chest pain since Imdur had been increased to 120 mg daily. He had been diagnosed with COVID-19 and reported symptoms had lasted for 4 days but denied any residual side effects. He was continued on his current medication regimen and informed to follow-up in 6 months.  In talking with the patient today, he reports overall doing well since his last visit. He did have some episodes of angina last month which resolved with SL NTG x1. He feels like this was secondary to financial stressors and also due to being the primary caregiver for his wife who has been experiencing significant hip pain and is being followed by Orthopedics. Since his stress has improved, he denies any recurrent episodes of pain. No recent dyspnea on exertion, orthopnea, PND or palpitations. He does experience lower extremity edema and has remained on high-dose Lasix 120 mg daily as he experienced worsening symptoms with a lower dose in the past.  He did have lab work by his PCP in 10/2019.   Past Medical History:  Diagnosis Date   Anxiety    HX PANIC ATTACKS   CHF (congestive heart failure) (HCC)    Chronic cough    Coronary artery  disease    a. s/p DES to LAD and PTCA of D1 in 2010 b. cath in 2016 showing patent stent with 50% stenosis of jailed D1 c. cath in 10/2017 showing patent LAD stent with 70% D1 stenosis and 80% RV branch stenosis and medical management recommended   Dry eyes    Fibromyalgia    Fluid retention    GERD (gastroesophageal reflux disease)    Headache(784.0)    Herpes genitalis in men    History of kidney stones    History of vertigo    Hypertension    Narcolepsy    Neuropathy    Penile lesion    Sjogren's disease ([place])    Sleep apnea    USES C-PAP    Past Surgical History:  Procedure Laterality Date   BREAST SURGERY     BIL MASTECTOMY  FOR BENIGN LUMPS   CARDIAC CATHETERIZATION N/A 01/09/2015   Procedure: Left Heart Cath and Coronary Angiography;  Surgeon: Leonie Man, MD;  Location: [facility];  Service: Cardiovascular;  Laterality: N/A;   CARPAL TUNNELL  BIL   CIRCUMCISION     CORONARY ANGIOPLASTY WITH STENT PLACEMENT  X1 2010   HERNIA REPAIR     ING HERNIA REPAIR   LEFT HEART CATH AND CORONARY ANGIOGRAPHY N/A 10/15/2017   Procedure: LEFT HEART CATH AND CORONARY ANGIOGRAPHY;  Surgeon: Martinique, Peter M, MD;  Location:  [facility];  Service: Cardiovascular;  Laterality: N/A;   LEFT HEART CATHETERIZATION WITH CORONARY ANGIOGRAM N/A 10/11/2013   Procedure: LEFT HEART CATHETERIZATION WITH CORONARY ANGIOGRAM;  Surgeon: Sinclair Grooms, MD;  Location: [facility];  Service: Cardiovascular;  Laterality: N/A;   LEFT HEART CATHETERIZATION WITH CORONARY ANGIOGRAM N/A 06/21/2014   Procedure: LEFT HEART CATHETERIZATION WITH CORONARY ANGIOGRAM;  Surgeon: Troy Sine, MD;  Location: [facility] CATH LAB;  Service: Cardiovascular;  Laterality: N/A;   MEATOTOMY  X2   PERCUTANEOUS CORONARY STENT INTERVENTION (PCI-S) N/A 06/23/2014   Procedure: PERCUTANEOUS CORONARY STENT INTERVENTION (PCI-S);  Surgeon: Jettie Booze, MD;  Location: [facility] CATH LAB;  Service:  Cardiovascular;  Laterality: N/A;   VASECTOMY     WRIST FRACTURE SURGERY  L  X3 SURGERIES    Current Medications: Outpatient Medications Prior to Visit  Medication Sig Dispense Refill   aspirin EC 81 MG tablet Take 81 mg by mouth at bedtime.      atorvastatin (LIPITOR) 40 MG tablet Take 40 mg by mouth once daily in the evening 90 tablet 3   b complex vitamins tablet Take 1 tablet by mouth daily.     betamethasone valerate ointment (VALISONE) 0.1 % Apply 1 application topically 2 (two) times daily as needed (itching).     Butalbital-APAP-Caffeine 50-325-40 MG capsule Take 1 capsule by mouth 3 (three) times daily as needed for headache.      Cholecalciferol (VITAMIN D) 2000 units tablet Take 2,000 Units by mouth daily.     citalopram (CELEXA) 20 MG tablet Take 20 mg by mouth daily.      clonazePAM (KLONOPIN) 0.5 MG tablet Take 0.5 mg by mouth 2 (two) times daily as needed for anxiety.      diphenhydrAMINE (BENADRYL) 25 MG tablet Take 25-50 mg by mouth 2 (two) times daily as needed for itching.      fluticasone (CUTIVATE) 0.05 % cream Apply 1 application topically 2 (two) times daily as needed (rash). Applied to face and/or penis area(s)     furosemide (LASIX) 40 MG tablet Take 120 mg by mouth daily.     gabapentin (NEURONTIN) 600 MG tablet Take 1,800 mg by mouth 2 (two) times daily.      guaifenesin (HUMIBID E) 400 MG TABS tablet Take 800 mg by mouth daily as needed (for post nasal drip/cough).      HYDROmorphone (DILAUDID) 4 MG tablet Take 4 mg by mouth 4 (four) times daily as needed for severe pain.      ibuprofen (ADVIL,MOTRIN) 200 MG tablet Take 600 mg by mouth 4 (four) times daily as needed for mild pain or moderate pain.      isosorbide mononitrate (IMDUR) 120 MG 24 hr tablet Take 1 tablet (120 mg total) by mouth daily. 90 tablet 3   L-Lysine 500 MG TABS Take 500 mg by mouth daily.      loperamide (IMODIUM A-D) 2 MG tablet Take 6 mg by mouth 4 (four) times daily as  needed for diarrhea or loose stools.     Multiple Vitamins-Minerals (MULTIVITAMIN WITH MINERALS) tablet Take 1 tablet by mouth every evening.      nitroGLYCERIN (NITROSTAT) 0.4 MG SL tablet DISSOLVE 1 TABLET UNDER THE TONGUE EVERY 5 MINUTES AS NEEDED FOR CHEST PAIN. DO NOT EXCEED A TOTAL OF 3 DOSES IN 15 MINUTES 75 tablet 0   NON FORMULARY at bedtime. CPAP      omeprazole (PRILOSEC) 40 MG capsule Take 40 mg by mouth  daily before breakfast.      polyethylene glycol (MIRALAX / GLYCOLAX) packet Take 17 g by mouth daily as needed for moderate constipation.     polyvinyl alcohol-povidone (HYPOTEARS) 1.4-0.6 % ophthalmic solution Place 1-2 drops into both eyes daily as needed (dry eyes). For dry eyes     potassium chloride (K-DUR) 10 MEQ tablet Take 10-20 mEq by mouth See admin instructions. 2 tablets in AM and 1 tablet in PM     testosterone cypionate (DEPOTESTOSTERONE CYPIONATE) 200 MG/ML injection Inject 100 mg into the muscle every 14 (fourteen) days.      topiramate (TOPAMAX) 25 MG tablet Take 75 mg by mouth at bedtime.     valACYclovir (VALTREX) 500 MG tablet Take 500 mg by mouth 2 (two) times daily as needed (fever blisters).      vitamin C (ASCORBIC ACID) 500 MG tablet Take 500 mg by mouth every morning.      metoprolol succinate (TOPROL-XL) 50 MG 24 hr tablet Take 1 tablet (50 mg total) by mouth daily. Take with or immediately following a meal. 90 tablet 3   No facility-administered medications prior to visit.     Allergies:   Bee venom, Duloxetine, Fentanyl, Lidocaine, Meperidine hcl, Methadone, Oxycodone hcl, Pregabalin, and Zolpidem tartrate   Social History   Socioeconomic History   Marital status: Married    Spouse name: Not on file   Number of children: Not on file   Years of education: Not on file   Highest education level: Not on file  Occupational History   Not on file  Tobacco Use   Smoking status: Former Smoker    Packs/day: 2.50    Years: 32.00     Pack years: 80.00    Types: Cigarettes    Start date: 04/17/1967    Quit date: 05/12/2000    Years since quitting: 19.7   Smokeless tobacco: Never Used  Vaping Use   Vaping Use: Never used  Substance and Sexual Activity   Alcohol use: No    Alcohol/week: 0.0 standard drinks    Comment: RECOVERING ALCOHOLIC 9 YRS- 5188   Drug use: No   Sexual activity: Not on file  Other Topics Concern   Not on file  Social History Narrative   Not on file   Social Determinants of Health   Financial Resource Strain:    Difficulty of Paying Living Expenses: Not on file  Food Insecurity:    Worried About [place] in the Last Year: Not on file   [employer] of Food in the Last Year: Not on file  Transportation Needs:    Lack of Transportation (Medical): Not on file   Lack of Transportation (Non-Medical): Not on file  Physical Activity:    Days of Exercise per Week: Not on file   Minutes of Exercise per Session: Not on file  Stress:    Feeling of Stress : Not on file  Social Connections:    Frequency of Communication with Friends and Family: Not on file   Frequency of Social Gatherings with Friends and Family: Not on file   Attends Religious Services: Not on file   Active Member of Clubs or Organizations: Not on file   Attends Archivist Meetings: Not on file   Marital Status: Not on file     Family History:  The patient's family history includes Heart disease in an other family member.   Review of Systems:   Please see the history of present  illness.     General:  No chills, fever, night sweats or weight changes.  Cardiovascular:  No dyspnea on exertion, edema, orthopnea, palpitations, paroxysmal nocturnal dyspnea. Positive for chest pain (now resolved).  Dermatological: No rash, lesions/masses Respiratory: No cough, dyspnea Urologic: No hematuria, dysuria Abdominal:   No nausea, vomiting, diarrhea, bright red blood per rectum, melena, or  hematemesis Neurologic:  No visual changes, wkns, changes in mental status. All other systems reviewed and are otherwise negative except as noted above.   Physical Exam:    VS:  BP 126/70    Pulse 61    Ht 5\' 8"  (1.727 m)    Wt 243 lb 12.8 oz (110.6 kg)    SpO2 99%    BMI 37.07 kg/m    General: Well developed, well nourished,male appearing in no acute distress. Head: Normocephalic, atraumatic. Neck: No carotid bruits. JVD not elevated.  Lungs: Respirations regular and unlabored, without wheezes or rales.  Heart: Regular rate and rhythm. No S3 or S4.  No murmur, no rubs, or gallops appreciated. Abdomen: Appears non-distended. No obvious abdominal masses. Msk:  Strength and tone appear normal for age. No obvious joint deformities or effusions. Extremities: No clubbing or cyanosis. Trace lower extremity edema bilaterally. Varicose veins noted.  Distal pedal pulses are 2+ bilaterally. Neuro: Alert and oriented X 3. Moves all extremities spontaneously. No focal deficits noted. Psych:  Responds to questions appropriately with a normal affect. Skin: No rashes or lesions noted  Wt Readings from Last 3 Encounters:  02/01/20 243 lb 12.8 oz (110.6 kg)  07/13/19 236 lb (107 kg)  12/07/18 260 lb (117.9 kg)      Studies/Labs Reviewed:   EKG:  EKG is ordered today.  The ekg ordered today demonstrates NSR, HR 60 with LAD. No acute ST changes when compared to prior tracings.   Recent Labs: No results found for requested labs within last 8760 hours.   Lipid Panel    Component Value Date/Time   CHOL 168 10/11/2013 0357   TRIG 172 (H) 10/11/2013 0357   HDL 34 (L) 10/11/2013 0357   CHOLHDL 4.9 10/11/2013 0357   VLDL 34 10/11/2013 0357   LDLCALC 100 (H) 10/11/2013 0357    Additional studies/ records that were reviewed today include:   Echocardiogram: 12/2014 Study Conclusions   - Left ventricle: The cavity size was normal. Wall thickness was  increased in a pattern of mild LVH.  Systolic function was normal.  The estimated ejection fraction was in the range of 60% to 65%.  Wall motion was normal; there were no regional wall motion  abnormalities. Left ventricular diastolic function parameters  were normal.  - Aortic valve: Mildly calcified annulus. Trileaflet; mildly  thickened leaflets. Valve area (VTI): 2.64 cm^2. Valve area  (Vmax): 2.8 cm^2.  - Mitral valve: Mildly calcified annulus. Mildly thickened leaflets  .  - Left atrium: The atrium was moderately dilated.  - Right atrium: The atrium was mildly dilated.  - Technically adequate study.   Cardiac Catheterization: 10/2017  Ost 1st Diag to 1st Diag lesion is 70% stenosed.  Ost 2nd Diag lesion is 50% stenosed.  Acute Mrg lesion is 80% stenosed.  Previously placed Prox LAD to Mid LAD stent (unknown type) is widely patent.  The left ventricular systolic function is normal.  LV end diastolic pressure is normal.  The left ventricular ejection fraction is 55-65% by visual estimate.   1. Small vessel obstructive CAD with 70% ostial first diagonal ( jailed by stent)  and 80% ostial RV marginal branch 2. Continued patency of stents in the LAD 3. Normal LV function 4. Normal LVEDP  Plan: really no change from last cardiac cath in August 2016. Recommend continued medical therapy.   Assessment:    1. Coronary artery disease of native artery of native heart with stable angina pectoris ([place])   2. Bilateral lower extremity edema   3. Essential hypertension   4. Hyperlipidemia LDL goal <70   5. OSA on CPAP      Plan:   In order of problems listed above:  1. CAD - He is s/p DES to LAD and PTCA of D1 in 2010 with most recent cath in 10/2017 showing patent LAD stent with 70% D1 stenosis and 80% RV branch stenosis and medical management recommended at that time.  - He does report intermittent episodes of chest pain last month with would occur at rest and resolved with sublingual  nitroglycerin x1. Reports being under increased stress at that time and symptoms have spontaneously resolved since. EKG today is without acute ST abnormalities. Will continue current medical therapy with ASA 81 mg daily, Atorvastatin 40 mg daily, Imdur 120 mg daily and Toprol-XL 50 mg daily. We reviewed that Imdur could be further titrated in the future if he has recurrent angina.  2. Bilateral Lower Extremity Edema - He has a history of chronic lower extremity edema which is likely secondary to venous insufficiency. He remains on Lasix 120 mg daily as he experienced progressive symptoms with lower dosing in the past. He did have repeat labs by his PCP in 10/2019 and I will request a copy of these.  3. HTN - BP is well controlled at 126/70 during today's visit. Continue current medication regimen.  4. HLD - Followed by his PCP.  LDL was at 67 in 09/2018 which is at goal of less than 70. Will request a copy of recent labs from his PCP. Continue Atorvastatin 40 mg daily.  5. OSA - Continued compliance with CPAP encouraged.    Medication Adjustments/Labs and Tests Ordered: Current medicines are reviewed at length with the patient today.  Concerns regarding medicines are outlined above.  Medication changes, Labs and Tests ordered today are listed in the Patient Instructions below. Patient Instructions  Medication Instructions:  Your physician recommends that you continue on your current medications as directed. Please refer to the Current Medication list given to you today.  *If you need a refill on your cardiac medications before your next appointment, please call your pharmacy*   Lab Work: NONE   If you have labs (blood work) drawn today and your tests are completely normal, you will receive your results only by:  [place] (if you have MyChart) OR  A paper copy in the mail If you have any lab test that is abnormal or we need to change your treatment, we will call you to review  the results.   Testing/Procedures: NONE    Follow-Up: At [facility], you and your health needs are our priority.  As part of our continuing mission to provide you with exceptional heart care, we have created designated Provider Care Teams.  These Care Teams include your primary Cardiologist (physician) and Advanced Practice Providers (APPs -  Physician Assistants and Nurse Practitioners) who all work together to provide you with the care you need, when you need it.  We recommend signing up for the patient portal called "MyChart".  Sign up information is provided on this After Visit Summary.  MyChart is used to connect with patients for Virtual Visits (Telemedicine).  Patients are able to view lab/test results, encounter notes, upcoming appointments, etc.  Non-urgent messages can be sent to your provider as well.   To learn more about what you can do with MyChart, go to [email or web address].    Your next appointment:   6 month(s)  The format for your next appointment:   In Person  Provider:   Bernerd Pho, PA-C   Other Instructions Thank you for choosing [facility]!    Signed, Erma Heritage, PA-C  02/01/2020 5:01 PM    [place] [address] [place], [place] [address] Phone: [phone] Fax: [phone]

## 2020-02-07 DIAGNOSIS — M5134 Other intervertebral disc degeneration, thoracic region: Secondary | ICD-10-CM | POA: Diagnosis not present

## 2020-02-07 DIAGNOSIS — M5030 Other cervical disc degeneration, unspecified cervical region: Secondary | ICD-10-CM | POA: Diagnosis not present

## 2020-02-07 DIAGNOSIS — M9901 Segmental and somatic dysfunction of cervical region: Secondary | ICD-10-CM | POA: Diagnosis not present

## 2020-02-07 DIAGNOSIS — M9902 Segmental and somatic dysfunction of thoracic region: Secondary | ICD-10-CM | POA: Diagnosis not present

## 2020-02-09 DIAGNOSIS — M5030 Other cervical disc degeneration, unspecified cervical region: Secondary | ICD-10-CM | POA: Diagnosis not present

## 2020-02-09 DIAGNOSIS — S46091A Other injury of muscle(s) and tendon(s) of the rotator cuff of right shoulder, initial encounter: Secondary | ICD-10-CM | POA: Diagnosis not present

## 2020-02-09 DIAGNOSIS — I2510 Atherosclerotic heart disease of native coronary artery without angina pectoris: Secondary | ICD-10-CM | POA: Diagnosis not present

## 2020-02-09 DIAGNOSIS — M62838 Other muscle spasm: Secondary | ICD-10-CM | POA: Diagnosis not present

## 2020-02-09 DIAGNOSIS — M542 Cervicalgia: Secondary | ICD-10-CM | POA: Diagnosis not present

## 2020-02-09 DIAGNOSIS — I110 Hypertensive heart disease with heart failure: Secondary | ICD-10-CM | POA: Diagnosis not present

## 2020-02-09 DIAGNOSIS — M9902 Segmental and somatic dysfunction of thoracic region: Secondary | ICD-10-CM | POA: Diagnosis not present

## 2020-02-09 DIAGNOSIS — I5032 Chronic diastolic (congestive) heart failure: Secondary | ICD-10-CM | POA: Diagnosis not present

## 2020-02-09 DIAGNOSIS — M5134 Other intervertebral disc degeneration, thoracic region: Secondary | ICD-10-CM | POA: Diagnosis not present

## 2020-02-09 DIAGNOSIS — R600 Localized edema: Secondary | ICD-10-CM | POA: Diagnosis not present

## 2020-02-09 DIAGNOSIS — S46092A Other injury of muscle(s) and tendon(s) of the rotator cuff of left shoulder, initial encounter: Secondary | ICD-10-CM | POA: Diagnosis not present

## 2020-02-09 DIAGNOSIS — M9901 Segmental and somatic dysfunction of cervical region: Secondary | ICD-10-CM | POA: Diagnosis not present

## 2020-02-09 DIAGNOSIS — M797 Fibromyalgia: Secondary | ICD-10-CM | POA: Diagnosis not present

## 2020-02-14 DIAGNOSIS — M9902 Segmental and somatic dysfunction of thoracic region: Secondary | ICD-10-CM | POA: Diagnosis not present

## 2020-02-14 DIAGNOSIS — M62838 Other muscle spasm: Secondary | ICD-10-CM | POA: Diagnosis not present

## 2020-02-14 DIAGNOSIS — M5134 Other intervertebral disc degeneration, thoracic region: Secondary | ICD-10-CM | POA: Diagnosis not present

## 2020-02-14 DIAGNOSIS — M9901 Segmental and somatic dysfunction of cervical region: Secondary | ICD-10-CM | POA: Diagnosis not present

## 2020-02-14 DIAGNOSIS — M5030 Other cervical disc degeneration, unspecified cervical region: Secondary | ICD-10-CM | POA: Diagnosis not present

## 2020-02-14 DIAGNOSIS — R600 Localized edema: Secondary | ICD-10-CM | POA: Diagnosis not present

## 2020-02-14 DIAGNOSIS — S46091A Other injury of muscle(s) and tendon(s) of the rotator cuff of right shoulder, initial encounter: Secondary | ICD-10-CM | POA: Diagnosis not present

## 2020-02-15 DIAGNOSIS — S46091A Other injury of muscle(s) and tendon(s) of the rotator cuff of right shoulder, initial encounter: Secondary | ICD-10-CM | POA: Diagnosis not present

## 2020-02-15 DIAGNOSIS — R600 Localized edema: Secondary | ICD-10-CM | POA: Diagnosis not present

## 2020-02-15 DIAGNOSIS — M5030 Other cervical disc degeneration, unspecified cervical region: Secondary | ICD-10-CM | POA: Diagnosis not present

## 2020-02-15 DIAGNOSIS — M9901 Segmental and somatic dysfunction of cervical region: Secondary | ICD-10-CM | POA: Diagnosis not present

## 2020-02-15 DIAGNOSIS — M5134 Other intervertebral disc degeneration, thoracic region: Secondary | ICD-10-CM | POA: Diagnosis not present

## 2020-02-15 DIAGNOSIS — M62838 Other muscle spasm: Secondary | ICD-10-CM | POA: Diagnosis not present

## 2020-02-15 DIAGNOSIS — M9902 Segmental and somatic dysfunction of thoracic region: Secondary | ICD-10-CM | POA: Diagnosis not present

## 2020-02-20 DIAGNOSIS — Z13220 Encounter for screening for lipoid disorders: Secondary | ICD-10-CM | POA: Diagnosis not present

## 2020-02-20 DIAGNOSIS — K219 Gastro-esophageal reflux disease without esophagitis: Secondary | ICD-10-CM | POA: Diagnosis not present

## 2020-02-20 DIAGNOSIS — I10 Essential (primary) hypertension: Secondary | ICD-10-CM | POA: Diagnosis not present

## 2020-02-20 DIAGNOSIS — R739 Hyperglycemia, unspecified: Secondary | ICD-10-CM | POA: Diagnosis not present

## 2020-02-22 DIAGNOSIS — M9902 Segmental and somatic dysfunction of thoracic region: Secondary | ICD-10-CM | POA: Diagnosis not present

## 2020-02-22 DIAGNOSIS — M5134 Other intervertebral disc degeneration, thoracic region: Secondary | ICD-10-CM | POA: Diagnosis not present

## 2020-02-22 DIAGNOSIS — S46091A Other injury of muscle(s) and tendon(s) of the rotator cuff of right shoulder, initial encounter: Secondary | ICD-10-CM | POA: Diagnosis not present

## 2020-02-22 DIAGNOSIS — M5030 Other cervical disc degeneration, unspecified cervical region: Secondary | ICD-10-CM | POA: Diagnosis not present

## 2020-02-22 DIAGNOSIS — R600 Localized edema: Secondary | ICD-10-CM | POA: Diagnosis not present

## 2020-02-22 DIAGNOSIS — M62838 Other muscle spasm: Secondary | ICD-10-CM | POA: Diagnosis not present

## 2020-02-22 DIAGNOSIS — M9901 Segmental and somatic dysfunction of cervical region: Secondary | ICD-10-CM | POA: Diagnosis not present

## 2020-02-23 DIAGNOSIS — Z0001 Encounter for general adult medical examination with abnormal findings: Secondary | ICD-10-CM | POA: Diagnosis not present

## 2020-02-23 DIAGNOSIS — I10 Essential (primary) hypertension: Secondary | ICD-10-CM | POA: Diagnosis not present

## 2020-02-23 DIAGNOSIS — N481 Balanitis: Secondary | ICD-10-CM | POA: Diagnosis not present

## 2020-02-23 DIAGNOSIS — Z23 Encounter for immunization: Secondary | ICD-10-CM | POA: Diagnosis not present

## 2020-02-23 DIAGNOSIS — E291 Testicular hypofunction: Secondary | ICD-10-CM | POA: Diagnosis not present

## 2020-02-23 DIAGNOSIS — I5032 Chronic diastolic (congestive) heart failure: Secondary | ICD-10-CM | POA: Diagnosis not present

## 2020-02-23 DIAGNOSIS — Z6835 Body mass index (BMI) 35.0-35.9, adult: Secondary | ICD-10-CM | POA: Diagnosis not present

## 2020-02-23 DIAGNOSIS — I2510 Atherosclerotic heart disease of native coronary artery without angina pectoris: Secondary | ICD-10-CM | POA: Diagnosis not present

## 2020-02-27 DIAGNOSIS — M9902 Segmental and somatic dysfunction of thoracic region: Secondary | ICD-10-CM | POA: Diagnosis not present

## 2020-02-27 DIAGNOSIS — S46091A Other injury of muscle(s) and tendon(s) of the rotator cuff of right shoulder, initial encounter: Secondary | ICD-10-CM | POA: Diagnosis not present

## 2020-02-27 DIAGNOSIS — R600 Localized edema: Secondary | ICD-10-CM | POA: Diagnosis not present

## 2020-02-27 DIAGNOSIS — M62838 Other muscle spasm: Secondary | ICD-10-CM | POA: Diagnosis not present

## 2020-02-27 DIAGNOSIS — M9901 Segmental and somatic dysfunction of cervical region: Secondary | ICD-10-CM | POA: Diagnosis not present

## 2020-02-27 DIAGNOSIS — M5030 Other cervical disc degeneration, unspecified cervical region: Secondary | ICD-10-CM | POA: Diagnosis not present

## 2020-02-27 DIAGNOSIS — M5134 Other intervertebral disc degeneration, thoracic region: Secondary | ICD-10-CM | POA: Diagnosis not present

## 2020-02-28 DIAGNOSIS — R600 Localized edema: Secondary | ICD-10-CM | POA: Diagnosis not present

## 2020-02-28 DIAGNOSIS — M62838 Other muscle spasm: Secondary | ICD-10-CM | POA: Diagnosis not present

## 2020-02-28 DIAGNOSIS — M9901 Segmental and somatic dysfunction of cervical region: Secondary | ICD-10-CM | POA: Diagnosis not present

## 2020-02-28 DIAGNOSIS — M5134 Other intervertebral disc degeneration, thoracic region: Secondary | ICD-10-CM | POA: Diagnosis not present

## 2020-02-28 DIAGNOSIS — M5030 Other cervical disc degeneration, unspecified cervical region: Secondary | ICD-10-CM | POA: Diagnosis not present

## 2020-02-28 DIAGNOSIS — M9902 Segmental and somatic dysfunction of thoracic region: Secondary | ICD-10-CM | POA: Diagnosis not present

## 2020-02-28 DIAGNOSIS — S46091A Other injury of muscle(s) and tendon(s) of the rotator cuff of right shoulder, initial encounter: Secondary | ICD-10-CM | POA: Diagnosis not present

## 2020-02-29 DIAGNOSIS — Z79891 Long term (current) use of opiate analgesic: Secondary | ICD-10-CM | POA: Diagnosis not present

## 2020-02-29 DIAGNOSIS — G4489 Other headache syndrome: Secondary | ICD-10-CM | POA: Diagnosis not present

## 2020-02-29 DIAGNOSIS — M5416 Radiculopathy, lumbar region: Secondary | ICD-10-CM | POA: Diagnosis not present

## 2020-02-29 DIAGNOSIS — E1142 Type 2 diabetes mellitus with diabetic polyneuropathy: Secondary | ICD-10-CM | POA: Diagnosis not present

## 2020-02-29 DIAGNOSIS — M79643 Pain in unspecified hand: Secondary | ICD-10-CM | POA: Diagnosis not present

## 2020-02-29 DIAGNOSIS — M542 Cervicalgia: Secondary | ICD-10-CM | POA: Diagnosis not present

## 2020-02-29 DIAGNOSIS — M79606 Pain in leg, unspecified: Secondary | ICD-10-CM | POA: Diagnosis not present

## 2020-02-29 DIAGNOSIS — M545 Low back pain, unspecified: Secondary | ICD-10-CM | POA: Diagnosis not present

## 2020-02-29 DIAGNOSIS — R2689 Other abnormalities of gait and mobility: Secondary | ICD-10-CM | POA: Diagnosis not present

## 2020-03-05 DIAGNOSIS — R600 Localized edema: Secondary | ICD-10-CM | POA: Diagnosis not present

## 2020-03-05 DIAGNOSIS — M9902 Segmental and somatic dysfunction of thoracic region: Secondary | ICD-10-CM | POA: Diagnosis not present

## 2020-03-05 DIAGNOSIS — M62838 Other muscle spasm: Secondary | ICD-10-CM | POA: Diagnosis not present

## 2020-03-05 DIAGNOSIS — S46091A Other injury of muscle(s) and tendon(s) of the rotator cuff of right shoulder, initial encounter: Secondary | ICD-10-CM | POA: Diagnosis not present

## 2020-03-05 DIAGNOSIS — M5134 Other intervertebral disc degeneration, thoracic region: Secondary | ICD-10-CM | POA: Diagnosis not present

## 2020-03-05 DIAGNOSIS — M9901 Segmental and somatic dysfunction of cervical region: Secondary | ICD-10-CM | POA: Diagnosis not present

## 2020-03-05 DIAGNOSIS — M5030 Other cervical disc degeneration, unspecified cervical region: Secondary | ICD-10-CM | POA: Diagnosis not present

## 2020-03-08 DIAGNOSIS — M5134 Other intervertebral disc degeneration, thoracic region: Secondary | ICD-10-CM | POA: Diagnosis not present

## 2020-03-08 DIAGNOSIS — M5030 Other cervical disc degeneration, unspecified cervical region: Secondary | ICD-10-CM | POA: Diagnosis not present

## 2020-03-08 DIAGNOSIS — Z1212 Encounter for screening for malignant neoplasm of rectum: Secondary | ICD-10-CM | POA: Diagnosis not present

## 2020-03-08 DIAGNOSIS — R600 Localized edema: Secondary | ICD-10-CM | POA: Diagnosis not present

## 2020-03-08 DIAGNOSIS — Z1211 Encounter for screening for malignant neoplasm of colon: Secondary | ICD-10-CM | POA: Diagnosis not present

## 2020-03-08 DIAGNOSIS — M62838 Other muscle spasm: Secondary | ICD-10-CM | POA: Diagnosis not present

## 2020-03-08 DIAGNOSIS — M9902 Segmental and somatic dysfunction of thoracic region: Secondary | ICD-10-CM | POA: Diagnosis not present

## 2020-03-08 DIAGNOSIS — S46091A Other injury of muscle(s) and tendon(s) of the rotator cuff of right shoulder, initial encounter: Secondary | ICD-10-CM | POA: Diagnosis not present

## 2020-03-08 DIAGNOSIS — M9901 Segmental and somatic dysfunction of cervical region: Secondary | ICD-10-CM | POA: Diagnosis not present

## 2020-03-10 DIAGNOSIS — M797 Fibromyalgia: Secondary | ICD-10-CM | POA: Diagnosis not present

## 2020-03-10 DIAGNOSIS — I5032 Chronic diastolic (congestive) heart failure: Secondary | ICD-10-CM | POA: Diagnosis not present

## 2020-03-10 DIAGNOSIS — I110 Hypertensive heart disease with heart failure: Secondary | ICD-10-CM | POA: Diagnosis not present

## 2020-03-10 DIAGNOSIS — I2510 Atherosclerotic heart disease of native coronary artery without angina pectoris: Secondary | ICD-10-CM | POA: Diagnosis not present

## 2020-03-13 DIAGNOSIS — M62838 Other muscle spasm: Secondary | ICD-10-CM | POA: Diagnosis not present

## 2020-03-13 DIAGNOSIS — M9902 Segmental and somatic dysfunction of thoracic region: Secondary | ICD-10-CM | POA: Diagnosis not present

## 2020-03-13 DIAGNOSIS — R600 Localized edema: Secondary | ICD-10-CM | POA: Diagnosis not present

## 2020-03-13 DIAGNOSIS — M5030 Other cervical disc degeneration, unspecified cervical region: Secondary | ICD-10-CM | POA: Diagnosis not present

## 2020-03-13 DIAGNOSIS — S46091A Other injury of muscle(s) and tendon(s) of the rotator cuff of right shoulder, initial encounter: Secondary | ICD-10-CM | POA: Diagnosis not present

## 2020-03-13 DIAGNOSIS — M9901 Segmental and somatic dysfunction of cervical region: Secondary | ICD-10-CM | POA: Diagnosis not present

## 2020-03-13 DIAGNOSIS — M5134 Other intervertebral disc degeneration, thoracic region: Secondary | ICD-10-CM | POA: Diagnosis not present

## 2020-03-14 DIAGNOSIS — M5134 Other intervertebral disc degeneration, thoracic region: Secondary | ICD-10-CM | POA: Diagnosis not present

## 2020-03-14 DIAGNOSIS — M9902 Segmental and somatic dysfunction of thoracic region: Secondary | ICD-10-CM | POA: Diagnosis not present

## 2020-03-14 DIAGNOSIS — R600 Localized edema: Secondary | ICD-10-CM | POA: Diagnosis not present

## 2020-03-14 DIAGNOSIS — M9901 Segmental and somatic dysfunction of cervical region: Secondary | ICD-10-CM | POA: Diagnosis not present

## 2020-03-14 DIAGNOSIS — M62838 Other muscle spasm: Secondary | ICD-10-CM | POA: Diagnosis not present

## 2020-03-14 DIAGNOSIS — M5030 Other cervical disc degeneration, unspecified cervical region: Secondary | ICD-10-CM | POA: Diagnosis not present

## 2020-03-14 DIAGNOSIS — S46091A Other injury of muscle(s) and tendon(s) of the rotator cuff of right shoulder, initial encounter: Secondary | ICD-10-CM | POA: Diagnosis not present

## 2020-03-19 DIAGNOSIS — M9902 Segmental and somatic dysfunction of thoracic region: Secondary | ICD-10-CM | POA: Diagnosis not present

## 2020-03-19 DIAGNOSIS — M9901 Segmental and somatic dysfunction of cervical region: Secondary | ICD-10-CM | POA: Diagnosis not present

## 2020-03-19 DIAGNOSIS — R600 Localized edema: Secondary | ICD-10-CM | POA: Diagnosis not present

## 2020-03-19 DIAGNOSIS — S46091A Other injury of muscle(s) and tendon(s) of the rotator cuff of right shoulder, initial encounter: Secondary | ICD-10-CM | POA: Diagnosis not present

## 2020-03-19 DIAGNOSIS — M5134 Other intervertebral disc degeneration, thoracic region: Secondary | ICD-10-CM | POA: Diagnosis not present

## 2020-03-19 DIAGNOSIS — M5030 Other cervical disc degeneration, unspecified cervical region: Secondary | ICD-10-CM | POA: Diagnosis not present

## 2020-03-19 DIAGNOSIS — M62838 Other muscle spasm: Secondary | ICD-10-CM | POA: Diagnosis not present

## 2020-03-20 DIAGNOSIS — M62838 Other muscle spasm: Secondary | ICD-10-CM | POA: Diagnosis not present

## 2020-03-20 DIAGNOSIS — M5030 Other cervical disc degeneration, unspecified cervical region: Secondary | ICD-10-CM | POA: Diagnosis not present

## 2020-03-20 DIAGNOSIS — R600 Localized edema: Secondary | ICD-10-CM | POA: Diagnosis not present

## 2020-03-20 DIAGNOSIS — M9902 Segmental and somatic dysfunction of thoracic region: Secondary | ICD-10-CM | POA: Diagnosis not present

## 2020-03-20 DIAGNOSIS — M5134 Other intervertebral disc degeneration, thoracic region: Secondary | ICD-10-CM | POA: Diagnosis not present

## 2020-03-20 DIAGNOSIS — S46091A Other injury of muscle(s) and tendon(s) of the rotator cuff of right shoulder, initial encounter: Secondary | ICD-10-CM | POA: Diagnosis not present

## 2020-03-20 DIAGNOSIS — M9901 Segmental and somatic dysfunction of cervical region: Secondary | ICD-10-CM | POA: Diagnosis not present

## 2020-03-23 LAB — COLOGUARD: COLOGUARD: POSITIVE — AB

## 2020-03-27 DIAGNOSIS — M62838 Other muscle spasm: Secondary | ICD-10-CM | POA: Diagnosis not present

## 2020-03-27 DIAGNOSIS — S46091A Other injury of muscle(s) and tendon(s) of the rotator cuff of right shoulder, initial encounter: Secondary | ICD-10-CM | POA: Diagnosis not present

## 2020-03-27 DIAGNOSIS — M9901 Segmental and somatic dysfunction of cervical region: Secondary | ICD-10-CM | POA: Diagnosis not present

## 2020-03-27 DIAGNOSIS — M9902 Segmental and somatic dysfunction of thoracic region: Secondary | ICD-10-CM | POA: Diagnosis not present

## 2020-03-27 DIAGNOSIS — M5134 Other intervertebral disc degeneration, thoracic region: Secondary | ICD-10-CM | POA: Diagnosis not present

## 2020-03-27 DIAGNOSIS — M5030 Other cervical disc degeneration, unspecified cervical region: Secondary | ICD-10-CM | POA: Diagnosis not present

## 2020-03-27 DIAGNOSIS — R600 Localized edema: Secondary | ICD-10-CM | POA: Diagnosis not present

## 2020-04-02 DIAGNOSIS — M9901 Segmental and somatic dysfunction of cervical region: Secondary | ICD-10-CM | POA: Diagnosis not present

## 2020-04-02 DIAGNOSIS — R600 Localized edema: Secondary | ICD-10-CM | POA: Diagnosis not present

## 2020-04-02 DIAGNOSIS — M5030 Other cervical disc degeneration, unspecified cervical region: Secondary | ICD-10-CM | POA: Diagnosis not present

## 2020-04-02 DIAGNOSIS — M62838 Other muscle spasm: Secondary | ICD-10-CM | POA: Diagnosis not present

## 2020-04-02 DIAGNOSIS — M5134 Other intervertebral disc degeneration, thoracic region: Secondary | ICD-10-CM | POA: Diagnosis not present

## 2020-04-02 DIAGNOSIS — M9902 Segmental and somatic dysfunction of thoracic region: Secondary | ICD-10-CM | POA: Diagnosis not present

## 2020-04-02 DIAGNOSIS — S46091A Other injury of muscle(s) and tendon(s) of the rotator cuff of right shoulder, initial encounter: Secondary | ICD-10-CM | POA: Diagnosis not present

## 2020-04-03 DIAGNOSIS — M9902 Segmental and somatic dysfunction of thoracic region: Secondary | ICD-10-CM | POA: Diagnosis not present

## 2020-04-03 DIAGNOSIS — M9901 Segmental and somatic dysfunction of cervical region: Secondary | ICD-10-CM | POA: Diagnosis not present

## 2020-04-03 DIAGNOSIS — M5134 Other intervertebral disc degeneration, thoracic region: Secondary | ICD-10-CM | POA: Diagnosis not present

## 2020-04-03 DIAGNOSIS — M5030 Other cervical disc degeneration, unspecified cervical region: Secondary | ICD-10-CM | POA: Diagnosis not present

## 2020-04-03 DIAGNOSIS — S46091A Other injury of muscle(s) and tendon(s) of the rotator cuff of right shoulder, initial encounter: Secondary | ICD-10-CM | POA: Diagnosis not present

## 2020-04-03 DIAGNOSIS — M62838 Other muscle spasm: Secondary | ICD-10-CM | POA: Diagnosis not present

## 2020-04-03 DIAGNOSIS — R600 Localized edema: Secondary | ICD-10-CM | POA: Diagnosis not present

## 2020-04-10 DIAGNOSIS — M9901 Segmental and somatic dysfunction of cervical region: Secondary | ICD-10-CM | POA: Diagnosis not present

## 2020-04-10 DIAGNOSIS — M5030 Other cervical disc degeneration, unspecified cervical region: Secondary | ICD-10-CM | POA: Diagnosis not present

## 2020-04-10 DIAGNOSIS — R600 Localized edema: Secondary | ICD-10-CM | POA: Diagnosis not present

## 2020-04-10 DIAGNOSIS — M62838 Other muscle spasm: Secondary | ICD-10-CM | POA: Diagnosis not present

## 2020-04-10 DIAGNOSIS — S46091A Other injury of muscle(s) and tendon(s) of the rotator cuff of right shoulder, initial encounter: Secondary | ICD-10-CM | POA: Diagnosis not present

## 2020-04-10 DIAGNOSIS — M5134 Other intervertebral disc degeneration, thoracic region: Secondary | ICD-10-CM | POA: Diagnosis not present

## 2020-04-10 DIAGNOSIS — M9902 Segmental and somatic dysfunction of thoracic region: Secondary | ICD-10-CM | POA: Diagnosis not present

## 2020-04-20 DIAGNOSIS — B351 Tinea unguium: Secondary | ICD-10-CM | POA: Diagnosis not present

## 2020-04-20 DIAGNOSIS — I70203 Unspecified atherosclerosis of native arteries of extremities, bilateral legs: Secondary | ICD-10-CM | POA: Diagnosis not present

## 2020-04-20 DIAGNOSIS — M79676 Pain in unspecified toe(s): Secondary | ICD-10-CM | POA: Diagnosis not present

## 2020-04-20 DIAGNOSIS — L84 Corns and callosities: Secondary | ICD-10-CM | POA: Diagnosis not present

## 2020-04-25 DIAGNOSIS — R195 Other fecal abnormalities: Secondary | ICD-10-CM | POA: Diagnosis not present

## 2020-05-01 ENCOUNTER — Other Ambulatory Visit: Payer: Self-pay | Admitting: Family Medicine

## 2020-05-11 DIAGNOSIS — I110 Hypertensive heart disease with heart failure: Secondary | ICD-10-CM | POA: Diagnosis not present

## 2020-05-11 DIAGNOSIS — I5032 Chronic diastolic (congestive) heart failure: Secondary | ICD-10-CM | POA: Diagnosis not present

## 2020-05-11 DIAGNOSIS — M797 Fibromyalgia: Secondary | ICD-10-CM | POA: Diagnosis not present

## 2020-05-11 DIAGNOSIS — I2510 Atherosclerotic heart disease of native coronary artery without angina pectoris: Secondary | ICD-10-CM | POA: Diagnosis not present

## 2020-06-25 ENCOUNTER — Telehealth: Payer: Self-pay | Admitting: Student

## 2020-06-25 MED ORDER — ISOSORBIDE MONONITRATE ER 120 MG PO TB24: 120.0000 mg | ORAL_TABLET | Freq: Every day | ORAL | 3 refills | Status: DC

## 2020-06-25 MED ORDER — NITROGLYCERIN 0.4 MG SL SUBL: SUBLINGUAL_TABLET | SUBLINGUAL | 3 refills | Status: DC

## 2020-06-25 MED ORDER — ATORVASTATIN CALCIUM 40 MG PO TABS: ORAL_TABLET | ORAL | 3 refills | Status: DC

## 2020-06-25 NOTE — Telephone Encounter (Signed)
Medication refill request approved.

## 2020-06-25 NOTE — Telephone Encounter (Signed)
Patient is needing 90 day supply of his medications sent to his pharmacy   atorvastatin (LIPITOR) 40 MG tablet [[id number]]   isosorbide mononitrate (IMDUR) 120 MG 24 hr tablet [[id number]] ENDED  And Nitro - don't see it on the list   He is now using the [employer] order

## 2020-07-30 NOTE — Progress Notes (Signed)
Cardiology Office Note    Date:  07/31/2020   ID:  Isaic, Syler 05-Jun-1950, MRN [id number]  PCP:  Rory Percy, MD  Cardiologist: Previously Kate Sable, MD (Inactive)  --> Needs to switch to new MD  Chief Complaint  Patient presents with  . Follow-up    6 month visit    History of Present Illness:    ZENO HICKEL is a 70 y.o. male with past medical history of CAD (s/p DES to LAD and PTCA of D1 in 2010, cath in 2016 showing patent stent with 50% stenosis of jailed D1, cath in 10/2017 showing patent LAD stent with 70% D1 stenosis and 80% RV branch stenosis and medical management recommended), HTN, HLD, OSA (on CPAP) and chronic lower extremity edema who presents for 34-month follow-up.   He was last examined by myself in 01/2020 and reported occasional episodes of angina but symptoms would typically resolve with SL NTG x1. Reported being under increased stress and he felt this was contributing. We discussed titration of Imdur but he wished to continue his current therapy at that time.   In talking with the patient today, he reports still having occasional episodes of angina which typically occur when he feels stressed. Reports episodes occur mostly at night and have not been associated with exertion. No associated radiating pain, nausea, vomiting or diaphoresis but do improve with SL NTG. He reports his wife's hip surgery was delayed due to insurance complications, therefore he has been helping to take care of her and also do all the household chores. He denies any exertional chest pain with these activities. No recent palpitations, orthopnea or PND. Uses his CPAP on a nightly basis. He remains on Lasix 120 mg daily as he reports having worsening edema on lower dose in the past.   Past Medical History:  Diagnosis Date  . Anxiety    HX PANIC ATTACKS  . CHF (congestive heart failure) ([place])   . Chronic cough   . Coronary artery disease    a. s/p DES to LAD and PTCA of  D1 in 2010 b. cath in 2016 showing patent stent with 50% stenosis of jailed D1 c. cath in 10/2017 showing patent LAD stent with 70% D1 stenosis and 80% RV branch stenosis and medical management recommended  . Dry eyes   . Fibromyalgia   . Fluid retention   . GERD (gastroesophageal reflux disease)   . Headache(784.0)   . Herpes genitalis in men   . History of kidney stones   . History of vertigo   . Hypertension   . Narcolepsy   . Neuropathy   . Penile lesion   . Sjogren's disease ([place])   . Sleep apnea    USES C-PAP    Past Surgical History:  Procedure Laterality Date  . BREAST SURGERY     BIL MASTECTOMY  FOR BENIGN LUMPS  . CARDIAC CATHETERIZATION N/A 01/09/2015   Procedure: Left Heart Cath and Coronary Angiography;  Surgeon: Leonie Man, MD;  Location: [facility];  Service: Cardiovascular;  Laterality: N/A;  . CARPAL TUNNELL  BIL  . CIRCUMCISION    . CORONARY ANGIOPLASTY WITH STENT PLACEMENT  X1 2010  . HERNIA REPAIR     ING HERNIA REPAIR  . LEFT HEART CATH AND CORONARY ANGIOGRAPHY N/A 10/15/2017   Procedure: LEFT HEART CATH AND CORONARY ANGIOGRAPHY;  Surgeon: Martinique, Peter M, MD;  Location: [facility];  Service: Cardiovascular;  Laterality: N/A;  .  LEFT HEART CATHETERIZATION WITH CORONARY ANGIOGRAM N/A 10/11/2013   Procedure: LEFT HEART CATHETERIZATION WITH CORONARY ANGIOGRAM;  Surgeon: Sinclair Grooms, MD;  Location: [facility] CATH LAB;  Service: Cardiovascular;  Laterality: N/A;  . LEFT HEART CATHETERIZATION WITH CORONARY ANGIOGRAM N/A 06/21/2014   Procedure: LEFT HEART CATHETERIZATION WITH CORONARY ANGIOGRAM;  Surgeon: Troy Sine, MD;  Location: [facility] CATH LAB;  Service: Cardiovascular;  Laterality: N/A;  . MEATOTOMY  X2  . PERCUTANEOUS CORONARY STENT INTERVENTION (PCI-S) N/A 06/23/2014   Procedure: PERCUTANEOUS CORONARY STENT INTERVENTION (PCI-S);  Surgeon: Jettie Booze, MD;  Location: [facility] CATH LAB;  Service: Cardiovascular;  Laterality: N/A;  . VASECTOMY     . WRIST FRACTURE SURGERY  L  X3 SURGERIES    Current Medications: Outpatient Medications Prior to Visit  Medication Sig Dispense Refill  . aspirin EC 81 MG tablet Take 81 mg by mouth at bedtime.     Marland Kitchen atorvastatin (LIPITOR) 40 MG tablet TAKE 1 TABLET IN THE EVENING 90 tablet 3  . b complex vitamins tablet Take 1 tablet by mouth daily.    . betamethasone valerate ointment (VALISONE) 0.1 % Apply 1 application topically 2 (two) times daily as needed (itching).    . Butalbital-APAP-Caffeine 50-325-40 MG capsule Take 1 capsule by mouth 3 (three) times daily as needed for headache.     . Cholecalciferol (VITAMIN D) 2000 units tablet Take 2,000 Units by mouth daily.    . citalopram (CELEXA) 20 MG tablet Take 20 mg by mouth daily.     . clonazePAM (KLONOPIN) 0.5 MG tablet Take 0.5 mg by mouth 2 (two) times daily as needed for anxiety.     . diphenhydrAMINE (BENADRYL) 25 MG tablet Take 25-50 mg by mouth 2 (two) times daily as needed for itching.     . fluticasone (CUTIVATE) 0.05 % cream Apply 1 application topically 2 (two) times daily as needed (rash). Applied to face and/or penis area(s)    . furosemide (LASIX) 40 MG tablet Take 120 mg by mouth daily.    Marland Kitchen gabapentin (NEURONTIN) 600 MG tablet Take 1,800 mg by mouth 2 (two) times daily.    Marland Kitchen guaifenesin (HUMIBID E) 400 MG TABS tablet Take 800 mg by mouth daily as needed (for post nasal drip/cough).     Marland Kitchen HYDROmorphone (DILAUDID) 4 MG tablet Take 4 mg by mouth 4 (four) times daily as needed for severe pain.    Marland Kitchen ibuprofen (ADVIL,MOTRIN) 200 MG tablet Take 600 mg by mouth 4 (four) times daily as needed for mild pain or moderate pain.     . isosorbide mononitrate (IMDUR) 120 MG 24 hr tablet Take 1 tablet (120 mg total) by mouth daily. 90 tablet 3  . L-Lysine 500 MG TABS Take 500 mg by mouth daily.    Marland Kitchen loperamide (IMODIUM A-D) 2 MG tablet Take 6 mg by mouth 4 (four) times daily as needed for diarrhea or loose stools.    . Multiple Vitamins-Minerals  (MULTIVITAMIN WITH MINERALS) tablet Take 1 tablet by mouth every evening.     . nitroGLYCERIN (NITROSTAT) 0.4 MG SL tablet Take one every 5 minutes as needed for chest pain 25 tablet 3  . NON FORMULARY at bedtime. CPAP    . omeprazole (PRILOSEC) 40 MG capsule Take 40 mg by mouth daily before breakfast.    . polyethylene glycol (MIRALAX / GLYCOLAX) packet Take 17 g by mouth daily as needed for moderate constipation.    . polyvinyl alcohol-povidone (HYPOTEARS) 1.4-0.6 % ophthalmic solution  Place 1-2 drops into both eyes daily as needed (dry eyes). For dry eyes    . potassium chloride (K-DUR) 10 MEQ tablet Take 10-20 mEq by mouth See admin instructions. 2 tablets in AM and 1 tablet in PM    . testosterone cypionate (DEPOTESTOSTERONE CYPIONATE) 200 MG/ML injection Inject 100 mg into the muscle every 14 (fourteen) days.     Marland Kitchen topiramate (TOPAMAX) 25 MG tablet Take 75 mg by mouth at bedtime.    . valACYclovir (VALTREX) 500 MG tablet Take 500 mg by mouth 2 (two) times daily as needed (fever blisters).     . vitamin C (ASCORBIC ACID) 500 MG tablet Take 500 mg by mouth every morning.    . metoprolol succinate (TOPROL-XL) 50 MG 24 hr tablet Take 1 tablet (50 mg total) by mouth daily. Take with or immediately following a meal. 90 tablet 3   No facility-administered medications prior to visit.     Allergies:   Bee venom, Duloxetine, Fentanyl, Lidocaine, Meperidine hcl, Methadone, Oxycodone hcl, Pregabalin, and Zolpidem tartrate   Social History   Socioeconomic History  . Marital status: Married    Spouse name: Not on file  . Number of children: Not on file  . Years of education: Not on file  . Highest education level: Not on file  Occupational History  . Not on file  Tobacco Use  . Smoking status: Former Smoker    Packs/day: 2.50    Years: 32.00    Pack years: 80.00    Types: Cigarettes    Start date: 04/17/1967    Quit date: 05/12/2000    Years since quitting: 20.2  . Smokeless tobacco: Never  Used  Vaping Use  . Vaping Use: Never used  Substance and Sexual Activity  . Alcohol use: No    Alcohol/week: 0.0 standard drinks    Comment: RECOVERING ALCOHOLIC 9 YRS- 4196  . Drug use: No  . Sexual activity: Not on file  Other Topics Concern  . Not on file  Social History Narrative  . Not on file   Social Determinants of Health   Financial Resource Strain: Not on file  Food Insecurity: Not on file  Transportation Needs: Not on file  Physical Activity: Not on file  Stress: Not on file  Social Connections: Not on file     Family History:  The patient's family history includes Heart disease in an other family member.   Review of Systems:   Please see the history of present illness.     General:  No chills, fever, night sweats or weight changes.  Cardiovascular:  No dyspnea on exertion, edema, orthopnea, palpitations, paroxysmal nocturnal dyspnea. Positive for chest pain.  Dermatological: No rash, lesions/masses Respiratory: No cough, dyspnea Urologic: No hematuria, dysuria Abdominal:   No nausea, vomiting, diarrhea, bright red blood per rectum, melena, or hematemesis Neurologic:  No visual changes, wkns, changes in mental status. All other systems reviewed and are otherwise negative except as noted above.   Physical Exam:    VS:  BP 130/83   Pulse 60   Resp 20   Ht 5\' 8"  (1.727 m)   Wt 236 lb (107 kg)   SpO2 95%   BMI 35.88 kg/m    General: Well developed, well nourished,male appearing in no acute distress. Head: Normocephalic, atraumatic. Neck: No carotid bruits. JVD not elevated.  Lungs: Respirations regular and unlabored, without wheezes or rales.  Heart: Regular rate and rhythm. No S3 or S4.  No murmur, no  rubs, or gallops appreciated. Abdomen: Appears non-distended. No obvious abdominal masses. Msk:  Strength and tone appear normal for age. No obvious joint deformities or effusions. Extremities: No clubbing or cyanosis. Trace ankle edema bilaterally.   Distal pedal pulses are 2+ bilaterally. Neuro: Alert and oriented X 3. Moves all extremities spontaneously. No focal deficits noted. Psych:  Responds to questions appropriately with a normal affect. Skin: No rashes or lesions noted  Wt Readings from Last 3 Encounters:  07/31/20 236 lb (107 kg)  02/01/20 243 lb 12.8 oz (110.6 kg)  07/13/19 236 lb (107 kg)     Studies/Labs Reviewed:   EKG:  EKG is not ordered today.    Recent Labs: No results found for requested labs within last 8760 hours.   Lipid Panel    Component Value Date/Time   CHOL 168 10/11/2013 0357   TRIG 172 (H) 10/11/2013 0357   HDL 34 (L) 10/11/2013 0357   CHOLHDL 4.9 10/11/2013 0357   VLDL 34 10/11/2013 0357   LDLCALC 100 (H) 10/11/2013 0357    Additional studies/ records that were reviewed today include:   NST: 08/2016  There was no ST segment deviation noted during stress.  The study is normal. Inferior defect is likely subdiaphragmatic attenuation.  This is a low risk study.  The left ventricular ejection fraction is normal (55-65%).   Cardiac Catheterization: 10/2017  Ost 1st Diag to 1st Diag lesion is 70% stenosed.  Ost 2nd Diag lesion is 50% stenosed.  Acute Mrg lesion is 80% stenosed.  Previously placed Prox LAD to Mid LAD stent (unknown type) is widely patent.  The left ventricular systolic function is normal.  LV end diastolic pressure is normal.  The left ventricular ejection fraction is 55-65% by visual estimate.   1. Small vessel obstructive CAD with 70% ostial first diagonal ( jailed by stent) and 80% ostial RV marginal branch 2. Continued patency of stents in the LAD 3. Normal LV function 4. Normal LVEDP  Plan: really no change from last cardiac cath in August 2016. Recommend continued medical therapy.   Assessment:    1. Coronary artery disease of native artery of native heart with stable angina pectoris ([place])   2. Essential hypertension   3. Hyperlipidemia LDL goal  <70   4. Bilateral lower extremity edema      Plan:   In order of problems listed above:  1. CAD with Stable Angina - He is s/p DES to LAD and PTCA of D1 in 2010 with cath in 2016 showing patent stent with 50% stenosis of jailed D1 and most recent cath in 10/2017 showing patent LAD stent with 70% D1 stenosis and 80% RV branch stenosis and medical management was recommended. - He does report intermittent episodes of chest discomfort but feels like these are mostly stress related given his current circumstances. He denies any specific exertional chest pain. Discussed options with the patient in regards to repeat testing vs. medication changes and he wishes to try medication changes initially. He is on Imdur 120 mg daily and tolerating this well. I recommended that he continue on this but add 30 mg in the PM given his symptoms in the evening hours/overnight. He does have SL NTG at home and is aware to proceed to the ED if he ever has to take more than 3 during one episode. Also encouraged him to make [place] aware if symptoms persist or he experiences exertional pain, as stress testing can be arranged. - Continue ASA 81 mg  daily, Atorvastatin 40 mg daily and Toprol-XL 50 mg daily.  2. HTN - BP is well controlled at 130/83 during today's visit. Will continue Toprol-XL 50 mg daily and plan to titrate Imdur as outlined above.  3.  HLD - He did bring with him today a copy of recent labs from his PCP which showed total cholesterol 136, triglycerides 246, HDL 38 and LDL 59. Continue Atorvastatin 40 mg daily.  4. Lower Extremity Edema - He has been on Lasix 120 mg daily per his PCP and reports worsening edema when dosing was previously reduced in the past. Will continue current regimen for now as creatinine was stable at 0.94 by recent labs last month and K+ was stable at 4.6.    Medication Adjustments/Labs and Tests Ordered: Current medicines are reviewed at length with the patient today.  Concerns  regarding medicines are outlined above.  Medication changes, Labs and Tests ordered today are listed in the Patient Instructions below. Patient Instructions  Medication Instructions:  Increase Imdur to 120 mg am and 30 mg at bedtime.   *If you need a refill on your cardiac medications before your next appointment, please call your pharmacy*   Lab Work: None today If you have labs (blood work) drawn today and your tests are completely normal, you will receive your results only by: Marland Kitchen MyChart Message (if you have MyChart) OR . A paper copy in the mail If you have any lab test that is abnormal or we need to change your treatment, we will call you to review the results.   Testing/Procedures: None today    Follow-Up: At [facility], you and your health needs are our priority.  As part of our continuing mission to provide you with exceptional heart care, we have created designated Provider Care Teams.  These Care Teams include your primary Cardiologist (physician) and Advanced Practice Providers (APPs -  Physician Assistants and Nurse Practitioners) who all work together to provide you with the care you need, when you need it.  We recommend signing up for the patient portal called "MyChart".  Sign up information is provided on this After Visit Summary.  MyChart is used to connect with patients for Virtual Visits (Telemedicine).  Patients are able to view lab/test results, encounter notes, upcoming appointments, etc.  Non-urgent messages can be sent to your provider as well.   To learn more about what you can do with MyChart, go to [email or web address].    Your next appointment:   6 month(s)  The format for your next appointment:   In Person  Provider:   You may see a cardiologist  or one of the following Advanced Practice Providers on your designated Care Team:    Bernerd Pho, PA-C   Ermalinda Barrios, PA-C     Other Instructions None      Thank you for choosing [facility] !            Signed, Erma Heritage, PA-C  07/31/2020 5:07 PM    [facility]. [address] [place], [place] [address] Phone: [phone] Fax: [phone]

## 2020-07-31 ENCOUNTER — Ambulatory Visit: Payer: Medicare PPO | Admitting: Student

## 2020-07-31 ENCOUNTER — Other Ambulatory Visit: Payer: Self-pay

## 2020-07-31 ENCOUNTER — Encounter: Payer: Self-pay | Admitting: Student

## 2020-07-31 VITALS — BP 130/83 | HR 60 | Resp 20 | Ht 68.0 in | Wt 236.0 lb

## 2020-07-31 DIAGNOSIS — E785 Hyperlipidemia, unspecified: Secondary | ICD-10-CM | POA: Diagnosis not present

## 2020-07-31 DIAGNOSIS — I25118 Atherosclerotic heart disease of native coronary artery with other forms of angina pectoris: Secondary | ICD-10-CM

## 2020-07-31 DIAGNOSIS — I10 Essential (primary) hypertension: Secondary | ICD-10-CM | POA: Diagnosis not present

## 2020-07-31 DIAGNOSIS — R600 Localized edema: Secondary | ICD-10-CM | POA: Diagnosis not present

## 2020-07-31 MED ORDER — ISOSORBIDE MONONITRATE ER 30 MG PO TB24: 30.0000 mg | ORAL_TABLET | Freq: Every day | ORAL | 3 refills | Status: DC

## 2020-07-31 NOTE — Patient Instructions (Signed)
Medication Instructions:  Increase Imdur to 120 mg am and 30 mg at bedtime.   *If you need a refill on your cardiac medications before your next appointment, please call your pharmacy*   Lab Work: None today If you have labs (blood work) drawn today and your tests are completely normal, you will receive your results only by: Marland Kitchen MyChart Message (if you have MyChart) OR . A paper copy in the mail If you have any lab test that is abnormal or we need to change your treatment, we will call you to review the results.   Testing/Procedures: None today    Follow-Up: At [facility], you and your health needs are our priority.  As part of our continuing mission to provide you with exceptional heart care, we have created designated Provider Care Teams.  These Care Teams include your primary Cardiologist (physician) and Advanced Practice Providers (APPs -  Physician Assistants and Nurse Practitioners) who all work together to provide you with the care you need, when you need it.  We recommend signing up for the patient portal called "MyChart".  Sign up information is provided on this After Visit Summary.  MyChart is used to connect with patients for Virtual Visits (Telemedicine).  Patients are able to view lab/test results, encounter notes, upcoming appointments, etc.  Non-urgent messages can be sent to your provider as well.   To learn more about what you can do with MyChart, go to [email or web address].    Your next appointment:   6 month(s)  The format for your next appointment:   In Person  Provider:   You may see a cardiologist  or one of the following Advanced Practice Providers on your designated Care Team:    Bernerd Pho, PA-C   Ermalinda Barrios, PA-C     Other Instructions None      Thank you for choosing [place] !

## 2020-08-08 DIAGNOSIS — G4733 Obstructive sleep apnea (adult) (pediatric): Secondary | ICD-10-CM | POA: Diagnosis not present

## 2020-08-08 DIAGNOSIS — I2510 Atherosclerotic heart disease of native coronary artery without angina pectoris: Secondary | ICD-10-CM | POA: Diagnosis not present

## 2020-08-08 DIAGNOSIS — M797 Fibromyalgia: Secondary | ICD-10-CM | POA: Diagnosis not present

## 2020-08-08 DIAGNOSIS — I5032 Chronic diastolic (congestive) heart failure: Secondary | ICD-10-CM | POA: Diagnosis not present

## 2020-08-08 DIAGNOSIS — I110 Hypertensive heart disease with heart failure: Secondary | ICD-10-CM | POA: Diagnosis not present

## 2020-08-16 DIAGNOSIS — M79606 Pain in leg, unspecified: Secondary | ICD-10-CM | POA: Diagnosis not present

## 2020-08-16 DIAGNOSIS — Z79891 Long term (current) use of opiate analgesic: Secondary | ICD-10-CM | POA: Diagnosis not present

## 2020-08-16 DIAGNOSIS — G4489 Other headache syndrome: Secondary | ICD-10-CM | POA: Diagnosis not present

## 2020-08-16 DIAGNOSIS — R2689 Other abnormalities of gait and mobility: Secondary | ICD-10-CM | POA: Diagnosis not present

## 2020-08-16 DIAGNOSIS — M542 Cervicalgia: Secondary | ICD-10-CM | POA: Diagnosis not present

## 2020-08-16 DIAGNOSIS — E1142 Type 2 diabetes mellitus with diabetic polyneuropathy: Secondary | ICD-10-CM | POA: Diagnosis not present

## 2020-08-16 DIAGNOSIS — M79643 Pain in unspecified hand: Secondary | ICD-10-CM | POA: Diagnosis not present

## 2020-08-16 DIAGNOSIS — M5416 Radiculopathy, lumbar region: Secondary | ICD-10-CM | POA: Diagnosis not present

## 2020-08-16 DIAGNOSIS — M545 Low back pain, unspecified: Secondary | ICD-10-CM | POA: Diagnosis not present

## 2020-09-08 DIAGNOSIS — I5032 Chronic diastolic (congestive) heart failure: Secondary | ICD-10-CM | POA: Diagnosis not present

## 2020-09-08 DIAGNOSIS — M797 Fibromyalgia: Secondary | ICD-10-CM | POA: Diagnosis not present

## 2020-09-08 DIAGNOSIS — I2510 Atherosclerotic heart disease of native coronary artery without angina pectoris: Secondary | ICD-10-CM | POA: Diagnosis not present

## 2020-09-08 DIAGNOSIS — I10 Essential (primary) hypertension: Secondary | ICD-10-CM | POA: Diagnosis not present

## 2020-09-12 DIAGNOSIS — I10 Essential (primary) hypertension: Secondary | ICD-10-CM | POA: Diagnosis not present

## 2020-09-12 DIAGNOSIS — E291 Testicular hypofunction: Secondary | ICD-10-CM | POA: Diagnosis not present

## 2020-09-12 DIAGNOSIS — Z0001 Encounter for general adult medical examination with abnormal findings: Secondary | ICD-10-CM | POA: Diagnosis not present

## 2020-09-12 DIAGNOSIS — Z13220 Encounter for screening for lipoid disorders: Secondary | ICD-10-CM | POA: Diagnosis not present

## 2020-09-12 DIAGNOSIS — R739 Hyperglycemia, unspecified: Secondary | ICD-10-CM | POA: Diagnosis not present

## 2020-09-20 DIAGNOSIS — K219 Gastro-esophageal reflux disease without esophagitis: Secondary | ICD-10-CM | POA: Diagnosis not present

## 2020-09-20 DIAGNOSIS — J449 Chronic obstructive pulmonary disease, unspecified: Secondary | ICD-10-CM | POA: Diagnosis not present

## 2020-09-20 DIAGNOSIS — I5032 Chronic diastolic (congestive) heart failure: Secondary | ICD-10-CM | POA: Diagnosis not present

## 2020-09-20 DIAGNOSIS — R059 Cough, unspecified: Secondary | ICD-10-CM | POA: Diagnosis not present

## 2020-09-20 DIAGNOSIS — I2510 Atherosclerotic heart disease of native coronary artery without angina pectoris: Secondary | ICD-10-CM | POA: Diagnosis not present

## 2020-09-20 DIAGNOSIS — N481 Balanitis: Secondary | ICD-10-CM | POA: Diagnosis not present

## 2020-09-20 DIAGNOSIS — E291 Testicular hypofunction: Secondary | ICD-10-CM | POA: Diagnosis not present

## 2020-09-20 DIAGNOSIS — I10 Essential (primary) hypertension: Secondary | ICD-10-CM | POA: Diagnosis not present

## 2020-11-01 DIAGNOSIS — G4733 Obstructive sleep apnea (adult) (pediatric): Secondary | ICD-10-CM | POA: Diagnosis not present

## 2020-11-30 ENCOUNTER — Telehealth: Payer: Self-pay | Admitting: Student

## 2020-11-30 MED ORDER — ATORVASTATIN CALCIUM 40 MG PO TABS: ORAL_TABLET | ORAL | 3 refills | Status: DC

## 2020-11-30 NOTE — Telephone Encounter (Signed)
Refill complete 

## 2020-11-30 NOTE — Telephone Encounter (Signed)
New message      *STAT* If patient is at the pharmacy, call can be transferred to refill team.   1. Which medications need to be refilled? (please list name of each medication and dose if known) atorvastatin  2. Which pharmacy/location (including street and city if local pharmacy) is medication to be sent to? [facility] [place] (use to be [employer])  3. Do they need a 30 day or 90 day supply? [place]

## 2020-12-04 DIAGNOSIS — R2689 Other abnormalities of gait and mobility: Secondary | ICD-10-CM | POA: Diagnosis not present

## 2020-12-04 DIAGNOSIS — M79606 Pain in leg, unspecified: Secondary | ICD-10-CM | POA: Diagnosis not present

## 2020-12-04 DIAGNOSIS — Z79891 Long term (current) use of opiate analgesic: Secondary | ICD-10-CM | POA: Diagnosis not present

## 2020-12-04 DIAGNOSIS — M542 Cervicalgia: Secondary | ICD-10-CM | POA: Diagnosis not present

## 2020-12-04 DIAGNOSIS — M545 Low back pain, unspecified: Secondary | ICD-10-CM | POA: Diagnosis not present

## 2020-12-04 DIAGNOSIS — M5416 Radiculopathy, lumbar region: Secondary | ICD-10-CM | POA: Diagnosis not present

## 2020-12-04 DIAGNOSIS — M79643 Pain in unspecified hand: Secondary | ICD-10-CM | POA: Diagnosis not present

## 2020-12-04 DIAGNOSIS — G4489 Other headache syndrome: Secondary | ICD-10-CM | POA: Diagnosis not present

## 2020-12-04 DIAGNOSIS — E1142 Type 2 diabetes mellitus with diabetic polyneuropathy: Secondary | ICD-10-CM | POA: Diagnosis not present

## 2020-12-09 DIAGNOSIS — M797 Fibromyalgia: Secondary | ICD-10-CM | POA: Diagnosis not present

## 2020-12-09 DIAGNOSIS — I110 Hypertensive heart disease with heart failure: Secondary | ICD-10-CM | POA: Diagnosis not present

## 2020-12-09 DIAGNOSIS — I5032 Chronic diastolic (congestive) heart failure: Secondary | ICD-10-CM | POA: Diagnosis not present

## 2020-12-09 DIAGNOSIS — I2510 Atherosclerotic heart disease of native coronary artery without angina pectoris: Secondary | ICD-10-CM | POA: Diagnosis not present

## 2021-01-02 DIAGNOSIS — M1712 Unilateral primary osteoarthritis, left knee: Secondary | ICD-10-CM | POA: Diagnosis not present

## 2021-01-02 DIAGNOSIS — M25562 Pain in left knee: Secondary | ICD-10-CM | POA: Diagnosis not present

## 2021-01-08 DIAGNOSIS — Z1159 Encounter for screening for other viral diseases: Secondary | ICD-10-CM | POA: Diagnosis not present

## 2021-01-08 DIAGNOSIS — Z0001 Encounter for general adult medical examination with abnormal findings: Secondary | ICD-10-CM | POA: Diagnosis not present

## 2021-01-08 DIAGNOSIS — E291 Testicular hypofunction: Secondary | ICD-10-CM | POA: Diagnosis not present

## 2021-01-08 DIAGNOSIS — R739 Hyperglycemia, unspecified: Secondary | ICD-10-CM | POA: Diagnosis not present

## 2021-01-08 DIAGNOSIS — I10 Essential (primary) hypertension: Secondary | ICD-10-CM | POA: Diagnosis not present

## 2021-01-08 DIAGNOSIS — K219 Gastro-esophageal reflux disease without esophagitis: Secondary | ICD-10-CM | POA: Diagnosis not present

## 2021-01-08 DIAGNOSIS — Z13220 Encounter for screening for lipoid disorders: Secondary | ICD-10-CM | POA: Diagnosis not present

## 2021-01-10 DIAGNOSIS — Z1389 Encounter for screening for other disorder: Secondary | ICD-10-CM | POA: Diagnosis not present

## 2021-01-10 DIAGNOSIS — J449 Chronic obstructive pulmonary disease, unspecified: Secondary | ICD-10-CM | POA: Diagnosis not present

## 2021-01-10 DIAGNOSIS — I2510 Atherosclerotic heart disease of native coronary artery without angina pectoris: Secondary | ICD-10-CM | POA: Diagnosis not present

## 2021-01-10 DIAGNOSIS — I10 Essential (primary) hypertension: Secondary | ICD-10-CM | POA: Diagnosis not present

## 2021-01-10 DIAGNOSIS — E291 Testicular hypofunction: Secondary | ICD-10-CM | POA: Diagnosis not present

## 2021-01-10 DIAGNOSIS — I5032 Chronic diastolic (congestive) heart failure: Secondary | ICD-10-CM | POA: Diagnosis not present

## 2021-01-10 DIAGNOSIS — Z1331 Encounter for screening for depression: Secondary | ICD-10-CM | POA: Diagnosis not present

## 2021-01-10 DIAGNOSIS — N481 Balanitis: Secondary | ICD-10-CM | POA: Diagnosis not present

## 2021-01-23 ENCOUNTER — Other Ambulatory Visit: Payer: Self-pay | Admitting: Cardiology

## 2021-01-24 DIAGNOSIS — G4733 Obstructive sleep apnea (adult) (pediatric): Secondary | ICD-10-CM | POA: Diagnosis not present

## 2021-02-12 ENCOUNTER — Encounter: Payer: Self-pay | Admitting: *Deleted

## 2021-02-12 ENCOUNTER — Encounter: Payer: Self-pay | Admitting: Student

## 2021-02-12 ENCOUNTER — Other Ambulatory Visit: Payer: Self-pay

## 2021-02-12 ENCOUNTER — Ambulatory Visit: Payer: Medicare PPO | Admitting: Student

## 2021-02-12 ENCOUNTER — Encounter (INDEPENDENT_AMBULATORY_CARE_PROVIDER_SITE_OTHER): Payer: Self-pay

## 2021-02-12 VITALS — BP 116/62 | HR 62 | Wt 231.6 lb

## 2021-02-12 DIAGNOSIS — I10 Essential (primary) hypertension: Secondary | ICD-10-CM

## 2021-02-12 DIAGNOSIS — E785 Hyperlipidemia, unspecified: Secondary | ICD-10-CM

## 2021-02-12 DIAGNOSIS — R600 Localized edema: Secondary | ICD-10-CM | POA: Diagnosis not present

## 2021-02-12 DIAGNOSIS — I25118 Atherosclerotic heart disease of native coronary artery with other forms of angina pectoris: Secondary | ICD-10-CM | POA: Diagnosis not present

## 2021-02-12 DIAGNOSIS — G4733 Obstructive sleep apnea (adult) (pediatric): Secondary | ICD-10-CM | POA: Diagnosis not present

## 2021-02-12 DIAGNOSIS — Z9989 Dependence on other enabling machines and devices: Secondary | ICD-10-CM | POA: Diagnosis not present

## 2021-02-12 NOTE — Patient Instructions (Signed)
Medication Instructions:  Your physician recommends that you continue on your current medications as directed. Please refer to the Current Medication list given to you today.  *If you need a refill on your cardiac medications before your next appointment, please call your pharmacy*   Lab Work: NONE   If you have labs (blood work) drawn today and your tests are completely normal, you will receive your results only by: . MyChart Message (if you have MyChart) OR . A paper copy in the mail If you have any lab test that is abnormal or we need to change your treatment, we will call you to review the results.   Testing/Procedures: NONE    Follow-Up: At [facility], you and your health needs are our priority.  As part of our continuing mission to provide you with exceptional heart care, we have created designated Provider Care Teams.  These Care Teams include your primary Cardiologist (physician) and Advanced Practice Providers (APPs -  Physician Assistants and Nurse Practitioners) who all work together to provide you with the care you need, when you need it.  We recommend signing up for the patient portal called "MyChart".  Sign up information is provided on this After Visit Summary.  MyChart is used to connect with patients for Virtual Visits (Telemedicine).  Patients are able to view lab/test results, encounter notes, upcoming appointments, etc.  Non-urgent messages can be sent to your provider as well.   To learn more about what you can do with MyChart, go to [email or web address].    Your next appointment:   6 month(s)  The format for your next appointment:   In Person  Provider:   Brittany Strader, PA-C   Other Instructions Thank you for choosing [facility]!    

## 2021-02-12 NOTE — Progress Notes (Signed)
Cardiology Office Note    Date:  02/12/2021   ID:  [name], DOB 1951-03-06, MRN [id number]  PCP:  Rosalee Kaufman, PA-C  Cardiologist: Previously Dr. Bronson Ing --> Needs to switch to new MD  Chief Complaint  Patient presents with   Follow-up    6 month visit    History of Present Illness:    [name] is a 70 y.o. male  with past medical history of CAD (s/p DES to LAD and PTCA of D1 in 2010, cath in 2016 showing patent stent with 50% stenosis of jailed D1, cath in 10/2017 showing patent LAD stent with 70% D1 stenosis and 80% RV branch stenosis and medical management recommended), HTN, HLD, OSA (on CPAP) who presents to the office today for 22-month follow-up.  He was last examined by myself in 07/2020 and reported occasional episodes of angina in the evening but denied any exertional symptoms. He was still taking high-dose Lasix 120 mg daily as he reported having worsening edema when on a lower dose in the past.  Options in regard to repeat ischemic evaluation were reviewed and was recommended he continue on Imdur 120 mg daily with the addition of 30 mg in the evening given his symptoms in the evening hours. He was continued on ASA, Atorvastatin and Toprol-XL at current dosing  In talking with the patient today, he reports overall doing well since his last office visit. He has made significant dietary changes throughout the years and has lost 80+ pounds during that timeframe. He reports improvement in his overall energy level with these changes. He denies any recent exertional chest pain and reports his respiratory status has overall been stable. No recent orthopnea, PND or lower extremity edema. He does use his CPAP on a nightly basis. He did self-reduce his Lasix from 120 mg daily to 80 mg daily and denies any recurrent edema.  Past Medical History:  Diagnosis Date   Anxiety    HX PANIC ATTACKS   CHF (congestive heart failure) (HCC)    Chronic cough    Coronary  artery disease    a. s/p DES to LAD and PTCA of D1 in 2010 b. cath in 2016 showing patent stent with 50% stenosis of jailed D1 c. cath in 10/2017 showing patent LAD stent with 70% D1 stenosis and 80% RV branch stenosis and medical management recommended   Dry eyes    Fibromyalgia    Fluid retention    GERD (gastroesophageal reflux disease)    Headache(784.0)    Herpes genitalis in men    History of kidney stones    History of vertigo    Hypertension    Narcolepsy    Neuropathy    Penile lesion    Sjogren's disease ([place])    Sleep apnea    USES C-PAP    Past Surgical History:  Procedure Laterality Date   BREAST SURGERY     BIL MASTECTOMY  FOR BENIGN LUMPS   CARDIAC CATHETERIZATION N/A 01/09/2015   Procedure: Left Heart Cath and Coronary Angiography;  Surgeon: Leonie Man, MD;  Location: [place] [facility];  Service: Cardiovascular;  Laterality: N/A;   CARPAL TUNNELL  BIL   CIRCUMCISION     CORONARY ANGIOPLASTY WITH STENT PLACEMENT  X1 2010   HERNIA REPAIR     ING HERNIA REPAIR   LEFT HEART CATH AND CORONARY ANGIOGRAPHY N/A 10/15/2017   Procedure: LEFT HEART CATH AND CORONARY ANGIOGRAPHY;  Surgeon: Martinique, Peter M, MD;  Location: [facility];  Service: Cardiovascular;  Laterality: N/A;   LEFT HEART CATHETERIZATION WITH CORONARY ANGIOGRAM N/A 10/11/2013   Procedure: LEFT HEART CATHETERIZATION WITH CORONARY ANGIOGRAM;  Surgeon: Sinclair Grooms, MD;  Location: [facility] CATH LAB;  Service: Cardiovascular;  Laterality: N/A;   LEFT HEART CATHETERIZATION WITH CORONARY ANGIOGRAM N/A 06/21/2014   Procedure: LEFT HEART CATHETERIZATION WITH CORONARY ANGIOGRAM;  Surgeon: Troy Sine, MD;  Location: [facility] CATH LAB;  Service: Cardiovascular;  Laterality: N/A;   MEATOTOMY  X2   PERCUTANEOUS CORONARY STENT INTERVENTION (PCI-S) N/A 06/23/2014   Procedure: PERCUTANEOUS CORONARY STENT INTERVENTION (PCI-S);  Surgeon: Jettie Booze, MD;  Location: [facility] LAB;  Service: Cardiovascular;   Laterality: N/A;   VASECTOMY     WRIST FRACTURE SURGERY  L  X3 SURGERIES    Current Medications: Outpatient Medications Prior to Visit  Medication Sig Dispense Refill   aspirin EC 81 MG tablet Take 81 mg by mouth at bedtime.      atorvastatin (LIPITOR) 40 MG tablet TAKE 1 TABLET IN THE EVENING 90 tablet 3   b complex vitamins tablet Take 1 tablet by mouth daily.     betamethasone valerate ointment (VALISONE) 0.1 % Apply 1 application topically 2 (two) times daily as needed (itching).     Butalbital-APAP-Caffeine 50-325-40 MG capsule Take 1 capsule by mouth 3 (three) times daily as needed for headache.      Cholecalciferol (VITAMIN D) 2000 units tablet Take 2,000 Units by mouth daily.     citalopram (CELEXA) 20 MG tablet Take 20 mg by mouth daily.      clonazePAM (KLONOPIN) 0.5 MG tablet Take 0.5 mg by mouth 2 (two) times daily as needed for anxiety.      diphenhydrAMINE (BENADRYL) 25 MG tablet Take 25-50 mg by mouth 2 (two) times daily as needed for itching.      fluticasone (CUTIVATE) 0.05 % cream Apply 1 application topically 2 (two) times daily as needed (rash). Applied to face and/or penis area(s)     furosemide (LASIX) 40 MG tablet Take 80 mg by mouth daily.     guaifenesin (HUMIBID E) 400 MG TABS tablet Take 800 mg by mouth daily as needed (for post nasal drip/cough).      HYDROmorphone (DILAUDID) 4 MG tablet Take 4 mg by mouth 4 (four) times daily as needed for severe pain.     ibuprofen (ADVIL,MOTRIN) 200 MG tablet Take 600 mg by mouth 4 (four) times daily as needed for mild pain or moderate pain.      isosorbide mononitrate (IMDUR) 120 MG 24 hr tablet TAKE 1 TABLET EVERY DAY (Patient taking differently: 120 mg. Take 120 mg in the AM and 30 mg in the PM) 90 tablet 3   isosorbide mononitrate (IMDUR) 30 MG 24 hr tablet Take 1 tablet (30 mg total) by mouth at bedtime. 90 tablet 3   L-Lysine 500 MG TABS Take 500 mg by mouth daily.     loperamide (IMODIUM A-D) 2 MG tablet Take 6 mg by  mouth 4 (four) times daily as needed for diarrhea or loose stools.     metoprolol succinate (TOPROL-XL) 50 MG 24 hr tablet Take 1 tablet (50 mg total) by mouth daily. Take with or immediately following a meal. 90 tablet 3   Multiple Vitamins-Minerals (MULTIVITAMIN WITH MINERALS) tablet Take 1 tablet by mouth every evening.      nitroGLYCERIN (NITROSTAT) 0.4 MG SL tablet Take one every 5 minutes as needed for chest  pain 25 tablet 3   NON FORMULARY at bedtime. CPAP     omeprazole (PRILOSEC) 40 MG capsule Take 40 mg by mouth daily before breakfast.     polyethylene glycol (MIRALAX / GLYCOLAX) packet Take 17 g by mouth daily as needed for moderate constipation.     polyvinyl alcohol-povidone (HYPOTEARS) 1.4-0.6 % ophthalmic solution Place 1-2 drops into both eyes daily as needed (dry eyes). For dry eyes     potassium chloride (K-DUR) 10 MEQ tablet Take 10-20 mEq by mouth See admin instructions. 2 tablets in AM and 1 tablet in PM     testosterone cypionate (DEPOTESTOSTERONE CYPIONATE) 200 MG/ML injection Inject 100 mg into the muscle every 14 (fourteen) days.      topiramate (TOPAMAX) 25 MG tablet Take 50 mg by mouth at bedtime.     valACYclovir (VALTREX) 500 MG tablet Take 500 mg by mouth 2 (two) times daily as needed (fever blisters).      vitamin C (ASCORBIC ACID) 500 MG tablet Take 500 mg by mouth every morning.     gabapentin (NEURONTIN) 600 MG tablet Take 1,800 mg by mouth 2 (two) times daily. (Patient not taking: Reported on 02/12/2021)     No facility-administered medications prior to visit.     Allergies:   Bee venom, Duloxetine, Fentanyl, Lidocaine, Meperidine hcl, Methadone, Oxycodone hcl, Pregabalin, Zolpidem, and Zolpidem tartrate   Social History   Socioeconomic History   Marital status: Married    Spouse name: Not on file   Number of children: Not on file   Years of education: Not on file   Highest education level: Not on file  Occupational History   Not on file  Tobacco Use    Smoking status: Former    Packs/day: 2.50    Years: 32.00    Pack years: 80.00    Types: Cigarettes    Start date: 04/17/1967    Quit date: 05/12/2000    Years since quitting: 20.7   Smokeless tobacco: Never  Vaping Use   Vaping Use: Never used  Substance and Sexual Activity   Alcohol use: No    Alcohol/week: 0.0 standard drinks    Comment: RECOVERING ALCOHOLIC 9 YRS- 4656   Drug use: No   Sexual activity: Not on file  Other Topics Concern   Not on file  Social History Narrative   Not on file   Social Determinants of Health   Financial Resource Strain: Not on file  Food Insecurity: Not on file  Transportation Needs: Not on file  Physical Activity: Not on file  Stress: Not on file  Social Connections: Not on file     Family History:  The patient's family history includes Heart disease in an other family member.   Review of Systems:    Please see the history of present illness.     All other systems reviewed and are otherwise negative except as noted above.   Physical Exam:    VS:  BP 116/62   Pulse 62   Wt 231 lb 9.6 oz (105.1 kg)   SpO2 97%   BMI 35.21 kg/m    General: Well developed, well nourished,male appearing in no acute distress. Head: Normocephalic, atraumatic. Neck: No carotid bruits. JVD not elevated.  Lungs: Respirations regular and unlabored, without wheezes or rales.  Heart: Regular rate and rhythm. No S3 or S4.  No murmur, no rubs, or gallops appreciated. Abdomen: Appears non-distended. No obvious abdominal masses. Msk:  Strength and tone appear normal for  age. No obvious joint deformities or effusions. Extremities: No clubbing or cyanosis. No pitting edema.  Distal pedal pulses are 2+ bilaterally. Neuro: Alert and oriented X 3. Moves all extremities spontaneously. No focal deficits noted. Psych:  Responds to questions appropriately with a normal affect. Skin: No rashes or lesions noted  Wt Readings from Last 3 Encounters:  02/12/21 231 lb 9.6  oz (105.1 kg)  07/31/20 236 lb (107 kg)  02/01/20 243 lb 12.8 oz (110.6 kg)     Studies/Labs Reviewed:   EKG:  EKG is ordered today.  The ekg ordered today demonstrates sinus bradycardia, HR 59 with LAFB. No acute ST changes.   Recent Labs: No results found for requested labs within last 8760 hours.   Lipid Panel    Component Value Date/Time   CHOL 168 10/11/2013 0357   TRIG 172 (H) 10/11/2013 0357   HDL 34 (L) 10/11/2013 0357   CHOLHDL 4.9 10/11/2013 0357   VLDL 34 10/11/2013 0357   LDLCALC 100 (H) 10/11/2013 0357    Additional studies/ records that were reviewed today include:   Cardiac Catheterization: 10/2017 Ost 1st Diag to 1st Diag lesion is 70% stenosed. Ost 2nd Diag lesion is 50% stenosed. Acute Mrg lesion is 80% stenosed. Previously placed Prox LAD to Mid LAD stent (unknown type) is widely patent. The left ventricular systolic function is normal. LV end diastolic pressure is normal. The left ventricular ejection fraction is 55-65% by visual estimate.   1. Small vessel obstructive CAD with 70% ostial first diagonal ( jailed by stent) and 80% ostial RV marginal branch 2. Continued patency of stents in the LAD 3. Normal LV function 4. Normal LVEDP   Plan: really no change from last cardiac cath in August 2016. Recommend continued medical therapy.    Assessment:    1. Coronary artery disease of native artery of native heart with stable angina pectoris ([place])   2. Bilateral lower extremity edema   3. Essential hypertension   4. Hyperlipidemia LDL goal <70   5. OSA on CPAP      Plan:   In order of problems listed above:  1. CAD - He is s/p DES to LAD and PTCA of D1 in 2010 with cath in 2016 showing patent stent with 50% stenosis of jailed D1 and cath in 10/2017 showing patent LAD stent with 70% D1 stenosis and 80% RV branch stenosis and medical management recommended. - He denies any recent anginal symptoms.  - Continue current medical therapy with ASA  81mg  daily, Atorvastatin 40mg  daily, Imdur 120mg  in AM/30mg  in PM and Toprol-XL 50mg  daily.   2. Lower Extremity Edema - His symptoms have overall improved with weight loss and changes in his dietary intake. He was able to reduce his Lasix from 120 mg daily to 80mg  daily and denies any issues with volume overload. Appears euvolemic on examination today. Continue at current dosing for now given the recent change and can perhaps further reduce to 40mg  daily at his next visit if symptoms remain well-controlled.   3. HTN - His BP is well-controlled at 116/62 during today's visit. Continue current medication regimen with Imdur 120mg  in AM/30mg  in PM and Toprol-XL 50mg  daily.   4. HLD - His LDL was at 59 in 06/2020. He did have repeat labs with his PCP last month and we will request a copy. He remains on Atorvastatin 40mg  daily.   5. OSA - Continued compliance with CPAP encouraged.   Medication Adjustments/Labs and Tests Ordered: Current  medicines are reviewed at length with the patient today.  Concerns regarding medicines are outlined above.  Medication changes, Labs and Tests ordered today are listed in the Patient Instructions below. Patient Instructions  Medication Instructions:  Your physician recommends that you continue on your current medications as directed. Please refer to the Current Medication list given to you today.  *If you need a refill on your cardiac medications before your next appointment, please call your pharmacy*   Lab Work: NONE   If you have labs (blood work) drawn today and your tests are completely normal, you will receive your results only by: [place] (if you have MyChart) OR A paper copy in the mail If you have any lab test that is abnormal or we need to change your treatment, we will call you to review the results.   Testing/Procedures: NONE    Follow-Up: At [facility], you and your health needs are our priority.  As part of our continuing  mission to provide you with exceptional heart care, we have created designated Provider Care Teams.  These Care Teams include your primary Cardiologist (physician) and Advanced Practice Providers (APPs -  Physician Assistants and Nurse Practitioners) who all work together to provide you with the care you need, when you need it.  We recommend signing up for the patient portal called "MyChart".  Sign up information is provided on this After Visit Summary.  MyChart is used to connect with patients for Virtual Visits (Telemedicine).  Patients are able to view lab/test results, encounter notes, upcoming appointments, etc.  Non-urgent messages can be sent to your provider as well.   To learn more about what you can do with MyChart, go to [email or web address].    Your next appointment:   6 month(s)  The format for your next appointment:   In Person  Provider:   Bernerd Pho, PA-C   Other Instructions Thank you for choosing [facility]!     Signed, Erma Heritage, PA-C  02/12/2021 7:18 PM    [place] [address] [place], [place] [address] Phone: [phone] Fax: [phone]

## 2021-02-27 DIAGNOSIS — M545 Low back pain, unspecified: Secondary | ICD-10-CM | POA: Diagnosis not present

## 2021-02-27 DIAGNOSIS — M542 Cervicalgia: Secondary | ICD-10-CM | POA: Diagnosis not present

## 2021-02-27 DIAGNOSIS — R2689 Other abnormalities of gait and mobility: Secondary | ICD-10-CM | POA: Diagnosis not present

## 2021-02-27 DIAGNOSIS — M79643 Pain in unspecified hand: Secondary | ICD-10-CM | POA: Diagnosis not present

## 2021-02-27 DIAGNOSIS — G4489 Other headache syndrome: Secondary | ICD-10-CM | POA: Diagnosis not present

## 2021-02-27 DIAGNOSIS — E1142 Type 2 diabetes mellitus with diabetic polyneuropathy: Secondary | ICD-10-CM | POA: Diagnosis not present

## 2021-02-27 DIAGNOSIS — M5416 Radiculopathy, lumbar region: Secondary | ICD-10-CM | POA: Diagnosis not present

## 2021-02-27 DIAGNOSIS — Z79891 Long term (current) use of opiate analgesic: Secondary | ICD-10-CM | POA: Diagnosis not present

## 2021-02-27 DIAGNOSIS — M79606 Pain in leg, unspecified: Secondary | ICD-10-CM | POA: Diagnosis not present

## 2021-05-01 DIAGNOSIS — M1712 Unilateral primary osteoarthritis, left knee: Secondary | ICD-10-CM | POA: Diagnosis not present

## 2021-05-01 DIAGNOSIS — M25562 Pain in left knee: Secondary | ICD-10-CM | POA: Diagnosis not present

## 2021-05-14 DIAGNOSIS — R739 Hyperglycemia, unspecified: Secondary | ICD-10-CM | POA: Diagnosis not present

## 2021-05-14 DIAGNOSIS — Z13220 Encounter for screening for lipoid disorders: Secondary | ICD-10-CM | POA: Diagnosis not present

## 2021-05-14 DIAGNOSIS — Z131 Encounter for screening for diabetes mellitus: Secondary | ICD-10-CM | POA: Diagnosis not present

## 2021-05-14 DIAGNOSIS — M797 Fibromyalgia: Secondary | ICD-10-CM | POA: Diagnosis not present

## 2021-05-14 DIAGNOSIS — R947 Abnormal results of other endocrine function studies: Secondary | ICD-10-CM | POA: Diagnosis not present

## 2021-05-14 DIAGNOSIS — I10 Essential (primary) hypertension: Secondary | ICD-10-CM | POA: Diagnosis not present

## 2021-05-14 DIAGNOSIS — Z1159 Encounter for screening for other viral diseases: Secondary | ICD-10-CM | POA: Diagnosis not present

## 2021-05-14 DIAGNOSIS — K219 Gastro-esophageal reflux disease without esophagitis: Secondary | ICD-10-CM | POA: Diagnosis not present

## 2021-05-16 DIAGNOSIS — Z202 Contact with and (suspected) exposure to infections with a predominantly sexual mode of transmission: Secondary | ICD-10-CM | POA: Diagnosis not present

## 2021-05-16 DIAGNOSIS — I10 Essential (primary) hypertension: Secondary | ICD-10-CM | POA: Diagnosis not present

## 2021-05-16 DIAGNOSIS — E291 Testicular hypofunction: Secondary | ICD-10-CM | POA: Diagnosis not present

## 2021-05-16 DIAGNOSIS — J449 Chronic obstructive pulmonary disease, unspecified: Secondary | ICD-10-CM | POA: Diagnosis not present

## 2021-05-16 DIAGNOSIS — F419 Anxiety disorder, unspecified: Secondary | ICD-10-CM | POA: Diagnosis not present

## 2021-05-16 DIAGNOSIS — Z6834 Body mass index (BMI) 34.0-34.9, adult: Secondary | ICD-10-CM | POA: Diagnosis not present

## 2021-05-16 DIAGNOSIS — I5032 Chronic diastolic (congestive) heart failure: Secondary | ICD-10-CM | POA: Diagnosis not present

## 2021-05-16 DIAGNOSIS — Z113 Encounter for screening for infections with a predominantly sexual mode of transmission: Secondary | ICD-10-CM | POA: Diagnosis not present

## 2021-05-16 DIAGNOSIS — K219 Gastro-esophageal reflux disease without esophagitis: Secondary | ICD-10-CM | POA: Diagnosis not present

## 2021-05-16 DIAGNOSIS — N481 Balanitis: Secondary | ICD-10-CM | POA: Diagnosis not present

## 2021-05-16 DIAGNOSIS — Z0001 Encounter for general adult medical examination with abnormal findings: Secondary | ICD-10-CM | POA: Diagnosis not present

## 2021-05-16 DIAGNOSIS — I2510 Atherosclerotic heart disease of native coronary artery without angina pectoris: Secondary | ICD-10-CM | POA: Diagnosis not present

## 2021-05-22 ENCOUNTER — Other Ambulatory Visit: Payer: Self-pay | Admitting: Student

## 2021-05-22 DIAGNOSIS — E1142 Type 2 diabetes mellitus with diabetic polyneuropathy: Secondary | ICD-10-CM | POA: Diagnosis not present

## 2021-05-22 DIAGNOSIS — R2689 Other abnormalities of gait and mobility: Secondary | ICD-10-CM | POA: Diagnosis not present

## 2021-05-22 DIAGNOSIS — Z79891 Long term (current) use of opiate analgesic: Secondary | ICD-10-CM | POA: Diagnosis not present

## 2021-05-22 DIAGNOSIS — M5416 Radiculopathy, lumbar region: Secondary | ICD-10-CM | POA: Diagnosis not present

## 2021-05-22 DIAGNOSIS — G4489 Other headache syndrome: Secondary | ICD-10-CM | POA: Diagnosis not present

## 2021-05-22 DIAGNOSIS — M79643 Pain in unspecified hand: Secondary | ICD-10-CM | POA: Diagnosis not present

## 2021-05-22 DIAGNOSIS — M545 Low back pain, unspecified: Secondary | ICD-10-CM | POA: Diagnosis not present

## 2021-05-22 DIAGNOSIS — M542 Cervicalgia: Secondary | ICD-10-CM | POA: Diagnosis not present

## 2021-05-22 DIAGNOSIS — M79606 Pain in leg, unspecified: Secondary | ICD-10-CM | POA: Diagnosis not present

## 2021-08-27 DIAGNOSIS — M79606 Pain in leg, unspecified: Secondary | ICD-10-CM | POA: Diagnosis not present

## 2021-08-27 DIAGNOSIS — R2689 Other abnormalities of gait and mobility: Secondary | ICD-10-CM | POA: Diagnosis not present

## 2021-08-27 DIAGNOSIS — M5416 Radiculopathy, lumbar region: Secondary | ICD-10-CM | POA: Diagnosis not present

## 2021-08-27 DIAGNOSIS — E1142 Type 2 diabetes mellitus with diabetic polyneuropathy: Secondary | ICD-10-CM | POA: Diagnosis not present

## 2021-08-27 DIAGNOSIS — M545 Low back pain, unspecified: Secondary | ICD-10-CM | POA: Diagnosis not present

## 2021-08-27 DIAGNOSIS — M542 Cervicalgia: Secondary | ICD-10-CM | POA: Diagnosis not present

## 2021-08-27 DIAGNOSIS — M79643 Pain in unspecified hand: Secondary | ICD-10-CM | POA: Diagnosis not present

## 2021-08-27 DIAGNOSIS — G4459 Other complicated headache syndrome: Secondary | ICD-10-CM | POA: Diagnosis not present

## 2021-08-27 DIAGNOSIS — Z79891 Long term (current) use of opiate analgesic: Secondary | ICD-10-CM | POA: Diagnosis not present

## 2021-09-03 DIAGNOSIS — Z13220 Encounter for screening for lipoid disorders: Secondary | ICD-10-CM | POA: Diagnosis not present

## 2021-09-03 DIAGNOSIS — R739 Hyperglycemia, unspecified: Secondary | ICD-10-CM | POA: Diagnosis not present

## 2021-09-03 DIAGNOSIS — K219 Gastro-esophageal reflux disease without esophagitis: Secondary | ICD-10-CM | POA: Diagnosis not present

## 2021-09-03 DIAGNOSIS — J449 Chronic obstructive pulmonary disease, unspecified: Secondary | ICD-10-CM | POA: Diagnosis not present

## 2021-09-03 DIAGNOSIS — E291 Testicular hypofunction: Secondary | ICD-10-CM | POA: Diagnosis not present

## 2021-09-03 DIAGNOSIS — Z1159 Encounter for screening for other viral diseases: Secondary | ICD-10-CM | POA: Diagnosis not present

## 2021-09-03 DIAGNOSIS — Z131 Encounter for screening for diabetes mellitus: Secondary | ICD-10-CM | POA: Diagnosis not present

## 2021-09-03 DIAGNOSIS — I10 Essential (primary) hypertension: Secondary | ICD-10-CM | POA: Diagnosis not present

## 2021-09-05 DIAGNOSIS — F419 Anxiety disorder, unspecified: Secondary | ICD-10-CM | POA: Diagnosis not present

## 2021-09-05 DIAGNOSIS — N481 Balanitis: Secondary | ICD-10-CM | POA: Diagnosis not present

## 2021-09-05 DIAGNOSIS — J449 Chronic obstructive pulmonary disease, unspecified: Secondary | ICD-10-CM | POA: Diagnosis not present

## 2021-09-05 DIAGNOSIS — I2510 Atherosclerotic heart disease of native coronary artery without angina pectoris: Secondary | ICD-10-CM | POA: Diagnosis not present

## 2021-09-05 DIAGNOSIS — I5032 Chronic diastolic (congestive) heart failure: Secondary | ICD-10-CM | POA: Diagnosis not present

## 2021-09-05 DIAGNOSIS — I10 Essential (primary) hypertension: Secondary | ICD-10-CM | POA: Diagnosis not present

## 2021-09-05 DIAGNOSIS — E291 Testicular hypofunction: Secondary | ICD-10-CM | POA: Diagnosis not present

## 2021-09-05 DIAGNOSIS — K219 Gastro-esophageal reflux disease without esophagitis: Secondary | ICD-10-CM | POA: Diagnosis not present

## 2021-10-10 ENCOUNTER — Telehealth: Payer: Self-pay | Admitting: Student

## 2021-10-10 MED ORDER — NITROGLYCERIN 0.4 MG SL SUBL: SUBLINGUAL_TABLET | SUBLINGUAL | 3 refills | Status: DC

## 2021-10-10 NOTE — Telephone Encounter (Signed)
Refill complete 

## 2021-10-10 NOTE — Telephone Encounter (Signed)
*  STAT* If patient is at the pharmacy, call can be transferred to refill team.   1. Which medications need to be refilled? (please list name of each medication and dose if known) nitroGLYCERIN (NITROSTAT) 0.4 MG SL tablet  2. Which pharmacy/location (including street and city if local pharmacy) is medication to be sent to? [facility] [phone]  8. Do they need a 30 day or 90 day supply? Patient would like 2 bottles.

## 2021-11-10 ENCOUNTER — Other Ambulatory Visit: Payer: Self-pay | Admitting: Student

## 2021-12-06 DIAGNOSIS — M419 Scoliosis, unspecified: Secondary | ICD-10-CM | POA: Diagnosis not present

## 2021-12-06 DIAGNOSIS — R109 Unspecified abdominal pain: Secondary | ICD-10-CM | POA: Diagnosis not present

## 2021-12-06 DIAGNOSIS — I2510 Atherosclerotic heart disease of native coronary artery without angina pectoris: Secondary | ICD-10-CM | POA: Diagnosis not present

## 2021-12-06 DIAGNOSIS — I252 Old myocardial infarction: Secondary | ICD-10-CM | POA: Diagnosis not present

## 2021-12-06 DIAGNOSIS — E785 Hyperlipidemia, unspecified: Secondary | ICD-10-CM | POA: Diagnosis not present

## 2021-12-06 DIAGNOSIS — I10 Essential (primary) hypertension: Secondary | ICD-10-CM | POA: Diagnosis not present

## 2021-12-06 DIAGNOSIS — Z7982 Long term (current) use of aspirin: Secondary | ICD-10-CM | POA: Diagnosis not present

## 2021-12-06 DIAGNOSIS — Z87891 Personal history of nicotine dependence: Secondary | ICD-10-CM | POA: Diagnosis not present

## 2021-12-06 DIAGNOSIS — N289 Disorder of kidney and ureter, unspecified: Secondary | ICD-10-CM | POA: Diagnosis not present

## 2021-12-06 DIAGNOSIS — R197 Diarrhea, unspecified: Secondary | ICD-10-CM | POA: Diagnosis not present

## 2021-12-06 DIAGNOSIS — M797 Fibromyalgia: Secondary | ICD-10-CM | POA: Diagnosis not present

## 2021-12-06 DIAGNOSIS — R918 Other nonspecific abnormal finding of lung field: Secondary | ICD-10-CM | POA: Diagnosis not present

## 2021-12-13 ENCOUNTER — Emergency Department (HOSPITAL_COMMUNITY): Payer: Medicare PPO

## 2021-12-13 ENCOUNTER — Encounter (HOSPITAL_COMMUNITY): Payer: Self-pay | Admitting: Emergency Medicine

## 2021-12-13 ENCOUNTER — Inpatient Hospital Stay (HOSPITAL_COMMUNITY)
Admission: EM | Admit: 2021-12-13 | Discharge: 2021-12-19 | DRG: 371 | Disposition: A | Discharge status: Skilled Nursing Facility | Payer: Medicare PPO | Attending: Internal Medicine | Admitting: Internal Medicine

## 2021-12-13 ENCOUNTER — Other Ambulatory Visit: Payer: Self-pay

## 2021-12-13 DIAGNOSIS — R079 Chest pain, unspecified: Secondary | ICD-10-CM | POA: Diagnosis not present

## 2021-12-13 DIAGNOSIS — Z7982 Long term (current) use of aspirin: Secondary | ICD-10-CM

## 2021-12-13 DIAGNOSIS — Z9989 Dependence on other enabling machines and devices: Secondary | ICD-10-CM | POA: Diagnosis not present

## 2021-12-13 DIAGNOSIS — R17 Unspecified jaundice: Secondary | ICD-10-CM | POA: Diagnosis not present

## 2021-12-13 DIAGNOSIS — A042 Enteroinvasive Escherichia coli infection: Secondary | ICD-10-CM | POA: Diagnosis present

## 2021-12-13 DIAGNOSIS — E861 Hypovolemia: Secondary | ICD-10-CM | POA: Diagnosis present

## 2021-12-13 DIAGNOSIS — A030 Shigellosis due to Shigella dysenteriae: Secondary | ICD-10-CM | POA: Diagnosis not present

## 2021-12-13 DIAGNOSIS — I4581 Long QT syndrome: Secondary | ICD-10-CM

## 2021-12-13 DIAGNOSIS — E871 Hypo-osmolality and hyponatremia: Secondary | ICD-10-CM | POA: Diagnosis not present

## 2021-12-13 DIAGNOSIS — E669 Obesity, unspecified: Secondary | ICD-10-CM | POA: Diagnosis present

## 2021-12-13 DIAGNOSIS — Z885 Allergy status to narcotic agent status: Secondary | ICD-10-CM

## 2021-12-13 DIAGNOSIS — R197 Diarrhea, unspecified: Secondary | ICD-10-CM | POA: Diagnosis not present

## 2021-12-13 DIAGNOSIS — I5032 Chronic diastolic (congestive) heart failure: Secondary | ICD-10-CM | POA: Diagnosis not present

## 2021-12-13 DIAGNOSIS — D751 Secondary polycythemia: Secondary | ICD-10-CM | POA: Diagnosis present

## 2021-12-13 DIAGNOSIS — R296 Repeated falls: Secondary | ICD-10-CM | POA: Diagnosis present

## 2021-12-13 DIAGNOSIS — Z9861 Coronary angioplasty status: Secondary | ICD-10-CM

## 2021-12-13 DIAGNOSIS — K51011 Ulcerative (chronic) pancolitis with rectal bleeding: Secondary | ICD-10-CM | POA: Diagnosis not present

## 2021-12-13 DIAGNOSIS — Z87891 Personal history of nicotine dependence: Secondary | ICD-10-CM

## 2021-12-13 DIAGNOSIS — Z955 Presence of coronary angioplasty implant and graft: Secondary | ICD-10-CM

## 2021-12-13 DIAGNOSIS — Z800 Family history of malignant neoplasm of digestive organs: Secondary | ICD-10-CM | POA: Diagnosis not present

## 2021-12-13 DIAGNOSIS — Z91030 Bee allergy status: Secondary | ICD-10-CM

## 2021-12-13 DIAGNOSIS — I2510 Atherosclerotic heart disease of native coronary artery without angina pectoris: Secondary | ICD-10-CM | POA: Diagnosis present

## 2021-12-13 DIAGNOSIS — G4733 Obstructive sleep apnea (adult) (pediatric): Secondary | ICD-10-CM | POA: Diagnosis not present

## 2021-12-13 DIAGNOSIS — K529 Noninfective gastroenteritis and colitis, unspecified: Secondary | ICD-10-CM

## 2021-12-13 DIAGNOSIS — F32A Depression, unspecified: Secondary | ICD-10-CM | POA: Diagnosis not present

## 2021-12-13 DIAGNOSIS — D45 Polycythemia vera: Secondary | ICD-10-CM

## 2021-12-13 DIAGNOSIS — I10 Essential (primary) hypertension: Secondary | ICD-10-CM | POA: Diagnosis not present

## 2021-12-13 DIAGNOSIS — M797 Fibromyalgia: Secondary | ICD-10-CM | POA: Diagnosis present

## 2021-12-13 DIAGNOSIS — I110 Hypertensive heart disease with heart failure: Secondary | ICD-10-CM | POA: Diagnosis not present

## 2021-12-13 DIAGNOSIS — E860 Dehydration: Secondary | ICD-10-CM | POA: Diagnosis present

## 2021-12-13 DIAGNOSIS — K5100 Ulcerative (chronic) pancolitis without complications: Secondary | ICD-10-CM

## 2021-12-13 DIAGNOSIS — Z9013 Acquired absence of bilateral breasts and nipples: Secondary | ICD-10-CM

## 2021-12-13 DIAGNOSIS — R54 Age-related physical debility: Secondary | ICD-10-CM | POA: Diagnosis present

## 2021-12-13 DIAGNOSIS — E872 Acidosis, unspecified: Secondary | ICD-10-CM | POA: Diagnosis present

## 2021-12-13 DIAGNOSIS — Z888 Allergy status to other drugs, medicaments and biological substances status: Secondary | ICD-10-CM

## 2021-12-13 DIAGNOSIS — Z87442 Personal history of urinary calculi: Secondary | ICD-10-CM | POA: Diagnosis not present

## 2021-12-13 DIAGNOSIS — R9431 Abnormal electrocardiogram [ECG] [EKG]: Secondary | ICD-10-CM | POA: Diagnosis not present

## 2021-12-13 DIAGNOSIS — N179 Acute kidney failure, unspecified: Secondary | ICD-10-CM | POA: Diagnosis not present

## 2021-12-13 DIAGNOSIS — M3500 Sicca syndrome, unspecified: Secondary | ICD-10-CM | POA: Diagnosis present

## 2021-12-13 DIAGNOSIS — A039 Shigellosis, unspecified: Secondary | ICD-10-CM | POA: Diagnosis not present

## 2021-12-13 DIAGNOSIS — I700 Atherosclerosis of aorta: Secondary | ICD-10-CM | POA: Diagnosis not present

## 2021-12-13 DIAGNOSIS — F410 Panic disorder [episodic paroxysmal anxiety] without agoraphobia: Secondary | ICD-10-CM | POA: Diagnosis not present

## 2021-12-13 DIAGNOSIS — Z6832 Body mass index (BMI) 32.0-32.9, adult: Secondary | ICD-10-CM

## 2021-12-13 DIAGNOSIS — R109 Unspecified abdominal pain: Secondary | ICD-10-CM | POA: Diagnosis not present

## 2021-12-13 DIAGNOSIS — I252 Old myocardial infarction: Secondary | ICD-10-CM

## 2021-12-13 DIAGNOSIS — I5022 Chronic systolic (congestive) heart failure: Secondary | ICD-10-CM | POA: Diagnosis not present

## 2021-12-13 DIAGNOSIS — G629 Polyneuropathy, unspecified: Secondary | ICD-10-CM | POA: Diagnosis present

## 2021-12-13 DIAGNOSIS — R6520 Severe sepsis without septic shock: Secondary | ICD-10-CM | POA: Diagnosis present

## 2021-12-13 DIAGNOSIS — A419 Sepsis, unspecified organism: Secondary | ICD-10-CM

## 2021-12-13 DIAGNOSIS — E785 Hyperlipidemia, unspecified: Secondary | ICD-10-CM | POA: Diagnosis present

## 2021-12-13 DIAGNOSIS — K219 Gastro-esophageal reflux disease without esophagitis: Secondary | ICD-10-CM | POA: Diagnosis present

## 2021-12-13 DIAGNOSIS — Z79899 Other long term (current) drug therapy: Secondary | ICD-10-CM

## 2021-12-13 DIAGNOSIS — M6281 Muscle weakness (generalized): Secondary | ICD-10-CM | POA: Diagnosis not present

## 2021-12-13 DIAGNOSIS — E876 Hypokalemia: Secondary | ICD-10-CM | POA: Diagnosis present

## 2021-12-13 LAB — COMPREHENSIVE METABOLIC PANEL
ALT: 22 U/L (ref 0–44)
AST: 24 U/L (ref 15–41)
Albumin: 2.5 g/dL — ABNORMAL LOW (ref 3.5–5.0)
Alkaline Phosphatase: 62 U/L (ref 38–126)
Anion gap: 14 (ref 5–15)
BUN: 32 mg/dL — ABNORMAL HIGH (ref 8–23)
CO2: 20 mmol/L — ABNORMAL LOW (ref 22–32)
Calcium: 8.3 mg/dL — ABNORMAL LOW (ref 8.9–10.3)
Chloride: 91 mmol/L — ABNORMAL LOW (ref 98–111)
Creatinine, Ser: 2.26 mg/dL — ABNORMAL HIGH (ref 0.61–1.24)
GFR, Estimated: 30 mL/min — ABNORMAL LOW (ref >60)
Glucose, Bld: 156 mg/dL — ABNORMAL HIGH (ref 70–99)
Potassium: 3.1 mmol/L — ABNORMAL LOW (ref 3.5–5.1)
Sodium: 125 mmol/L — ABNORMAL LOW (ref 135–145)
Total Bilirubin: 1.9 mg/dL — ABNORMAL HIGH (ref 0.3–1.2)
Total Protein: 6.5 g/dL (ref 6.5–8.1)

## 2021-12-13 LAB — TYPE AND SCREEN
ABO/RH(D): A POS
Antibody Screen: NEGATIVE

## 2021-12-13 LAB — PROTIME-INR
INR: 1.2 (ref 0.8–1.2)
Prothrombin Time: 15 seconds (ref 11.4–15.2)

## 2021-12-13 LAB — CBC
HCT: 50.3 % (ref 39.0–52.0)
Hemoglobin: 17.7 g/dL — ABNORMAL HIGH (ref 13.0–17.0)
MCH: 32.1 pg (ref 26.0–34.0)
MCHC: 35.2 g/dL (ref 30.0–36.0)
MCV: 91.3 fL (ref 80.0–100.0)
Platelets: 263 10*3/uL (ref 150–400)
RBC: 5.51 MIL/uL (ref 4.22–5.81)
RDW: 13.6 % (ref 11.5–15.5)
WBC: 11.4 10*3/uL — ABNORMAL HIGH (ref 4.0–10.5)
nRBC: 0 % (ref 0.0–0.2)

## 2021-12-13 LAB — C DIFFICILE QUICK SCREEN W PCR REFLEX
C Diff antigen: NEGATIVE
C Diff interpretation: NOT DETECTED
C Diff toxin: NEGATIVE

## 2021-12-13 LAB — MAGNESIUM: Magnesium: 2 mg/dL (ref 1.7–2.4)

## 2021-12-13 LAB — LIPASE, BLOOD: Lipase: 24 U/L (ref 11–51)

## 2021-12-13 LAB — POC OCCULT BLOOD, ED: Fecal Occult Bld: POSITIVE — AB

## 2021-12-13 LAB — APTT: aPTT: 24 seconds (ref 24–36)

## 2021-12-13 LAB — LACTIC ACID, PLASMA
Lactic Acid, Venous: 2.7 mmol/L (ref 0.5–1.9)
Lactic Acid, Venous: 2.6 mmol/L (ref 0.5–1.9)

## 2021-12-13 MED ORDER — CITALOPRAM HYDROBROMIDE 20 MG PO TABS
20.0000 mg | ORAL_TABLET | Freq: Every day | ORAL | Status: DC
  Administered 2021-12-13 – 2021-12-19 (×7): 20 mg via ORAL
  Filled 2021-12-13 (×7): qty 1

## 2021-12-13 MED ORDER — ASCORBIC ACID 500 MG PO TABS
500.0000 mg | ORAL_TABLET | Freq: Every morning | ORAL | Status: DC
  Administered 2021-12-14 – 2021-12-19 (×6): 500 mg via ORAL
  Filled 2021-12-13 (×6): qty 1

## 2021-12-13 MED ORDER — IOHEXOL 9 MG/ML PO SOLN
ORAL | Status: AC
  Administered 2021-12-13: 500 mL
  Filled 2021-12-13: qty 1000

## 2021-12-13 MED ORDER — POTASSIUM CHLORIDE IN NACL 20-0.9 MEQ/L-% IV SOLN
INTRAVENOUS | Status: AC
  Administered 2021-12-13: 100 mL/h via INTRAVENOUS
  Filled 2021-12-13: qty 1000

## 2021-12-13 MED ORDER — POTASSIUM CHLORIDE IN NACL 20-0.9 MEQ/L-% IV SOLN: INTRAVENOUS | Status: DC

## 2021-12-13 MED ORDER — ONDANSETRON HCL 4 MG PO TABS: 4.0000 mg | ORAL_TABLET | Freq: Four times a day (QID) | ORAL | Status: DC | PRN

## 2021-12-13 MED ORDER — ONDANSETRON HCL 4 MG/2ML IJ SOLN: 4.0000 mg | Freq: Four times a day (QID) | INTRAMUSCULAR | Status: DC | PRN

## 2021-12-13 MED ORDER — SODIUM CHLORIDE 0.9 % IV SOLN
2.0000 g | INTRAVENOUS | Status: AC
  Administered 2021-12-14 – 2021-12-15 (×3): 2 g via INTRAVENOUS
  Filled 2021-12-13 (×3): qty 20

## 2021-12-13 MED ORDER — POTASSIUM CHLORIDE CRYS ER 20 MEQ PO TBCR
40.0000 meq | EXTENDED_RELEASE_TABLET | Freq: Once | ORAL | Status: AC
  Administered 2021-12-13: 40 meq via ORAL
  Filled 2021-12-13: qty 2

## 2021-12-13 MED ORDER — CLONAZEPAM 0.5 MG PO TABS
0.5000 mg | ORAL_TABLET | Freq: Every day | ORAL | Status: DC | PRN
  Administered 2021-12-13 – 2021-12-17 (×3): 0.5 mg via ORAL
  Filled 2021-12-13 (×3): qty 1

## 2021-12-13 MED ORDER — PANTOPRAZOLE SODIUM 40 MG PO TBEC
40.0000 mg | DELAYED_RELEASE_TABLET | Freq: Every day | ORAL | Status: DC
  Administered 2021-12-13 – 2021-12-19 (×7): 40 mg via ORAL
  Filled 2021-12-13 (×7): qty 1

## 2021-12-13 MED ORDER — MAGNESIUM SULFATE IN D5W 1-5 GM/100ML-% IV SOLN
1.0000 g | Freq: Once | INTRAVENOUS | Status: AC
  Administered 2021-12-13: 1 g via INTRAVENOUS
  Filled 2021-12-13: qty 100

## 2021-12-13 MED ORDER — BOOST / RESOURCE BREEZE PO LIQD CUSTOM
1.0000 | Freq: Three times a day (TID) | ORAL | Status: DC
  Administered 2021-12-14 (×2): 1 via ORAL

## 2021-12-13 MED ORDER — NITROGLYCERIN 0.4 MG SL SUBL: 0.4000 mg | SUBLINGUAL_TABLET | SUBLINGUAL | Status: DC | PRN

## 2021-12-13 MED ORDER — SODIUM CHLORIDE 0.9 % IV BOLUS: 1000.0000 mL | Freq: Once | INTRAVENOUS | Status: DC

## 2021-12-13 MED ORDER — METRONIDAZOLE 500 MG/100ML IV SOLN
500.0000 mg | Freq: Two times a day (BID) | INTRAVENOUS | Status: DC
Start: 2021-12-13 — End: 2021-12-15
  Administered 2021-12-14 – 2021-12-15 (×4): 500 mg via INTRAVENOUS
  Filled 2021-12-13 (×4): qty 100

## 2021-12-13 MED ORDER — HYDROMORPHONE HCL 2 MG PO TABS
2.0000 mg | ORAL_TABLET | ORAL | Status: DC | PRN
  Administered 2021-12-14 – 2021-12-19 (×16): 2 mg via ORAL
  Filled 2021-12-13 (×15): qty 1

## 2021-12-13 MED ORDER — TOPIRAMATE 25 MG PO TABS
50.0000 mg | ORAL_TABLET | Freq: Every day | ORAL | Status: DC
  Administered 2021-12-13 – 2021-12-18 (×6): 50 mg via ORAL
  Filled 2021-12-13 (×6): qty 2

## 2021-12-13 MED ORDER — SODIUM CHLORIDE 0.9 % IV BOLUS
500.0000 mL | Freq: Once | INTRAVENOUS | Status: AC
  Administered 2021-12-13: 500 mL via INTRAVENOUS

## 2021-12-13 MED ORDER — ACETAMINOPHEN 325 MG PO TABS: 650.0000 mg | ORAL_TABLET | Freq: Four times a day (QID) | ORAL | Status: DC | PRN

## 2021-12-13 MED ORDER — POTASSIUM CHLORIDE 10 MEQ/100ML IV SOLN
10.0000 meq | INTRAVENOUS | Status: AC
  Administered 2021-12-13 (×2): 10 meq via INTRAVENOUS
  Filled 2021-12-13 (×2): qty 100

## 2021-12-13 MED ORDER — B COMPLEX-C PO TABS
1.0000 | ORAL_TABLET | Freq: Every day | ORAL | Status: DC
Start: 2021-12-13 — End: 2021-12-19
  Administered 2021-12-13 – 2021-12-19 (×7): 1 via ORAL
  Filled 2021-12-13 (×7): qty 1

## 2021-12-13 MED ORDER — VITAMIN D 25 MCG (1000 UNIT) PO TABS
2000.0000 [IU] | ORAL_TABLET | Freq: Every day | ORAL | Status: DC
Start: 2021-12-13 — End: 2021-12-19
  Administered 2021-12-13 – 2021-12-19 (×7): 2000 [IU] via ORAL
  Filled 2021-12-13 (×7): qty 2

## 2021-12-13 MED ORDER — ONDANSETRON HCL 4 MG/2ML IJ SOLN
4.0000 mg | Freq: Once | INTRAMUSCULAR | Status: AC
  Administered 2021-12-13: 4 mg via INTRAVENOUS
  Filled 2021-12-13: qty 2

## 2021-12-13 MED ORDER — ADULT MULTIVITAMIN W/MINERALS CH
1.0000 | ORAL_TABLET | Freq: Every evening | ORAL | Status: DC
  Administered 2021-12-13 – 2021-12-18 (×6): 1 via ORAL
  Filled 2021-12-13 (×6): qty 1

## 2021-12-13 MED ORDER — ACETAMINOPHEN 650 MG RE SUPP: 650.0000 mg | Freq: Four times a day (QID) | RECTAL | Status: DC | PRN

## 2021-12-13 MED ORDER — METOPROLOL SUCCINATE ER 50 MG PO TB24
50.0000 mg | ORAL_TABLET | Freq: Every day | ORAL | Status: DC
  Administered 2021-12-14 – 2021-12-19 (×6): 50 mg via ORAL
  Filled 2021-12-13 (×6): qty 1

## 2021-12-13 NOTE — ED Triage Notes (Signed)
Pt presents with abdominal pain and loose stools for approx 1 week, seen at [facility] diagnosed with virus, pt has decreased intake and dark stools, PMH of hemorrhoids.

## 2021-12-13 NOTE — ED Provider Notes (Signed)
[facility] EMERGENCY DEPARTMENT Provider Note   CSN: [id number] Arrival date & time: 12/13/21  1343     History  Chief Complaint  Patient presents with   Abdominal Pain    [name] is a 71 y.o. male.  71 year old male with past medical history of MI with stents, hypertension, CHF (although denies history when asked- EF 55-65% on cath from 10/15/17), Sjogren's disease and kidney stones presents with complaint of lower abdominal pain with diarrhea.  Patient states that his symptoms started last Wednesday with vomiting and diarrhea, fevers, chills (states his normal temperature is in the 97's, temp was 98).  He fell like his symptoms improved the following day however continued to have lower abdominal pain and diarrhea.  Granddaughter at bedside notes that patient has had red stools initially although has history of hemorrhoids, states that his stool is still somewhat bloody in appearance although not dark or coffee-ground.  Patient denies recent antibiotic use, travel, sick contacts.  No prior abdominal surgery.  Patient went to [facility] ER 12/06/21, had an abdominal x-ray and blood work.  Patient was discharged with a low blood pressure of 95 systolic, told his labs and x-rays were fine and to follow-up if symptoms continue.  Patient is not been able to keep anything down including his regular meds and blood pressure medications.  Patient reports feeling weak and dizzy when he stands to ambulate to the bathroom, no falls.       Home Medications Prior to Admission medications   Medication Sig Start Date End Date Taking? Authorizing Provider  aspirin EC 81 MG tablet Take 81 mg by mouth at bedtime.    Yes [provider]  atorvastatin (LIPITOR) 40 MG tablet TAKE 1 TABLET IN THE EVENING 11/11/21  Yes Strader, [place] M, PA-C  b complex vitamins tablet Take 1 tablet by mouth daily.   Yes [provider]  betamethasone valerate ointment (VALISONE) 0.1 % Apply 1  application topically 2 (two) times daily as needed (itching).   Yes [provider]  Butalbital-APAP-Caffeine [id number] MG capsule Take 1-2 capsules by mouth as needed for headache. 08/07/17  Yes [provider]  Cholecalciferol (VITAMIN D) 2000 units tablet Take 2,000 Units by mouth daily.   Yes [provider]  citalopram (CELEXA) 20 MG tablet Take 20 mg by mouth daily.  01/18/15  Yes [provider]  clonazePAM (KLONOPIN) 0.5 MG tablet Take 0.5 mg by mouth See admin instructions. Take 1 or 2 tablets as needed for panick attacks   Yes [provider]  COENZYME Q10 PO Take by mouth.   Yes [provider]  diphenhydrAMINE (BENADRYL) 25 MG tablet Take 25-50 mg by mouth 2 (two) times daily as needed for itching.    Yes [provider]  fluticasone (CUTIVATE) 0.05 % cream Apply 1 application topically 2 (two) times daily as needed (rash). Applied to face and/or penis area(s) 02/14/15  Yes [provider]  furosemide (LASIX) 40 MG tablet Take 80 mg by mouth daily.   Yes [provider]  guaifenesin (HUMIBID E) 400 MG TABS tablet Take 800 mg by mouth daily as needed (for post nasal drip/cough).    Yes [provider]  HYDROmorphone (DILAUDID) 2 MG tablet Take 2 mg by mouth every 3 (three) hours as needed for severe pain. 04/23/20  Yes [provider]  ibuprofen (ADVIL,MOTRIN) 200 MG tablet Take 600 mg by mouth 4 (four) times daily as needed for mild pain or moderate  pain.    Yes [provider]  isosorbide mononitrate (IMDUR) 120 MG 24 hr tablet TAKE 1 TABLET EVERY DAY Patient taking differently: 120 mg. Take 120 mg in the AM and 30 mg in the PM 01/23/21  Yes Branch, Alphonse Guild, MD  L-Lysine 500 MG TABS Take 500 mg by mouth daily.   Yes [provider]  loperamide (IMODIUM A-D) 2 MG tablet Take 6 mg by mouth 4 (four) times daily as needed for diarrhea or loose stools.   Yes [provider]  metoprolol succinate (TOPROL-XL) 50 MG 24 hr tablet Take 1 tablet (50 mg total) by mouth daily. Take with or immediately following a meal. 12/01/18 12/13/21 Yes Herminio Commons, MD  Multiple Vitamins-Minerals (MULTIVITAMIN WITH MINERALS) tablet Take 1 tablet by mouth every evening.    Yes [provider]  naproxen sodium (ALEVE) 220 MG tablet Take 220 mg by mouth.   Yes [provider]  nitroGLYCERIN (NITROSTAT) 0.4 MG SL tablet Take one every 5 minutes as needed for chest pain 10/10/21  Yes Strader, [place] M, PA-C  NON FORMULARY at bedtime. CPAP   Yes [provider]  omeprazole (PRILOSEC) 40 MG capsule Take 40 mg by mouth daily before breakfast.   Yes [provider]  testosterone cypionate (DEPOTESTOTERONE CYPIONATE) 100 MG/ML injection Inject 100 mg into the muscle every 7 (seven) days.   Yes [provider]  topiramate (TOPAMAX) 25 MG tablet Take 50 mg by mouth at bedtime.   Yes [provider]  Turmeric (QC TUMERIC COMPLEX PO) Take by mouth.   Yes [provider]  vitamin C (ASCORBIC ACID) 500 MG tablet Take 500 mg by mouth every morning.   Yes [provider]  polyvinyl alcohol-povidone (HYPOTEARS) 1.4-0.6 % ophthalmic solution Place 1-2 drops into both eyes daily as needed (dry eyes). For dry eyes    [provider]      Allergies    Bee venom, Duloxetine, Fentanyl, Lidocaine, Meperidine hcl, Methadone, Oxycodone hcl, Pregabalin, Zolpidem, and Zolpidem tartrate    Review of Systems   Review of Systems Negative except as per HPI Physical Exam Updated Vital Signs BP (!) 138/94   Pulse 95   Temp 98.4 F (36.9 C)   Resp 13   Ht '5\' 8"'$  (1.727 m)   Wt 97.5 kg   SpO2 100%   BMI 32.69 kg/m  Physical Exam Vitals and nursing note reviewed.  Constitutional:      General: He is not in acute distress.    Appearance: He is well-developed. He is not diaphoretic.     Comments: Appears to feel unwell   HENT:     Head: Normocephalic and atraumatic.  Cardiovascular:     Rate and Rhythm: Normal rate and regular rhythm.     Heart sounds: Normal heart sounds.  Pulmonary:     Effort: Pulmonary effort is normal.     Breath sounds: Normal breath sounds.  Abdominal:     General: Bowel sounds are decreased.     Tenderness: There is generalized abdominal tenderness. There is no right CVA tenderness or left CVA tenderness.  Genitourinary:    Rectum: Guaiac result positive.     Comments: Stool is green with mucous, hemoccult + Musculoskeletal:     Right lower leg: No edema.     Left lower leg: No edema.  Skin:    General: Skin is warm and dry.  Neurological:     Mental Status: He is alert  and oriented to person, place, and time.  Psychiatric:        Behavior: Behavior normal.     ED Results / Procedures / Treatments   Labs (all labs ordered are listed, but only abnormal results are displayed) Labs Reviewed  COMPREHENSIVE METABOLIC PANEL - Abnormal; Notable for the following components:      Result Value   Sodium 125 (*)    Potassium 3.1 (*)    Chloride 91 (*)    CO2 20 (*)    Glucose, Bld 156 (*)    BUN 32 (*)    Creatinine, Ser 2.26 (*)    Calcium 8.3 (*)    Albumin 2.5 (*)    Total Bilirubin 1.9 (*)    GFR, Estimated 30 (*)    All other components within normal limits  CBC - Abnormal; Notable for the following components:   WBC 11.4 (*)    Hemoglobin 17.7 (*)    All other components within normal limits  LACTIC ACID, PLASMA - Abnormal; Notable for the following components:   Lactic Acid, Venous 2.7 (*)    All other components within normal limits  POC OCCULT BLOOD, ED - Abnormal; Notable for the following components:   Fecal Occult Bld POSITIVE (*)    All other components within normal limits  C DIFFICILE QUICK SCREEN W PCR REFLEX    CULTURE, BLOOD (ROUTINE X 2)  CULTURE, BLOOD (ROUTINE X 2)  GASTROINTESTINAL PANEL BY PCR, STOOL (REPLACES STOOL CULTURE)  URINE  CULTURE  LIPASE, BLOOD  PROTIME-INR  APTT  MAGNESIUM  URINALYSIS, ROUTINE W REFLEX MICROSCOPIC  LACTIC ACID, PLASMA  HIV ANTIBODY (ROUTINE TESTING W REFLEX)  BASIC METABOLIC PANEL  CBC  TYPE AND SCREEN    EKG None  Radiology DG Chest Port 1 View  Result Date: 12/13/2021 CLINICAL DATA:  Abdominal pain, loose stools for 1 week EXAM: PORTABLE CHEST 1 VIEW COMPARISON:  12/06/2021 FINDINGS: Single frontal view of the chest demonstrates an unremarkable cardiac silhouette. No acute airspace disease, effusion, or pneumothorax. No acute bony abnormality. IMPRESSION: 1. No acute intrathoracic process. Electronically Signed   By: Randa Ngo M.D.   On: 12/13/2021 17:59   CT Abdomen Pelvis Wo Contrast  Result Date: 12/13/2021 CLINICAL DATA:  Abdominal pain, acute, nonlocalized EXAM: CT ABDOMEN AND PELVIS WITHOUT CONTRAST TECHNIQUE: Multidetector CT imaging of the abdomen and pelvis was performed following the standard protocol without IV contrast. RADIATION DOSE REDUCTION: This exam was performed according to the departmental dose-optimization program which includes automated exposure control, adjustment of the mA and/or kV according to patient size and/or use of iterative reconstruction technique. COMPARISON:  None Available. FINDINGS: Lower chest: No acute findings Hepatobiliary: No focal hepatic abnormality. Gallbladder unremarkable. Pancreas: No focal abnormality or ductal dilatation. Spleen: No focal abnormality.  Normal size. Adrenals/Urinary Tract: No adrenal abnormality. No focal renal abnormality. No stones or hydronephrosis. Urinary bladder is unremarkable. Stomach/Bowel: There is diffuse colonic wall thickening and stranding around the colon compatible with pancolitis. Stomach and small bowel decompressed, unremarkable. No bowel obstruction. Vascular/Lymphatic: Aortic atherosclerosis. No evidence of aneurysm or adenopathy. Reproductive: No visible focal abnormality. Other: No free fluid or  free air. Musculoskeletal: No acute bony abnormality. IMPRESSION: Diffuse colonic wall thickening and surrounding stranding compatible with pancolitis. Electronically Signed   By: Rolm Baptise M.D.   On: 12/13/2021 17:21    Procedures .Critical Care  Performed by: Tacy Learn, PA-C Authorized by: Tacy Learn, PA-C   Critical care provider statement:  Critical care time (minutes):  30   Critical care was time spent personally by me on the following activities:  Development of treatment plan with patient or surrogate, discussions with consultants, evaluation of patient's response to treatment, examination of patient, ordering and review of laboratory studies, ordering and review of radiographic studies, ordering and performing treatments and interventions, pulse oximetry, re-evaluation of patient's condition and review of old charts     Medications Ordered in ED Medications  0.9 % NaCl with KCl 20 mEq/ L  infusion (100 mL/hr Intravenous New Bag/Given 12/13/21 1843)  HYDROmorphone (DILAUDID) tablet 2 mg (has no administration in time range)  metoprolol succinate (TOPROL-XL) 24 hr tablet 50 mg (has no administration in time range)  nitroGLYCERIN (NITROSTAT) SL tablet 0.4 mg (has no administration in time range)  citalopram (CELEXA) tablet 20 mg (has no administration in time range)  pantoprazole (PROTONIX) EC tablet 40 mg (has no administration in time range)  clonazePAM (KLONOPIN) tablet 0.5 mg (0.5 mg Oral Given 12/13/21 2043)  topiramate (TOPAMAX) tablet 50 mg (has no administration in time range)  B-complex with vitamin C tablet 1 tablet (has no administration in time range)  cholecalciferol (VITAMIN D3) 25 MCG (1000 UNIT) tablet 2,000 Units (has no administration in time range)  multivitamin with minerals tablet 1 tablet (has no administration in time range)  ascorbic acid (VITAMIN C) tablet 500 mg (has no administration in time range)  0.9 % NaCl with KCl 20 mEq/ L  infusion (has  no administration in time range)  acetaminophen (TYLENOL) tablet 650 mg (has no administration in time range)    Or  acetaminophen (TYLENOL) suppository 650 mg (has no administration in time range)  ondansetron (ZOFRAN) tablet 4 mg (has no administration in time range)    Or  ondansetron (ZOFRAN) injection 4 mg (has no administration in time range)  potassium chloride 10 mEq in 100 mL IVPB ( Intravenous Rate/Dose Change 12/13/21 1944)  cefTRIAXone (ROCEPHIN) 2 g in sodium chloride 0.9 % 100 mL IVPB (has no administration in time range)  metroNIDAZOLE (FLAGYL) IVPB 500 mg (has no administration in time range)  feeding supplement (BOOST / RESOURCE BREEZE) liquid 1 Container (has no administration in time range)  sodium chloride 0.9 % bolus 500 mL (0 mLs Intravenous Stopped 12/13/21 1545)  potassium chloride SA (KLOR-CON M) CR tablet 40 mEq (40 mEq Oral Given 12/13/21 1515)  ondansetron (ZOFRAN) injection 4 mg (4 mg Intravenous Given 12/13/21 1512)  iohexol (OMNIPAQUE) 9 MG/ML oral solution (500 mLs  Contrast Given 12/13/21 1530)  magnesium sulfate IVPB 1 g 100 mL (0 g Intravenous Stopped 12/13/21 1941)    ED Course/ Medical Decision Making/ A&P                           Medical Decision Making Amount and/or Complexity of Data Reviewed Labs: ordered. Radiology: ordered.  Risk Prescription drug management. Decision regarding hospitalization.   This patient presents to the ED for concern of abdominal pain, diarrhea, feeling weak, this involves an extensive number of treatment options, and is a complaint that carries with it a high risk of complications and morbidity.  The differential diagnosis includes but not limited to C. difficile, colitis, diverticulitis, perforation, dehydration, metabolic disturbance, sepsis, GI bleed   Co morbidities that complicate the patient evaluation  CAD with stents, CHF (acute diastolic heart failure with EF of 50%), dyslipidemia   Additional history  obtained:  Additional history obtained from granddaughter  at bedside who states is an EMT and assessed patient's blood pressure at home, was concern for low reading. External records from outside source obtained and reviewed including records from [place] from recent ER visit to [facility], Cr 1.83, BUN 40, GFR 39, Na 128 C. difficile negative   Lab Tests:  I Ordered, and personally interpreted labs.  The pertinent results include: CBC with mild leukocytosis white count 11.4.  CMP with AKI with creatinine of 2.26, GFR is 30 today.  Sodium hyponatremic at 125, hypokalemic with potassium of 3.1.  Lipase is normal at 24.  Hemoccult is positive.   Imaging Studies ordered:  I ordered imaging studies including CT abdomen pelvis without contrast secondary to AKI I independently visualized and interpreted imaging which showed diffuse colonic wall thickening and surrounding stranding compatible with pancolitis.  No free air. I agree with the radiologist interpretation   Cardiac Monitoring: / EKG:  The patient was maintained on a cardiac monitor.  I personally viewed and interpreted the cardiac monitored which showed an underlying rhythm of: Sinus arrhythmia, rate 112.  EKG reviewed with Dr. Sabra Heck, read as A-fib however disagree with reading as patient does have P waves   Consultations Obtained:  I requested consultation with the hospitalist, Dr. Waldron [facility],  and discussed lab and imaging findings as well as pertinent plan - they recommend: Admission   Problem List / ED Course / Critical interventions / Medication management  71 year old male presents from home with concern for feeling weak and dizzy after having had diarrhea for the past week and a half with abdominal cramping.  Patient does not have recent labs on file with exception of labs obtained within the past few days at [facility] which were reviewed today.  Patient does have uptrend in his creatinine, concern for AKI.  He has  hyponatremia with a sodium of 125 and hypokalemia with a potassium of 3.1.  He was given oral potassium replacement.  Gentle IV fluid bolus due to history of CHF.  He was hypotensive on arrival, this is improved with fluids.  His initial O2 reading of 78% appears inaccurate on subsequent readings on room air as he is satting near 100%.  He is occasionally tachycardic, his EKG is reviewed, felt to be sinus arrhythmia without evidence of a-fib (reviewed with ER attending).  Is Hemoccult positive, appears green with mucus, suspect the positive test is secondary to his colitis, he has a stable H&H.  CT suggesting pancolitis, is negative for C. difficile with additional stool studies pending.  Plan is to admit to hospital service for further observation and treatment. I ordered medication including IV fluids (bolus limited due to history of CHF), K-Dur, Zofran for dehydration, hypokalemia, nausea Reevaluation of the patient after these medicines showed that the patient improved (BP improved)  I have reviewed the patients home medicines and have made adjustments as needed   Social Determinants of Health:  Lives alone, has family who checks on him   Test / Admission - Considered:  Plan is to admit for gentle hydration and further evaluation         Final Clinical Impression(s) / ED Diagnoses Final diagnoses:  Diarrhea, unspecified type  Hypokalemia  Hyponatremia  AKI (acute kidney injury) ([place])  Pancolitis ([place])    Rx / DC Orders ED Discharge Orders     None         Roque Lias 12/13/21 2105    Noemi Chapel, MD 12/14/21 [phone]

## 2021-12-13 NOTE — ED Notes (Signed)
To CT

## 2021-12-13 NOTE — ED Provider Triage Note (Signed)
Emergency Medicine Provider Triage Evaluation Note  [name] , a 71 y.o. male  was evaluated in triage.  Pt complains of abdominal pain and generalized weakness. Accompanied by granddaughter who works in EMS who provides  much of the history. Patient seen at [facility] on 12/06/2021 for diarrhea and abdominal pain and was discharged home after Ct abdomen showed possible respiratory infection and nonobstructed gas patter consistent with enteritis. Grandddaughter is concerned that his blood pressures have been very soft and he continues to have malodorous diarrhea that is darkly colored. Pt denies fevers, shortness of breath, dysuria or hematuris.  Review of Systems  Positive: As above Negative:   Physical Exam  BP 102/62 (BP Location: Right Arm)   Pulse (!) 135   Temp (!) 97.5 F (36.4 C)   Resp 17   Ht '5\' 8"'$  (1.727 m)   Wt 97.5 kg   SpO2 (!) 78%   BMI 32.69 kg/m  Gen:   Awake, no distress, ill appearing   Resp:  Normal effort  MSK:   Moves extremities without difficulty  Other:  Rechecked vitals given concerning documentation of tachycardia and spo2 of 78% - noted to have pulse of 101 and spo2 of 97.   Medical Decision Making  Medically screening exam initiated at 2:05 PM.  Appropriate orders placed.  XAN INGRAHAM was informed that the remainder of the evaluation will be completed by another provider, this initial triage assessment does not replace that evaluation, and the importance of remaining in the ED until their evaluation is complete.     Tonye Pearson, [place] 12/13/21 1413

## 2021-12-13 NOTE — ED Notes (Signed)
Date and time results received: 12/13/21 1932   Test: LACTIC Critical Value: 2.7  Name of Provider Notified: Izora Gala, MD

## 2021-12-13 NOTE — ED Notes (Signed)
Pt had urine sent to lab, lab stated it wasn't enough. Lab collected the little bit of urine but insisted on more before running urine culture and urinalysis.

## 2021-12-13 NOTE — ED Notes (Addendum)
Bedside commode placed in room. SPO2 difficult to read on finger, SPO2 probe placed on right earlobe, SPO2 100%

## 2021-12-13 NOTE — H&P (Signed)
TRH H&P   Patient Demographics:    [name], is a 71 y.o. male  MRN: [id number]   DOB - 04/28/1951  Admit Date - 12/13/2021  Outpatient Primary MD for the patient is Rosalee Kaufman, PA-C  Referring MD/NP/PA: Leone Haven  Patient coming from: Home  Chief Complaint  Patient presents with   Abdominal Pain      HPI:    [name]  is a 71 y.o. male, with past medical history of CAD, status post PCI, hypertension, chronic diastolic CHF, Sjogren's disease, kidney stones, patient presents to ED secondary to complaints of abdominal pain and diarrhea, patient reports symptoms has been going on for last 10 days, started last Wednesday, started to have nausea, vomiting, diarrhea, fever and chills, initial improvement, but then symptoms has worsened, with worsening abdominal pain and diarrhea, as well he does report red stools with his diarrhea, no coffee-ground emesis, will intake and appetite, and had [facility] ED visit 7/28, where he was discharged with low blood pressure of 95 systolic, x-ray with no acute findings, patient had colonoscopy in 2022, due for work-up of screening test concerning for malignancy, but had poor bowel prep. - in ED patient was noted to have sodium of 125, potassium of 3.1, chloride of 91, creatinine of 2.26, white blood cell count of 11.4, hemoglobin 17.7, CT abdomen pelvis without contrast was significant for pancolitis, he was Hemoccult positive, [facility] consulted to admit.    Review of systems:      A full 10 point Review of Systems was done, except as stated above, all other Review of Systems were negative.   With Past History of the following :    Past Medical History:  Diagnosis Date   Anxiety    HX PANIC ATTACKS   CHF (congestive heart failure) (HCC)    Chronic cough    Coronary artery disease    a. s/p DES to LAD and  PTCA of D1 in 2010 b. cath in 2016 showing patent stent with 50% stenosis of jailed D1 c. cath in 10/2017 showing patent LAD stent with 70% D1 stenosis and 80% RV branch stenosis and medical management recommended   Dry eyes    Fibromyalgia    Fluid retention    GERD (gastroesophageal reflux disease)    Headache(784.0)    Herpes genitalis in men    History of kidney stones    History of vertigo    Hypertension    Narcolepsy    Neuropathy    Penile lesion    Sjogren's disease ([place])    Sleep apnea    USES C-PAP      Past Surgical History:  Procedure Laterality Date   BREAST SURGERY     BIL MASTECTOMY  FOR BENIGN LUMPS   CARDIAC CATHETERIZATION N/A 01/09/2015   Procedure: Left Heart Cath and Coronary Angiography;  Surgeon: Leonie Green  Ellyn Hack, MD;  Location: [facility];  Service: Cardiovascular;  Laterality: N/A;   CARPAL TUNNELL  BIL   CIRCUMCISION     CORONARY ANGIOPLASTY WITH STENT PLACEMENT  X1 2010   HERNIA REPAIR     ING HERNIA REPAIR   LEFT HEART CATH AND CORONARY ANGIOGRAPHY N/A 10/15/2017   Procedure: LEFT HEART CATH AND CORONARY ANGIOGRAPHY;  Surgeon: Martinique, Peter M, MD;  Location: [facility];  Service: Cardiovascular;  Laterality: N/A;   LEFT HEART CATHETERIZATION WITH CORONARY ANGIOGRAM N/A 10/11/2013   Procedure: LEFT HEART CATHETERIZATION WITH CORONARY ANGIOGRAM;  Surgeon: Sinclair Grooms, MD;  Location: [facility] CATH LAB;  Service: Cardiovascular;  Laterality: N/A;   LEFT HEART CATHETERIZATION WITH CORONARY ANGIOGRAM N/A 06/21/2014   Procedure: LEFT HEART CATHETERIZATION WITH CORONARY ANGIOGRAM;  Surgeon: Troy Sine, MD;  Location: [facility] CATH LAB;  Service: Cardiovascular;  Laterality: N/A;   MEATOTOMY  X2   PERCUTANEOUS CORONARY STENT INTERVENTION (PCI-S) N/A 06/23/2014   Procedure: PERCUTANEOUS CORONARY STENT INTERVENTION (PCI-S);  Surgeon: Jettie Booze, MD;  Location: [facility] CATH LAB;  Service: Cardiovascular;  Laterality: N/A;   VASECTOMY     WRIST  FRACTURE SURGERY  L  X3 SURGERIES      Social History:     Social History   Tobacco Use   Smoking status: Former    Packs/day: 2.50    Years: 32.00    Total pack years: 80.00    Types: Cigarettes    Start date: 04/17/1967    Quit date: 05/12/2000    Years since quitting: 21.6   Smokeless tobacco: Never  Substance Use Topics   Alcohol use: No    Alcohol/week: 0.0 standard drinks of alcohol    Comment: 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 Authorizing Provider  aspirin EC 81 MG tablet Take 81 mg by mouth at bedtime.    Yes [provider]  atorvastatin (LIPITOR) 40 MG tablet TAKE 1 TABLET IN THE EVENING 11/11/21  Yes Strader, [place] M, PA-C  b complex vitamins tablet Take 1 tablet by mouth daily.   Yes [provider]  betamethasone valerate ointment (VALISONE) 0.1 % Apply 1 application topically 2 (two) times daily as needed (itching).   Yes [provider]  Butalbital-APAP-Caffeine [phone] MG capsule Take 1-2 capsules by mouth as needed for headache. 08/07/17  Yes [provider]  Cholecalciferol (VITAMIN D) 2000 units tablet Take 2,000 Units by mouth daily.   Yes [provider]  citalopram (CELEXA) 20 MG tablet Take 20 mg by mouth daily.  01/18/15  Yes [provider]  clonazePAM (KLONOPIN) 0.5 MG tablet Take 0.5 mg by mouth See admin instructions. Take 1 or 2 tablets as needed for panick attacks   Yes [provider]  COENZYME Q10 PO Take by mouth.   Yes [provider]  diphenhydrAMINE (BENADRYL) 25 MG tablet Take 25-50 mg by mouth 2 (two) times daily as needed for itching.    Yes [provider]  fluticasone (CUTIVATE) 0.05 % cream Apply 1 application topically 2 (two) times daily as needed (rash).  Applied to face and/or penis area(s) 02/14/15  Yes [provider]  furosemide (LASIX) 40 MG tablet Take 80  mg by mouth daily.   Yes [provider]  guaifenesin (HUMIBID E) 400 MG TABS tablet Take 800 mg by mouth daily as needed (for post nasal drip/cough).    Yes [provider]  HYDROmorphone (DILAUDID) 2 MG tablet Take 2 mg by mouth every 3 (three) hours as needed for severe pain. 04/23/20  Yes [provider]  ibuprofen (ADVIL,MOTRIN) 200 MG tablet Take 600 mg by mouth 4 (four) times daily as needed for mild pain or moderate pain.    Yes [provider]  isosorbide mononitrate (IMDUR) 120 MG 24 hr tablet TAKE 1 TABLET EVERY DAY Patient taking differently: 120 mg. Take 120 mg in the AM and 30 mg in the PM 01/23/21  Yes Branch, Alphonse Guild, MD  L-Lysine 500 MG TABS Take 500 mg by mouth daily.   Yes [provider]  loperamide (IMODIUM A-D) 2 MG tablet Take 6 mg by mouth 4 (four) times daily as needed for diarrhea or loose stools.   Yes [provider]  metoprolol succinate (TOPROL-XL) 50 MG 24 hr tablet Take 1 tablet (50 mg total) by mouth daily. Take with or immediately following a meal. 12/01/18 12/13/21 Yes Herminio Commons, MD  Multiple Vitamins-Minerals (MULTIVITAMIN WITH MINERALS) tablet Take 1 tablet by mouth every evening.    Yes [provider]  naproxen sodium (ALEVE) 220 MG tablet Take 220 mg by mouth.   Yes [provider]  nitroGLYCERIN (NITROSTAT) 0.4 MG SL tablet Take one every 5 minutes as needed for chest pain 10/10/21  Yes Strader, [place] M, PA-C  NON FORMULARY at bedtime. CPAP   Yes [provider]  omeprazole (PRILOSEC) 40 MG capsule Take 40 mg by mouth daily before breakfast.   Yes [provider]  testosterone cypionate (DEPOTESTOTERONE CYPIONATE) 100 MG/ML injection Inject 100 mg into the muscle every 7 (seven) days.   Yes [provider]  topiramate (TOPAMAX) 25 MG  tablet Take 50 mg by mouth at bedtime.   Yes [provider]  Turmeric (QC TUMERIC COMPLEX PO) Take by mouth.   Yes [provider]  vitamin C (ASCORBIC ACID) 500 MG tablet Take 500 mg by mouth every morning.   Yes [provider]  polyvinyl alcohol-povidone (HYPOTEARS) 1.4-0.6 % ophthalmic solution Place 1-2 drops into both eyes daily as needed (dry eyes). For dry eyes    [provider]     Allergies:     Allergies  Allergen Reactions   Bee Venom Anaphylaxis   Duloxetine Swelling    CHF   Fentanyl Other (See Comments)    Confusion, panic attacks with the patch,  Fentanyl injected is ok   Lidocaine Other (See Comments)    REACTION: non-reactive   Meperidine Hcl Other (See Comments)    REACTION: causes extreme mental reactions.   Methadone Other (See Comments)    REACTION: causes heart failure   Oxycodone Hcl Other (See Comments)    REACTION: causes heart failure   Pregabalin Other (See Comments)    REACTION: chf   Zolpidem Other (See Comments)    REACTION: confusion REACTION: confusion REACTION: confusion   Zolpidem Tartrate Other (See Comments)    REACTION: confusion     Physical Exam:   Vitals  Blood pressure 118/62, pulse 87, temperature (!) 97.3 F (36.3 C), temperature source Oral, resp. rate 20, height '5\' 8"'$  (1.727 m), weight 97.5 kg, SpO2 100 %.   1. General frail elderly male, laying in bed in mild discomfort.  2. Normal affect and insight, Not Suicidal or Homicidal, Awake Alert, Oriented X 3.  3. No F.N deficits, ALL C.Nerves Intact, Strength 5/5 all 4 extremities, Sensation intact all 4 extremities, Plantars down going.  4. Ears and Eyes appear Normal, Conjunctivae clear, PERRLA. Dry  Oral Mucosa.  5. Supple Neck, No JVD, No cervical lymphadenopathy appriciated, No Carotid Bruits.  6. Symmetrical Chest wall movement, Good air movement bilaterally, CTAB.  7. RRR, No Gallops, Rubs or Murmurs, No Parasternal  Heave.  8. Positive Bowel Sounds, Abdomen Soft, diffuse mild  tenderness, No organomegaly appriciated,No rebound -guarding or rigidity.  9.  No Cyanosis, delayed skin turgor, significant bruising from fall  10. Good muscle tone,  joints appear normal , no effusions, Normal ROM.  11. No Palpable Lymph Nodes in Neck or Axillae    Data Review:    CBC Recent Labs  Lab 12/13/21 1413  WBC 11.4*  HGB 17.7*  HCT 50.3  PLT 263  MCV 91.3  MCH 32.1  MCHC 35.2  RDW 13.6   ------------------------------------------------------------------------------------------------------------------  Chemistries  Recent Labs  Lab 12/13/21 1413  NA 125*  K 3.1*  CL 91*  CO2 20*  GLUCOSE 156*  BUN 32*  CREATININE 2.26*  CALCIUM 8.3*  AST 24  ALT 22  ALKPHOS 62  BILITOT 1.9*   ------------------------------------------------------------------------------------------------------------------ estimated creatinine clearance is 34.4 mL/min (A) (by C-G formula based on SCr of 2.26 mg/dL (H)). ------------------------------------------------------------------------------------------------------------------ No results for input(s): "TSH", "T4TOTAL", "T3FREE", "THYROIDAB" in the last 72 hours.  Invalid input(s): "FREET3"  Coagulation profile No results for input(s): "INR", "PROTIME" in the last 168 hours. ------------------------------------------------------------------------------------------------------------------- No results for input(s): "DDIMER" in the last 72 hours. -------------------------------------------------------------------------------------------------------------------  Cardiac Enzymes No results for input(s): "CKMB", "TROPONINI", "MYOGLOBIN" in the last 168 hours.  Invalid input(s): "CK" ------------------------------------------------------------------------------------------------------------------ No results found for:  "BNP"   ---------------------------------------------------------------------------------------------------------------  Urinalysis    Component Value Date/Time   COLORURINE YELLOW 07/30/2011 2052   APPEARANCEUR CLEAR 07/30/2011 2052   LABSPEC 1.012 07/30/2011 2052   PHURINE 6.5 07/30/2011 2052   GLUCOSEU NEGATIVE 07/30/2011 2052   HGBUR NEGATIVE 07/30/2011 2052   BILIRUBINUR NEGATIVE 07/30/2011 2052   KETONESUR NEGATIVE 07/30/2011 2052   PROTEINUR NEGATIVE 07/30/2011 2052   UROBILINOGEN 1.0 07/30/2011 2052   NITRITE NEGATIVE 07/30/2011 2052   LEUKOCYTESUR NEGATIVE 07/30/2011 2052    ----------------------------------------------------------------------------------------------------------------   Imaging Results:    DG Chest Port 1 View  Result Date: 12/13/2021 CLINICAL DATA:  Abdominal pain, loose stools for 1 week EXAM: PORTABLE CHEST 1 VIEW COMPARISON:  12/06/2021 FINDINGS: Single frontal view of the chest demonstrates an unremarkable cardiac silhouette. No acute airspace disease, effusion, or pneumothorax. No acute bony abnormality. IMPRESSION: 1. No acute intrathoracic process. Electronically Signed   By: Randa Ngo M.D.   On: 12/13/2021 17:59   CT Abdomen Pelvis Wo Contrast  Result Date: 12/13/2021 CLINICAL DATA:  Abdominal pain, acute, nonlocalized EXAM: CT ABDOMEN AND PELVIS WITHOUT CONTRAST TECHNIQUE: Multidetector CT imaging of the abdomen and pelvis was performed following the standard protocol without IV contrast. RADIATION DOSE REDUCTION: This exam was performed according to the departmental dose-optimization program which includes automated exposure control, adjustment of the mA and/or kV according to patient size and/or use of iterative reconstruction technique. COMPARISON:  None Available. FINDINGS: Lower chest: No acute findings Hepatobiliary: No focal hepatic abnormality. Gallbladder unremarkable. Pancreas: No focal abnormality or ductal dilatation. Spleen: No  focal abnormality.  Normal size. Adrenals/Urinary Tract: No adrenal abnormality. No focal renal abnormality. No stones  or hydronephrosis. Urinary bladder is unremarkable. Stomach/Bowel: There is diffuse colonic wall thickening and stranding around the colon compatible with pancolitis. Stomach and small bowel decompressed, unremarkable. No bowel obstruction. Vascular/Lymphatic: Aortic atherosclerosis. No evidence of aneurysm or adenopathy. Reproductive: No visible focal abnormality. Other: No free fluid or free air. Musculoskeletal: No acute bony abnormality. IMPRESSION: Diffuse colonic wall thickening and surrounding stranding compatible with pancolitis. Electronically Signed   By: Rolm Baptise M.D.   On: 12/13/2021 17:21    My personal review of EKG: Rhythm NSR, with PACs, reading of A-fib appears inaccurate as having pes before each QRS, QTc of 503   Assessment & Plan:    Principal Problem:   Colitis Active Problems:   Dyslipidemia, goal LDL below 70   CAD S/P percutaneous coronary angioplasty - PCI LAD in 2010, & 2016   OSA on CPAP   Essential hypertension   AKI (acute kidney injury) ([place])   Hypokalemia   Hyponatremia  Pancolitis Sepsis, present on admission -Patient presents with abdominal pain, nausea and diarrhea, blood with stools. -Finding concerning for infectious diarrhea, C. difficile is negative, GI panel is pending. -Given his facility, sepsis, will start on IV Rocephin and Flagyl, with close follow-up to his GI pathogen, where antibiotic might need to be discontinued if certain pathogen are growing(such as Shiga producing toxin producing E. Coli) -No history of inflammatory bowel disease, no family history of Crohn's disease or ulcerative colitis -Patient had positive Cologuard test in 2021, in 2022 with poor bowel prep, discussed at length with the patient that he needs to follow with GI regarding repeat colonoscopy. -For now we will continue with IV fluids, hydrate, and follow  on GI panel. -We will hold aspirin given Hemoccult positive stool. -We will consult GI regarding further recommendations  AKI -Creatinine is 2.6, baseline is 1, this is volume depletion and dehydration, continue with IV fluids and hold nephrotoxic medications  Hyponatremia -Hypovolemic, continue with IV NS  Hypokalemia -Repleting, will check magnesium as well  Prolonged QTc -Likely due to electrolyte abnormalities, monitoring telemetry, replete potassium, check magnesium  Polycythemia -Secondary to volume depletion and dehydration  Hyperbilirubinemia -Due to infectious process, monitor closely  CAD -We will hold aspirin given Hemoccult positive stool, he denies any chest pain, continue with statin and metoprolol for now, will hold Imdur until blood pressure is more stable  Hyperlipidemia -Continue with home dose statin  OSA -Continue with CPAP  Hypertension -Continue with metoprolol, hold Lasix and Imdur  Frailty, deconditioning -multiple falls, significant bruising, reports 6 fall last week, will consult PT/OT  Chronic diastolic CHF -Volume depleted, monitor closely as on IV fluids  -Continue to hold Lasix  obesity Body mass index is 32.69 kg/m.      DVT Prophylaxis SCDs, he is Hemoccult positive, but likely due to infectious colitis, so we will start chemical DVT prophylaxis next 24 hours if hemoglobin is stable  AM Labs Ordered, also please review Full Orders  Family Communication: Admission, patients condition and plan of care including tests being ordered have been discussed with the patient and daughter and granddaughter at bedside* who indicate understanding and agree with the plan and Code Status.  Code Status full  Likely DC to home  Condition GUARDED   Consults called: GI requested in epic  Admission status: Inpatient  Time spent in minutes : 75 minutes   Phillips Climes M.D on 12/13/2021 at [place] PM   [facility] - Office   [phone]

## 2021-12-14 DIAGNOSIS — I5032 Chronic diastolic (congestive) heart failure: Secondary | ICD-10-CM | POA: Diagnosis not present

## 2021-12-14 DIAGNOSIS — E785 Hyperlipidemia, unspecified: Secondary | ICD-10-CM

## 2021-12-14 DIAGNOSIS — K5100 Ulcerative (chronic) pancolitis without complications: Secondary | ICD-10-CM

## 2021-12-14 DIAGNOSIS — E876 Hypokalemia: Secondary | ICD-10-CM

## 2021-12-14 DIAGNOSIS — G4733 Obstructive sleep apnea (adult) (pediatric): Secondary | ICD-10-CM

## 2021-12-14 DIAGNOSIS — N179 Acute kidney failure, unspecified: Secondary | ICD-10-CM

## 2021-12-14 DIAGNOSIS — K529 Noninfective gastroenteritis and colitis, unspecified: Secondary | ICD-10-CM

## 2021-12-14 DIAGNOSIS — Z9989 Dependence on other enabling machines and devices: Secondary | ICD-10-CM

## 2021-12-14 DIAGNOSIS — I10 Essential (primary) hypertension: Secondary | ICD-10-CM

## 2021-12-14 DIAGNOSIS — E871 Hypo-osmolality and hyponatremia: Secondary | ICD-10-CM

## 2021-12-14 DIAGNOSIS — E860 Dehydration: Secondary | ICD-10-CM

## 2021-12-14 LAB — GASTROINTESTINAL PANEL BY PCR, STOOL (REPLACES STOOL CULTURE)
Adenovirus F40/41: NOT DETECTED
Astrovirus: NOT DETECTED
Campylobacter species: NOT DETECTED
Cryptosporidium: NOT DETECTED
Cyclospora cayetanensis: NOT DETECTED
Entamoeba histolytica: NOT DETECTED
Enteroaggregative E coli (EAEC): DETECTED — AB
Enteropathogenic E coli (EPEC): NOT DETECTED
Enterotoxigenic E coli (ETEC): NOT DETECTED
Giardia lamblia: NOT DETECTED
Norovirus GI/GII: NOT DETECTED
Plesimonas shigelloides: NOT DETECTED
Rotavirus A: NOT DETECTED
Salmonella species: NOT DETECTED
Sapovirus (I, II, IV, and V): NOT DETECTED
Shiga like toxin producing E coli (STEC): NOT DETECTED
Shigella/Enteroinvasive E coli (EIEC): DETECTED — AB
Vibrio cholerae: NOT DETECTED
Vibrio species: NOT DETECTED
Yersinia enterocolitica: NOT DETECTED

## 2021-12-14 LAB — BASIC METABOLIC PANEL
Anion gap: 12 (ref 5–15)
BUN: 34 mg/dL — ABNORMAL HIGH (ref 8–23)
CO2: 17 mmol/L — ABNORMAL LOW (ref 22–32)
Calcium: 7.9 mg/dL — ABNORMAL LOW (ref 8.9–10.3)
Chloride: 96 mmol/L — ABNORMAL LOW (ref 98–111)
Creatinine, Ser: 2 mg/dL — ABNORMAL HIGH (ref 0.61–1.24)
GFR, Estimated: 35 mL/min — ABNORMAL LOW (ref >60)
Glucose, Bld: 148 mg/dL — ABNORMAL HIGH (ref 70–99)
Potassium: 3 mmol/L — ABNORMAL LOW (ref 3.5–5.1)
Sodium: 125 mmol/L — ABNORMAL LOW (ref 135–145)

## 2021-12-14 LAB — URINALYSIS, ROUTINE W REFLEX MICROSCOPIC
Bilirubin Urine: NEGATIVE
Glucose, UA: NEGATIVE mg/dL
Hgb urine dipstick: NEGATIVE
Ketones, ur: 5 mg/dL — AB
Leukocytes,Ua: NEGATIVE
Nitrite: NEGATIVE
Protein, ur: NEGATIVE mg/dL
Specific Gravity, Urine: 1.011 (ref 1.005–1.030)
pH: 6 (ref 5.0–8.0)

## 2021-12-14 LAB — HIV ANTIBODY (ROUTINE TESTING W REFLEX): HIV Screen 4th Generation wRfx: NONREACTIVE

## 2021-12-14 LAB — CBC
HCT: 43.5 % (ref 39.0–52.0)
Hemoglobin: 15.5 g/dL (ref 13.0–17.0)
MCH: 32.4 pg (ref 26.0–34.0)
MCHC: 35.6 g/dL (ref 30.0–36.0)
MCV: 90.8 fL (ref 80.0–100.0)
Platelets: 236 10*3/uL (ref 150–400)
RBC: 4.79 MIL/uL (ref 4.22–5.81)
RDW: 13.7 % (ref 11.5–15.5)
WBC: 8.2 10*3/uL (ref 4.0–10.5)
nRBC: 0 % (ref 0.0–0.2)

## 2021-12-14 LAB — LACTIC ACID, PLASMA: Lactic Acid, Venous: 1.2 mmol/L (ref 0.5–1.9)

## 2021-12-14 MED ORDER — POTASSIUM CHLORIDE CRYS ER 20 MEQ PO TBCR
40.0000 meq | EXTENDED_RELEASE_TABLET | ORAL | Status: AC
  Administered 2021-12-14 (×2): 40 meq via ORAL
  Filled 2021-12-14 (×2): qty 2

## 2021-12-14 MED ORDER — PROSOURCE PLUS PO LIQD
30.0000 mL | Freq: Three times a day (TID) | ORAL | Status: DC
  Administered 2021-12-14 – 2021-12-16 (×7): 30 mL via ORAL
  Filled 2021-12-14 (×12): qty 30

## 2021-12-14 MED ORDER — ENOXAPARIN SODIUM 40 MG/0.4ML IJ SOSY
40.0000 mg | PREFILLED_SYRINGE | INTRAMUSCULAR | Status: DC
Start: 2021-12-14 — End: 2021-12-19
  Administered 2021-12-14 – 2021-12-19 (×6): 40 mg via SUBCUTANEOUS
  Filled 2021-12-14 (×6): qty 0.4

## 2021-12-14 NOTE — Consult Note (Signed)
Referring Provider: No ref. provider found Primary Care Physician:  Rosalee Kaufman, PA-C Primary Gastroenterologist:  Dr.Kasia Trego  Reason for Consultation: Nausea, vomiting, pancolitis on CT, bloody diarrhea  HPI: Mr. Hyser is a pleasant 71 year old [place] resident with a history of CAD/congestive heart failure who was in his usual state of fairly good health until approximately 10 days ago when he developed acute onset of nausea, vomiting, abdominal pain along with copious diarrhea.  Stool admixed with blood.  He was seen at [facility] on 7/26.  Diagnosed with a viral syndrome.  He continued to have primarily incapacitating bloody diarrhea.  He came to the [facility] emergency department yesterday when he was evaluated and admitted to the hospital.  ED evaluation revealed elevated lactic acid level 2.7.  White count 11.4; today 8.2 ; hemoglobin remains normal in the 15.5 range. Noncontrast abdominal pelvic CT demonstrated changes consistent with diffuse colonic wall thickening along with pericolonic stranding consistent with a pan colitis.  C. difficile toxin assay has come back negative.  GI pathogen panel is pending.  He was started on Rocephin and metronidazole in the ED.  He has remained hemodynamically stable overnight.  Patient endorses prior to 10 days ago he was in good GI health he had no nausea, vomiting, abdominal pain; appetite was good  - he had normal stool daily.  No sick contacts;  no recent antibiotics; no travel.  Colonoscopy for screening 1 year ago at [facility] was thwarted by a poor prep.  By report, it was an incomplete examination.  He does have a positive family history of polyps in first-degree relative and grandparents with a history of colon cancer  He specifically denies chronic symptoms of GERD, odynophagia, dysphagia, early satiety.  He denies chronic symptoms of hematochezia or melena..  He does take various NSAIDs on a regular basis.  Past  Medical History:  Diagnosis Date   Anxiety    HX PANIC ATTACKS   CHF (congestive heart failure) (HCC)    Chronic cough    Coronary artery disease    a. s/p DES to LAD and PTCA of D1 in 2010 b. cath in 2016 showing patent stent with 50% stenosis of jailed D1 c. cath in 10/2017 showing patent LAD stent with 70% D1 stenosis and 80% RV branch stenosis and medical management recommended   Dry eyes    Fibromyalgia    Fluid retention    GERD (gastroesophageal reflux disease)    Headache(784.0)    Herpes genitalis in men    History of kidney stones    History of vertigo    Hypertension    Narcolepsy    Neuropathy    Penile lesion    Sjogren's disease ([place])    Sleep apnea    USES C-PAP    Past Surgical History:  Procedure Laterality Date   BREAST SURGERY     BIL MASTECTOMY  FOR BENIGN LUMPS   CARDIAC CATHETERIZATION N/A 01/09/2015   Procedure: Left Heart Cath and Coronary Angiography;  Surgeon: Leonie Man, MD;  Location: [place] [facility];  Service: Cardiovascular;  Laterality: N/A;   CARPAL TUNNELL  BIL   CIRCUMCISION     CORONARY ANGIOPLASTY WITH STENT PLACEMENT  X1 2010   HERNIA REPAIR     ING HERNIA REPAIR   LEFT HEART CATH AND CORONARY ANGIOGRAPHY N/A 10/15/2017   Procedure: LEFT HEART CATH AND CORONARY ANGIOGRAPHY;  Surgeon: Martinique, Peter M, MD;  Location: [place] [facility];  Service: Cardiovascular;  Laterality: N/A;   LEFT HEART CATHETERIZATION WITH CORONARY ANGIOGRAM N/A 10/11/2013   Procedure: LEFT HEART CATHETERIZATION WITH CORONARY ANGIOGRAM;  Surgeon: Sinclair Grooms, MD;  Location: [facility] CATH LAB;  Service: Cardiovascular;  Laterality: N/A;   LEFT HEART CATHETERIZATION WITH CORONARY ANGIOGRAM N/A 06/21/2014   Procedure: LEFT HEART CATHETERIZATION WITH CORONARY ANGIOGRAM;  Surgeon: Troy Sine, MD;  Location: [facility] CATH LAB;  Service: Cardiovascular;  Laterality: N/A;   MEATOTOMY  X2   PERCUTANEOUS CORONARY STENT INTERVENTION (PCI-S) N/A 06/23/2014   Procedure:  PERCUTANEOUS CORONARY STENT INTERVENTION (PCI-S);  Surgeon: Jettie Booze, MD;  Location: [facility] CATH LAB;  Service: Cardiovascular;  Laterality: N/A;   VASECTOMY     WRIST FRACTURE SURGERY  L  X3 SURGERIES    Prior to Admission medications   Medication Sig Start Date End Date Taking? Authorizing Provider  aspirin EC 81 MG tablet Take 81 mg by mouth at bedtime.    Yes [provider]  atorvastatin (LIPITOR) 40 MG tablet TAKE 1 TABLET IN THE EVENING 11/11/21  Yes Strader, [place] M, PA-C  b complex vitamins tablet Take 1 tablet by mouth daily.   Yes [provider]  betamethasone valerate ointment (VALISONE) 0.1 % Apply 1 application topically 2 (two) times daily as needed (itching).   Yes [provider]  Butalbital-APAP-Caffeine [id number] MG capsule Take 1-2 capsules by mouth as needed for headache. 08/07/17  Yes [provider]  Cholecalciferol (VITAMIN D) 2000 units tablet Take 2,000 Units by mouth daily.   Yes [provider]  citalopram (CELEXA) 20 MG tablet Take 20 mg by mouth daily.  01/18/15  Yes [provider]  clonazePAM (KLONOPIN) 0.5 MG tablet Take 0.5 mg by mouth See admin instructions. Take 1 or 2 tablets as needed for panick attacks   Yes [provider]  COENZYME Q10 PO Take by mouth.   Yes [provider]  diphenhydrAMINE (BENADRYL) 25 MG tablet Take 25-50 mg by mouth 2 (two) times daily as needed for itching.    Yes [provider]  fluticasone (CUTIVATE) 0.05 % cream Apply 1 application topically 2 (two) times daily as needed (rash). Applied to face and/or penis area(s) 02/14/15  Yes [provider]  furosemide (LASIX) 40 MG tablet Take 80 mg by mouth daily.   Yes [provider]  guaifenesin (HUMIBID E) 400 MG TABS tablet Take 800 mg by mouth daily as needed (for post nasal drip/cough).    Yes [provider]  HYDROmorphone (DILAUDID) 2 MG tablet Take 2 mg by mouth every 3  (three) hours as needed for severe pain. 04/23/20  Yes [provider]  ibuprofen (ADVIL,MOTRIN) 200 MG tablet Take 600 mg by mouth 4 (four) times daily as needed for mild pain or moderate pain.    Yes [provider]  isosorbide mononitrate (IMDUR) 120 MG 24 hr tablet TAKE 1 TABLET EVERY DAY Patient taking differently: 120 mg. Take 120 mg in the AM and 30 mg in the PM 01/23/21  Yes Branch, Alphonse Guild, MD  L-Lysine 500 MG TABS Take 500 mg by mouth daily.   Yes [provider]  loperamide (IMODIUM A-D) 2 MG tablet Take 6 mg by mouth 4 (four) times daily as needed for diarrhea or loose stools.   Yes [provider]  metoprolol succinate (TOPROL-XL) 50 MG 24 hr tablet Take 1 tablet (50 mg total) by mouth daily. Take with or immediately following a meal. 12/01/18 12/13/21  Yes Herminio Commons, MD  Multiple Vitamins-Minerals (MULTIVITAMIN WITH MINERALS) tablet Take 1 tablet by mouth every evening.    Yes [provider]  naproxen sodium (ALEVE) 220 MG tablet Take 220 mg by mouth.   Yes [provider]  nitroGLYCERIN (NITROSTAT) 0.4 MG SL tablet Take one every 5 minutes as needed for chest pain 10/10/21  Yes Strader, [place] M, PA-C  NON FORMULARY at bedtime. CPAP   Yes [provider]  omeprazole (PRILOSEC) 40 MG capsule Take 40 mg by mouth daily before breakfast.   Yes [provider]  testosterone cypionate (DEPOTESTOTERONE CYPIONATE) 100 MG/ML injection Inject 100 mg into the muscle every 7 (seven) days.   Yes [provider]  topiramate (TOPAMAX) 25 MG tablet Take 50 mg by mouth at bedtime.   Yes [provider]  Turmeric (QC TUMERIC COMPLEX PO) Take by mouth.   Yes [provider]  vitamin C (ASCORBIC ACID) 500 MG tablet Take 500 mg by mouth every morning.   Yes [provider]  polyvinyl alcohol-povidone (HYPOTEARS) 1.4-0.6 % ophthalmic solution Place 1-2 drops into both eyes daily as needed  (dry eyes). For dry eyes    [provider]    Current Facility-Administered Medications  Medication Dose Route Frequency Provider Last Rate Last Admin   (feeding supplement) PROSource Plus liquid 30 mL  30 mL Oral TID BM Barton Dubois, MD   30 mL at 12/14/21 0955   0.9 % NaCl with KCl 20 mEq/ L  infusion   Intravenous Continuous Elgergawy, Silver Huguenin, MD 100 mL/hr at 12/14/21 0330 Restarted at 12/14/21 0330   0.9 % NaCl with KCl 20 mEq/ L  infusion   Intravenous Continuous Elgergawy, Silver Huguenin, MD       acetaminophen (TYLENOL) tablet 650 mg  650 mg Oral Q6H PRN Elgergawy, Silver Huguenin, MD       Or   acetaminophen (TYLENOL) suppository 650 mg  650 mg Rectal Q6H PRN Elgergawy, Silver Huguenin, MD       ascorbic acid (VITAMIN C) tablet 500 mg  500 mg Oral q morning Elgergawy, Silver Huguenin, MD   500 mg at 12/14/21 3267   B-complex with vitamin C tablet 1 tablet  1 tablet Oral Daily Elgergawy, Silver Huguenin, MD   1 tablet at 12/14/21 0816   cefTRIAXone (ROCEPHIN) 2 g in sodium chloride 0.9 % 100 mL IVPB  2 g Intravenous Q24H Elgergawy, Silver Huguenin, MD   Stopped at 12/14/21 0130   cholecalciferol (VITAMIN D3) 25 MCG (1000 UNIT) tablet 2,000 Units  2,000 Units Oral Daily Elgergawy, Silver Huguenin, MD   2,000 Units at 12/14/21 0816   citalopram (CELEXA) tablet 20 mg  20 mg Oral Daily Elgergawy, Silver Huguenin, MD   20 mg at 12/14/21 0816   clonazePAM (KLONOPIN) tablet 0.5 mg  0.5 mg Oral Daily PRN Elgergawy, Silver Huguenin, MD   0.5 mg at 12/13/21 2043   enoxaparin (LOVENOX) injection 40 mg  40 mg Subcutaneous Q24H Barton Dubois, MD   40 mg at 12/14/21 0955   feeding supplement (BOOST / RESOURCE BREEZE) liquid 1 Container  1 Container Oral TID BM Elgergawy, Silver Huguenin, MD   1 Container at 12/14/21 0816   HYDROmorphone (DILAUDID) tablet 2 mg  2 mg Oral Q3H PRN Elgergawy, Silver Huguenin, MD   2 mg at 12/14/21 0954   metoprolol succinate (TOPROL-XL) 24 hr tablet 50 mg  50 mg Oral Daily Elgergawy, Silver Huguenin, MD   50 mg at  12/14/21 0815    metroNIDAZOLE (FLAGYL) IVPB 500 mg  500 mg Intravenous Q12H Elgergawy, Silver Huguenin, MD 100 mL/hr at 12/14/21 0819 500 mg at 12/14/21 0819   multivitamin with minerals tablet 1 tablet  1 tablet Oral QPM Elgergawy, Silver Huguenin, MD   1 tablet at 12/13/21 2220   nitroGLYCERIN (NITROSTAT) SL tablet 0.4 mg  0.4 mg Sublingual Q5 min PRN Elgergawy, Silver Huguenin, MD       ondansetron (ZOFRAN) tablet 4 mg  4 mg Oral Q6H PRN Elgergawy, Silver Huguenin, MD       Or   ondansetron (ZOFRAN) injection 4 mg  4 mg Intravenous Q6H PRN Elgergawy, Silver Huguenin, MD       pantoprazole (PROTONIX) EC tablet 40 mg  40 mg Oral Daily Elgergawy, Silver Huguenin, MD   40 mg at 12/14/21 0816   potassium chloride SA (KLOR-CON M) CR tablet 40 mEq  40 mEq Oral Q4H Barton Dubois, MD   40 mEq at 12/14/21 0954   topiramate (TOPAMAX) tablet 50 mg  50 mg Oral QHS Elgergawy, Silver Huguenin, MD   50 mg at 12/13/21 2220    Allergies as of 12/13/2021 - Review Complete 12/13/2021  Allergen Reaction Noted   Bee venom Anaphylaxis 01/09/2015   Duloxetine Swelling    Fentanyl Other (See Comments) 10/10/2013   Lidocaine Other (See Comments)    Meperidine hcl Other (See Comments) 06/13/2008   Methadone Other (See Comments) 06/13/2008   Oxycodone hcl Other (See Comments) 06/13/2008   Pregabalin Other (See Comments)    Zolpidem Other (See Comments) 01/02/2021   Zolpidem tartrate Other (See Comments)     Family History  Problem Relation Age of Onset   Heart disease Other     Social History   Socioeconomic History   Marital status: Married    Spouse name: Not on file   Number of children: Not on file   Years of education: Not on file   Highest education level: Not on file  Occupational History   Not on file  Tobacco Use   Smoking status: Former    Packs/day: 2.50    Years: 32.00    Total pack years: 80.00    Types: Cigarettes    Start date: 04/17/1967    Quit date: 05/12/2000    Years since quitting: 21.6   Smokeless tobacco: Never  Vaping Use   Vaping  Use: Never used  Substance and Sexual Activity   Alcohol use: No    Alcohol/week: 0.0 standard drinks of alcohol    Comment: 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   Drug use: No   Sexual activity: Not on file  Other Topics Concern   Not on file  Social History Narrative   Not on file   Social Determinants of Health   Financial Resource Strain: Not on file  Food Insecurity: Not on file  Transportation Needs: Not on file  Physical Activity: Not on file  Stress: Not on file  Social Connections: Not on file  Intimate Partner Violence: Not on file    Review of Systems:  As in history of present illness  Physical Exam: Vital signs in last 24 hours: Temp:  [97.3 F (36.3 C)-98.5 F (36.9 C)] 98.5 F (36.9 C) (08/05 0234) Pulse Rate:  [70-135] 83 (08/05 0234) Resp:  [12-20] 13 (08/04 2245) BP: (90-138)/(62-94) 134/69 (08/05 0234) SpO2:  [78 %-100 %] 100 % (08/04 2245) Weight:  [97.5 kg] 97.5 kg (08/04 1351) Last BM Date : 12/14/21 General:  Alert, somewhat disheveled appearing pleasant and cooperative in NAD Lungs:  Clear throughout to auscultation.   No wheezes, crackles, or rhonchi. No acute distress. Heart:  Regular rate and rhythm; no murmurs, clicks, rubs,  or gallops. Abdomen: Full.  Positive bowel sounds.  Soft with mild diffuse tenderness.  No mass organomegaly.  Intake/Output from previous day: 08/04 0701 - 08/05 0700 In: 1319.2 [P.O.:240; I.V.:281.7; IV Piggyback:797.5] Out: -  Intake/Output this shift: Total I/O In: 120 [P.O.:120] Out: -   Lab Results: Recent Labs    12/13/21 1413 12/14/21 0529  WBC 11.4* 8.2  HGB 17.7* 15.5  HCT 50.3 43.5  PLT 263 236   BMET Recent Labs    12/13/21 1413 12/14/21 0529  NA 125* 125*  K 3.1* 3.0*  CL 91* 96*  CO2 20* 17*  GLUCOSE 156* 148*  BUN 32* 34*  CREATININE 2.26* 2.00*  CALCIUM 8.3* 7.9*   LFT Recent Labs    12/13/21 1413  PROT 6.5  ALBUMIN 2.5*  AST 24  ALT 22  ALKPHOS 62  BILITOT 1.9*    PT/INR Recent Labs    12/13/21 1859  LABPROT 15.0  INR 1.2   Hepatitis Panel No results for input(s): "HEPBSAG", "HCVAB", "HEPAIGM", "HEPBIGM" in the last 72 hours. C-Diff Recent Labs    12/13/21 1619  CDIFFTOX NEGATIVE    Studies/Results: DG Chest Port 1 View  Result Date: 12/13/2021 CLINICAL DATA:  Abdominal pain, loose stools for 1 week EXAM: PORTABLE CHEST 1 VIEW COMPARISON:  12/06/2021 FINDINGS: Single frontal view of the chest demonstrates an unremarkable cardiac silhouette. No acute airspace disease, effusion, or pneumothorax. No acute bony abnormality. IMPRESSION: 1. No acute intrathoracic process. Electronically Signed   By: Randa Ngo M.D.   On: 12/13/2021 17:59   CT Abdomen Pelvis Wo Contrast  Result Date: 12/13/2021 CLINICAL DATA:  Abdominal pain, acute, nonlocalized EXAM: CT ABDOMEN AND PELVIS WITHOUT CONTRAST TECHNIQUE: Multidetector CT imaging of the abdomen and pelvis was performed following the standard protocol without IV contrast. RADIATION DOSE REDUCTION: This exam was performed according to the departmental dose-optimization program which includes automated exposure control, adjustment of the mA and/or kV according to patient size and/or use of iterative reconstruction technique. COMPARISON:  None Available. FINDINGS: Lower chest: No acute findings Hepatobiliary: No focal hepatic abnormality. Gallbladder unremarkable. Pancreas: No focal abnormality or ductal dilatation. Spleen: No focal abnormality.  Normal size. Adrenals/Urinary Tract: No adrenal abnormality. No focal renal abnormality. No stones or hydronephrosis. Urinary bladder is unremarkable. Stomach/Bowel: There is diffuse colonic wall thickening and stranding around the colon compatible with pancolitis. Stomach and small bowel decompressed, unremarkable. No bowel obstruction. Vascular/Lymphatic: Aortic atherosclerosis. No evidence of aneurysm or adenopathy. Reproductive: No visible focal abnormality. Other:  No free fluid or free air. Musculoskeletal: No acute bony abnormality. IMPRESSION: Diffuse colonic wall thickening and surrounding stranding compatible with pancolitis. Electronically Signed   By: Rolm Baptise M.D.   On: 12/13/2021 17:21    Impression: 71 year old gentleman with history of CAD/CHF in his usual state of good health until 10 days ago when he developed nausea, vomiting and copious bloody diarrhea.  He has pancolitis on CT.  C. difficile toxin assay has come back negative.  He has been started on metronidazole and Rocephin empirically.  GIP in process.  Need to consider the usual suspects causing dysentary including Salmonella, Shigella, Campylobacter, E. coli 01H57, etc.  Illness not consistent with ischemia.  Regular significant NSAID use could be a cofactor in his acute illness but  not the primary problem.  Incomplete colonoscopy last year.  Positive family history of polyps and colon cancer.  Recommendations  I agree with beginning empiric antibiotics in this clinical setting.  Await GI panel results.  IV fluid, electrolyte support.  Would hope to see clinical improvement in the next 24 to 48 hours.  No colonoscopic evaluation at this time.  However, he needs a high-quality optical colonoscopy at a minimum in a more elective setting once he is over acute illness.  Further recommendations to follow.  I reviewed my findings and recommendations with the patient and the patient's Sister Alesia Morin, at bedside.    Notice:  This dictation was prepared with Dragon dictation along with smaller phrase technology. Any transcriptional errors that result from this process are unintentional and may not be corrected upon review.

## 2021-12-14 NOTE — Plan of Care (Signed)
  Problem: Pain Managment: Goal: General experience of comfort will improve Outcome: Progressing   Problem: Safety: Goal: Ability to remain free from injury will improve Outcome: Progressing   Problem: Skin Integrity: Goal: Risk for impaired skin integrity will decrease Outcome: Progressing   

## 2021-12-14 NOTE — Assessment & Plan Note (Addendum)
-  Holding Imdur and Lasix in the setting of soft blood pressure -Continue metoprolol -Follow vital signs -Heart healthy diet discussed with patient.

## 2021-12-14 NOTE — Assessment & Plan Note (Addendum)
-  Setting of prerenal azotemia and dehydration from GI losses -Continue fluid resuscitation and follow renal function trend -Follow electrolytes and replete as needed. -Creatinine 1.04 today and his BUN 15; acute kidney process resolved.

## 2021-12-14 NOTE — Progress Notes (Signed)
Progress Note   Patient: [name] TDV:[id number] DOB: June 06, 1950 DOA: 12/13/2021     1 DOS: the patient was seen and examined on 12/14/2021   Brief hospital admission course: As per H&P written by Dr. Waldron Labs on a [place]  is a 71 y.o. male, with past medical history of CAD, status post PCI, hypertension, chronic diastolic CHF, Sjogren's disease, kidney stones, patient presents to ED secondary to complaints of abdominal pain and diarrhea, patient reports symptoms has been going on for last 10 days, started last Wednesday, started to have nausea, vomiting, diarrhea, fever and chills; initially improved, but then symptoms has worsened, with worsening abdominal pain and diarrhea, as well he does report red stools with his diarrhea, no coffee-ground emesis; patient reported good oral intake and appetite. He visited [facility] ED on 7/28, where he was discharged with low blood pressure of 95 systolic, x-ray with no acute findings, patient had colonoscopy in 2022, due for work-up of screening test concerning for malignancy, but had poor bowel prep. - in ED patient was noted to have sodium of 125, potassium of 3.1, chloride of 91, creatinine of 2.26, white blood cell count of 11.4, hemoglobin 17.7, CT abdomen pelvis without contrast was significant for pancolitis, he was Hemoccult positive, [facility] hospitalist consulted to admit.  Assessment and Plan: * Colitis - Negative C. difficile PCR -Continue empiric antibiotics, fluid resuscitation and electrolyte repletion -Patient expressed feeling better -Follow GI pathogen panel.  Chronic diastolic CHF (congestive heart failure) (HCC) - Appears to be compensated currently -Continue daily weights and strict intake and output -Okay to continue IV fluids for now -Continue metoprolol.  Hyponatremia - In the setting of dehydration and diuretic usage -Continue fluid resuscitation -Continue holding diuretics -Follow electrolytes  trend.  Hypokalemia - In the setting of GI losses and decreased oral intake Continue holding diuretics currently. -Magnesium within normal limit -Will continue aggressive potassium repletion.  AKI (acute kidney injury) ([place]) - Setting of prerenal azotemia and dehydration from GI losses -Continue fluid resuscitation and follow renal function trend -Follow electrolytes and replete as needed.  Essential hypertension - Holding Imdur and Lasix in the setting of soft blood pressure -Continue metoprolol -Follow vital signs -Heart healthy diet discussed with patient.  OSA on CPAP - Continue CPAP nightly.  CAD S/P percutaneous coronary angioplasty - PCI LAD in 2010, & 2016 - No chest pain or shortness of breath reported. -EKG/telemetry without acute ischemic changes -Continue statins and metoprolol. -Given soft blood pressure will hold Imdur; given positive fecal occult blood test will hold aspirin. -Follow clinical response.  Dyslipidemia, goal LDL below 70 - Continue statins.    Subjective:  Reports feeling better; no chest pain, no nausea or vomiting.  Currently afebrile.  Still having some loose stools but significantly improved.  Tolerating diet.  Physical Exam: Vitals:   12/14/21 1039 12/14/21 1407 12/14/21 1752 12/14/21 1759  BP: (!) 98/44 (!) 101/57 (!) 102/59   Pulse: 78 69 (!) 50 64  Resp: '20 19 20   '$ Temp: 97.9 F (36.6 C) 97.7 F (36.5 C) 97.6 F (36.4 C)   TempSrc:   Oral   SpO2: 100% 100%  100%  Weight:      Height:       General exam: Alert, awake, oriented x 3; reports feeling better.  Currently afebrile.  Reports no nausea or vomiting.  Still having some vague diffuse abdominal discomfort. Respiratory system: Clear to auscultation. Respiratory effort normal.  No requiring oxygen supplementation at  this point. Cardiovascular system:Rate controlled; no rubs or gallops. Gastrointestinal system: Abdomen is nondistended, soft and mildly tender to palpation in  his mid abdomen and lower quadrants.  Bowel sounds heard. Central nervous system: Alert and oriented. No focal neurological deficits. Extremities: No cyanosis or clubbing. Skin: No petechiae. Psychiatry: Judgement and insight appear normal. Mood & affect appropriate.   Data Reviewed: HIV non-reactive Basic metabolic panel demonstrating sodium 125, potassium 3.0, chloride 96, bicarb 17, BUN 34 and creatinine 2.0 CBC: WBCs 8.2, hemoglobin 15.5, platelet count 236 K Lactic acid plasma :1.2   Family Communication: Sister at bedside.  Disposition: Status is: Inpatient Remains inpatient appropriate because: Receiving empiric antibiotics; still requiring IV fluid resuscitation and electrolyte repletion.  Follow GI service recommendation.  Planned Discharge Destination: Home  Author: Barton Dubois, MD 12/14/2021 6:11 PM  For on call review [email or web address].

## 2021-12-14 NOTE — Assessment & Plan Note (Addendum)
-   Negative C. difficile PCR -GI panel positive for Shigella/E. Coli -Metronidazole discontinued and patient has completed IV antibiotics as recommended by GI service -Started on oral ciprofloxacin as recommended. -No nausea or vomiting. -Still with ongoing diarrhea and Flexi-Seal in place; intermittent crampy troponin. -Continue as needed use of Bentyl. -Continue to maintain adequate hydration and replete electrolytes as needed. -Appreciate assistance and recommendation by GI service. -Outpatient colonoscopy once over acute illness recommended.

## 2021-12-14 NOTE — Assessment & Plan Note (Addendum)
Continue CPAP nightly. °

## 2021-12-14 NOTE — Assessment & Plan Note (Addendum)
-  In the setting of GI losses and decreased oral intake -Continue holding diuretics currently. -Magnesium within normal limit -Will to replete potassium and follow trend. -Potassium 3.6 currently.

## 2021-12-14 NOTE — Assessment & Plan Note (Addendum)
-  No chest pain or shortness of breath reported. -EKG/telemetry without acute ischemic changes -Continue statins and metoprolol. -Given soft blood pressure will continue to hold Imdur; given positive fecal occult blood test will hold aspirin as well for now. -Follow clinical response.

## 2021-12-14 NOTE — TOC Progression Note (Signed)
Transition of Care [facility]) - Progression Note    Patient Details  Name: [name] MRN: [id number] Date of Birth: 1950-08-20  Transition of Care [facility]) CM/SW Contact  Salome Arnt, [place] Phone Number: 12/14/2021, 9:19 AM  Clinical Narrative:   Transition of Care [facility]) Screening Note   Patient Details  Name: [name] Date of Birth: 03-28-1951   Transition of Care [facility]) CM/SW Contact:    Salome Arnt, [place] Phone Number: 12/14/2021, 9:19 AM    Transition of Care Department [facility]) has reviewed patient and no TOC needs have been identified at this time. We will continue to monitor patient advancement through interdisciplinary progression rounds. If new patient transition needs arise, please place a TOC consult.            Expected Discharge Plan and Services                                                 Social Determinants of Health (SDOH) Interventions    Readmission Risk Interventions     No data to display

## 2021-12-14 NOTE — Plan of Care (Signed)
  Problem: Acute Rehab PT Goals(only PT should resolve) Goal: Pt Will Go Supine/Side To Sit Outcome: Progressing Flowsheets (Taken 12/14/2021 1313) Pt will go Supine/Side to Sit:  with modified independence  with supervision Goal: Patient Will Transfer Sit To/From Stand Outcome: Progressing Flowsheets (Taken 12/14/2021 1313) Patient will transfer sit to/from stand:  with min guard assist  with supervision Goal: Pt Will Transfer Bed To Chair/Chair To Bed Outcome: Progressing Flowsheets (Taken 12/14/2021 1313) Pt will Transfer Bed to Chair/Chair to Bed:  with supervision  min guard assist Goal: Pt Will Ambulate Outcome: Progressing Flowsheets (Taken 12/14/2021 1313) Pt will Ambulate:  50 feet  with min guard assist  with supervision  with rolling walker   1:13 PM, 12/14/21 Lonell Grandchild, MPT Physical Therapist with [facility] [phone] office [phone] mobile phone

## 2021-12-14 NOTE — NC FL2 (Signed)
[place] MEDICAID FL2 LEVEL OF CARE SCREENING TOOL     IDENTIFICATION  Patient Name: [name] Birthdate: 11-07-1950 Sex: male Admission Date (Current Location): 12/13/2021  [facility] and [place] Number:  [employer] and Address:  [facility] [address] [place]      Provider Number: [id number]  Attending Physician Name and Address:  Barton Dubois, MD  Relative Name and Phone Number:       Current Level of Care: Hospital Recommended Level of Care: [place] Prior Approval Number:    Date Approved/Denied:   PASRR Number: pending  Discharge Plan: SNF    Current Diagnoses: Patient Active Problem List   Diagnosis Date Noted   Colitis 12/13/2021   AKI (acute kidney injury) ([place]) 12/13/2021   Hypokalemia 12/13/2021   Hyponatremia 12/13/2021   DOE (dyspnea on exertion)    S/P coronary artery stent placement    Angina pectoris (HCC)    Chest pain of uncertain etiology 36/64/4034   Acute diastolic heart failure- EF 50% 10/12/2013   OSA on CPAP 10/10/2013   NSTEMI (non-ST elevated myocardial infarction) ([place]) 10/10/2013   Essential hypertension 10/10/2013   Dyslipidemia, goal LDL below 70 06/07/2009   IDIOPATHIC PERIPHERAL AUTONOMIC NEUROPATHY UNSP 06/13/2008   CAD S/P percutaneous coronary angioplasty - PCI LAD in 2010, & 2016 06/13/2008   DYSPNEA 06/13/2008    Orientation RESPIRATION BLADDER Height & Weight     Self, Time, Situation, Place  O2 (2L) Continent Weight: 215 lb (97.5 kg) Height:  '5\' 8"'$  (172.7 cm)  BEHAVIORAL SYMPTOMS/MOOD NEUROLOGICAL BOWEL NUTRITION STATUS      Continent Diet (See d/c summary)  AMBULATORY STATUS COMMUNICATION OF NEEDS Skin   Limited Assist Verbally Other (Comment) (Bilateral redness to perineum and scrotum.)                       Personal Care Assistance Level of Assistance  Bathing, Feeding, Dressing Bathing Assistance: Limited assistance Feeding assistance:  Limited assistance Dressing Assistance: Limited assistance     Functional Limitations Info  Sight, Hearing, Speech Sight Info: Adequate Hearing Info: Adequate Speech Info: Adequate    SPECIAL CARE FACTORS FREQUENCY  PT (By licensed PT)     PT Frequency: 5x weekly              Contractures      Additional Factors Info  Code Status, Allergies, Psychotropic, Isolation Precautions Code Status Info: Full code Allergies Info: Bee Venom, Duloxetine, Lidocaine, Meperidine Hcl, Methadone, Oxycodone Hcl, Pregabalin, Zolpidem, Zolpidem Tartrate Psychotropic Info: Celexa, Klonopin   Isolation Precautions Info: Enteric precautions     Current Medications (12/14/2021):  This is the current hospital active medication list Current Facility-Administered Medications  Medication Dose Route Frequency Provider Last Rate Last Admin   (feeding supplement) PROSource Plus liquid 30 mL  30 mL Oral TID BM Barton Dubois, MD   30 mL at 12/14/21 1453   0.9 % NaCl with KCl 20 mEq/ L  infusion   Intravenous Continuous Elgergawy, Silver Huguenin, MD 75 mL/hr at 12/14/21 1452 Restarted at 12/14/21 1452   acetaminophen (TYLENOL) tablet 650 mg  650 mg Oral Q6H PRN Elgergawy, Silver Huguenin, MD       Or   acetaminophen (TYLENOL) suppository 650 mg  650 mg Rectal Q6H PRN Elgergawy, Silver Huguenin, MD       ascorbic acid (VITAMIN C) tablet 500 mg  500 mg Oral q morning Elgergawy, Silver Huguenin, MD  500 mg at 12/14/21 0816   B-complex with vitamin C tablet 1 tablet  1 tablet Oral Daily Elgergawy, Silver Huguenin, MD   1 tablet at 12/14/21 0816   cefTRIAXone (ROCEPHIN) 2 g in sodium chloride 0.9 % 100 mL IVPB  2 g Intravenous Q24H Elgergawy, Silver Huguenin, MD   Stopped at 12/14/21 0130   cholecalciferol (VITAMIN D3) 25 MCG (1000 UNIT) tablet 2,000 Units  2,000 Units Oral Daily Elgergawy, Silver Huguenin, MD   2,000 Units at 12/14/21 0816   citalopram (CELEXA) tablet 20 mg  20 mg Oral Daily Elgergawy, Silver Huguenin, MD   20 mg at 12/14/21 0816   clonazePAM  (KLONOPIN) tablet 0.5 mg  0.5 mg Oral Daily PRN Elgergawy, Silver Huguenin, MD   0.5 mg at 12/13/21 2043   enoxaparin (LOVENOX) injection 40 mg  40 mg Subcutaneous Q24H Barton Dubois, MD   40 mg at 12/14/21 0955   feeding supplement (BOOST / RESOURCE BREEZE) liquid 1 Container  1 Container Oral TID BM Elgergawy, Silver Huguenin, MD   1 Container at 12/14/21 0816   HYDROmorphone (DILAUDID) tablet 2 mg  2 mg Oral Q3H PRN Elgergawy, Silver Huguenin, MD   2 mg at 12/14/21 0954   metoprolol succinate (TOPROL-XL) 24 hr tablet 50 mg  50 mg Oral Daily Elgergawy, Silver Huguenin, MD   50 mg at 12/14/21 0815   metroNIDAZOLE (FLAGYL) IVPB 500 mg  500 mg Intravenous Q12H Elgergawy, Silver Huguenin, MD 100 mL/hr at 12/14/21 0819 500 mg at 12/14/21 0819   multivitamin with minerals tablet 1 tablet  1 tablet Oral QPM Elgergawy, Silver Huguenin, MD   1 tablet at 12/13/21 2220   nitroGLYCERIN (NITROSTAT) SL tablet 0.4 mg  0.4 mg Sublingual Q5 min PRN Elgergawy, Silver Huguenin, MD       ondansetron (ZOFRAN) tablet 4 mg  4 mg Oral Q6H PRN Elgergawy, Silver Huguenin, MD       Or   ondansetron (ZOFRAN) injection 4 mg  4 mg Intravenous Q6H PRN Elgergawy, Silver Huguenin, MD       pantoprazole (PROTONIX) EC tablet 40 mg  40 mg Oral Daily Elgergawy, Silver Huguenin, MD   40 mg at 12/14/21 0816   topiramate (TOPAMAX) tablet 50 mg  50 mg Oral QHS Elgergawy, Silver Huguenin, MD   50 mg at 12/13/21 2220     Discharge Medications: Please see discharge summary for a list of discharge medications.  Relevant Imaging Results:  Relevant Lab Results:   Additional Information SSN: [id number]  Salome Arnt, LCSW

## 2021-12-14 NOTE — TOC Initial Note (Signed)
Transition of Care [facility]) - Initial/Assessment Note    Patient Details  Name: [name] MRN: [id number] Date of Birth: [birth year]  Transition of Care [facility]) CM/SW Contact:    Salome Arnt, [place] Phone Number: 12/14/2021, 12:53 PM  Clinical Narrative: Pt admitted due to colitis. Pt reports he lives alone and is usually independent with ADLs. He has a cane and walker for ambulating. PT evaluated pt and recommend SNF. LCSW discussed placement process and pt is not sure if he wants to go to rehab. However, he is willing for TOC to initiate bed search in [place] while he considers. TOC will follow up with bed offers and to discuss further.                   Expected Discharge Plan: Skilled Nursing Facility Barriers to Discharge: Continued Medical Work up   Patient Goals and CMS Choice Patient states their goals for this hospitalization and ongoing recovery are:: considering rehab   Choice offered to / list presented to : Patient  Expected Discharge Plan and Services Expected Discharge Plan: [place] In-house Referral: Clinical Social Work     Living arrangements for the past 2 months: Single Family Home                                      Prior Living Arrangements/Services Living arrangements for the past 2 months: Single Family Home Lives with:: Self Patient language and need for interpreter reviewed:: Yes Do you feel safe going back to the place where you live?: Yes      Need for Family Participation in Patient Care: No (Comment) Care giver support system in place?: No (comment) Current home services: DME (walker, cane, wheelchair, 3N1) Criminal Activity/Legal Involvement Pertinent to Current Situation/Hospitalization: No - Comment as needed  Activities of Daily Living Home Assistive Devices/Equipment: Cane (specify quad or straight) ADL Screening (condition at time of admission) Patient's cognitive ability adequate to safely complete  daily activities?: Yes Is the patient deaf or have difficulty hearing?: No Does the patient have difficulty seeing, even when wearing glasses/contacts?: Yes Does the patient have difficulty concentrating, remembering, or making decisions?: No Patient able to express need for assistance with ADLs?: Yes Does the patient have difficulty dressing or bathing?: No Independently performs ADLs?: Yes (appropriate for developmental age) Does the patient have difficulty walking or climbing stairs?: No Weakness of Legs: None Weakness of Arms/Hands: None  Permission Sought/Granted                  Emotional Assessment     Affect (typically observed): Appropriate Orientation: : Oriented to Self, Oriented to Place, Oriented to  Time, Oriented to Situation Alcohol / Substance Use: Not Applicable Psych Involvement: No (comment)  Admission diagnosis:  Hypokalemia [E87.6] Colitis [K52.9] Hyponatremia [E87.1] Pancolitis ([place]) [K51.00] AKI (acute kidney injury) ([place]) [N17.9] Diarrhea, unspecified type [R19.7] Patient Active Problem List   Diagnosis Date Noted   Colitis 12/13/2021   AKI (acute kidney injury) ([place]) 12/13/2021   Hypokalemia 12/13/2021   Hyponatremia 12/13/2021   DOE (dyspnea on exertion)    S/P coronary artery stent placement    Angina pectoris ([place])    Chest pain of uncertain etiology 51/76/1607   Acute diastolic heart failure- EF 50% 10/12/2013   OSA on CPAP 10/10/2013   NSTEMI (non-ST elevated myocardial infarction) ([place]) 10/10/2013   Essential hypertension 10/10/2013  Dyslipidemia, goal LDL below 70 06/07/2009   IDIOPATHIC PERIPHERAL AUTONOMIC NEUROPATHY UNSP 06/13/2008   CAD S/P percutaneous coronary angioplasty - PCI LAD in 2010, & 2016 06/13/2008   DYSPNEA 06/13/2008   PCP:  Rosalee Kaufman, PA-C Pharmacy:   [place], [place] [address] Phone: [phone] Fax:  [phone]  [place], [place] [address] Phone: [phone] Fax: [phone]  [place], [place] [address] [place] [address] Phone: [phone] Fax: [phone]     Social Determinants of Health (SDOH) Interventions    Readmission Risk Interventions     No data to display

## 2021-12-14 NOTE — Assessment & Plan Note (Addendum)
-   Continue statins. -Heart healthy/low-fat diet discussed with patient.

## 2021-12-14 NOTE — Progress Notes (Signed)
  DOB: 2050-11-24 Date: 12/14/21   Must ID: [id number]   To Whom it May Concern:  Please be advised that the above named patient will require a short-term nursing home stay- anticipated 30 days or less rehabilitation and strengthening. The plan is for return home.

## 2021-12-14 NOTE — Assessment & Plan Note (Addendum)
-  In the setting of dehydration and diuretic usage -Continue holding diuretics and maintain adequate hydration. -Follow electrolytes trend; sodium 130 currently.

## 2021-12-14 NOTE — Assessment & Plan Note (Addendum)
-  Appears to be compensated currently -Continue metoprolol. -Continue to maintain adequate hydration. -Continue to follow daily weights, strict intake and output and low-sodium diet.Marland Kitchen

## 2021-12-14 NOTE — Evaluation (Signed)
Physical Therapy Evaluation Patient Details Name: ZYGMUNT MCGLINN MRN: [id number] DOB: 03-05-1951 Today's Date: 12/14/2021  History of Present Illness  Jefferie Holston  is a 71 y.o. male, with past medical history of CAD, status post PCI, hypertension, chronic diastolic CHF, Sjogren's disease, kidney stones, patient presents to ED secondary to complaints of abdominal pain and diarrhea, patient reports symptoms has been going on for last 10 days, started last Wednesday, started to have nausea, vomiting, diarrhea, fever and chills, initial improvement, but then symptoms has worsened, with worsening abdominal pain and diarrhea, as well he does report red stools with his diarrhea, no coffee-ground emesis, will intake and appetite, and had [facility] ED visit 7/28, where he was discharged with low blood pressure of 95 systolic, x-ray with no acute findings, patient had colonoscopy in 2022, due for work-up of screening test concerning for malignancy, but had poor bowel prep.   Clinical Impression  Patient demonstrates slow labored movement for sitting up at bedside, very unsteady on feet when completing sit to stand requiring Min/mod assist hand held assist and required use of RW for safety due to BLE weakness.  Patient able to ambulate in room and just out side of doorway before having to return to bedside secondary to c/o fatigue/generalized weakness.  Patient tolerated sitting up in chair after therapy with his sister present in room - RN aware.  Patient will benefit from continued skilled physical therapy in hospital and recommended venue below to increase strength, balance, endurance for safe ADLs and gait.          Recommendations for follow up therapy are one component of a multi-disciplinary discharge planning process, led by the attending physician.  Recommendations may be updated based on patient status, additional functional criteria and insurance authorization.  Follow Up Recommendations  Skilled nursing-short term rehab (<3 hours/day) Can patient physically be transported by private vehicle: Yes    Assistance Recommended at Discharge Set up Supervision/Assistance  Patient can return home with the following  A little help with walking and/or transfers;A little help with bathing/dressing/bathroom;Help with stairs or ramp for entrance;Assistance with cooking/housework    Equipment Recommendations None recommended by PT  Recommendations for Other Services       Functional Status Assessment Patient has had a recent decline in their functional status and demonstrates the ability to make significant improvements in function in a reasonable and predictable amount of time.     Precautions / Restrictions Precautions Precautions: Fall Restrictions Weight Bearing Restrictions: No      Mobility  Bed Mobility Overal bed mobility: Needs Assistance Bed Mobility: Supine to Sit     Supine to sit: Min guard, Min assist     General bed mobility comments: increased time, labored movement    Transfers Overall transfer level: Needs assistance Equipment used: Rolling walker (2 wheels), 1 person hand held assist Transfers: Sit to/from Stand, Bed to chair/wheelchair/BSC Sit to Stand: Min assist, Mod assist   Step pivot transfers: Min assist       General transfer comment: had diffiuclty standing with hand held assist due to BLE weakness, required use of RW for safety    Ambulation/Gait Ambulation/Gait assistance: Min assist Gait Distance (Feet): 25 Feet Assistive device: Rolling walker (2 wheels) Gait Pattern/deviations: Decreased step length - right, Decreased step length - left, Decreased stride length Gait velocity: decreased     General Gait Details: slow labored cadence without loss of balance, limited due to fatigue and generalized weakness  Stairs  Wheelchair Mobility    Modified Rankin (Stroke Patients Only)       Balance Overall  balance assessment: Needs assistance Sitting-balance support: Feet supported, No upper extremity supported Sitting balance-Leahy Scale: Good Sitting balance - Comments: seated at EOB   Standing balance support: During functional activity, Single extremity supported Standing balance-Leahy Scale: Poor Standing balance comment: fair using RW                             Pertinent Vitals/Pain Pain Assessment Pain Assessment: 0-10 Pain Score: 7  Pain Location: stomach Pain Descriptors / Indicators: Aching, Discomfort Pain Intervention(s): Limited activity within patient's tolerance, Monitored during session, Repositioned    Home Living Family/patient expects to be discharged to:: Private residence Living Arrangements: Alone Available Help at Discharge: Family;Available PRN/intermittently Type of Home: House Home Access: Stairs to enter Entrance Stairs-Rails: Right;Left;Can reach both Entrance Stairs-Number of Steps: 3 Alternate Level Stairs-Number of Steps: 11 Home Layout: Two level;Bed/bath upstairs Home Equipment: Grab bars - toilet;Rolling Walker (2 wheels);Cane - single point;Shower seat Additional Comments: ambulates with walking stick    Prior Function Prior Level of Function : Independent/Modified Independent             Mobility Comments: Community ambulator using walking stick, drives, does shopping ADLs Comments: Independent     Hand Dominance        Extremity/Trunk Assessment   Upper Extremity Assessment Upper Extremity Assessment: Generalized weakness    Lower Extremity Assessment Lower Extremity Assessment: Generalized weakness    Cervical / Trunk Assessment Cervical / Trunk Assessment: Normal  Communication   Communication: No difficulties  Cognition Arousal/Alertness: Awake/alert Behavior During Therapy: WFL for tasks assessed/performed Overall Cognitive Status: Within Functional Limits for tasks assessed                                           General Comments      Exercises     Assessment/Plan    PT Assessment Patient needs continued PT services  PT Problem List Decreased strength;Decreased activity tolerance;Decreased balance;Decreased mobility       PT Treatment Interventions DME instruction;Gait training;Stair training;Functional mobility training;Therapeutic activities;Therapeutic exercise;Patient/family education;Balance training    PT Goals (Current goals can be found in the Care Plan section)  Acute Rehab PT Goals Patient Stated Goal: return home with family to assist PT Goal Formulation: With patient/family Time For Goal Achievement: 12/28/21 Potential to Achieve Goals: Good    Frequency Min 3X/week     Co-evaluation               AM-PAC PT "6 Clicks" Mobility  Outcome Measure Help needed turning from your back to your side while in a flat bed without using bedrails?: A Little Help needed moving from lying on your back to sitting on the side of a flat bed without using bedrails?: A Little Help needed moving to and from a bed to a chair (including a wheelchair)?: A Little Help needed standing up from a chair using your arms (e.g., wheelchair or bedside chair)?: A Lot Help needed to walk in hospital room?: A Little Help needed climbing 3-5 steps with a railing? : A Lot 6 Click Score: 16    End of Session Equipment Utilized During Treatment: Oxygen Activity Tolerance: Patient tolerated treatment well;Patient limited by fatigue Patient left: in chair;with call bell/phone  within reach Nurse Communication: Mobility status PT Visit Diagnosis: Unsteadiness on feet (R26.81);Other abnormalities of gait and mobility (R26.89);Muscle weakness (generalized) (M62.81)    Time: 1100-1135 PT Time Calculation (min) (ACUTE ONLY): 35 min   Charges:   PT Evaluation $PT Eval Moderate Complexity: 1 Mod PT Treatments $Therapeutic Activity: 23-37 mins        1:10 PM,  12/14/21 Lonell Grandchild, MPT Physical Therapist with [facility] [phone] office [phone] mobile phone

## 2021-12-14 NOTE — Progress Notes (Signed)
Initial Nutrition Assessment  DOCUMENTATION CODES:   Obesity unspecified  INTERVENTION:   -Continue MVI with minerals daily -Continue Boost Breeze po TID, each supplement provides 250 kcal and 9 grams of  -30 ml Prosource Plus TID, each supplement provides 100 kcals and 15 grams protein -RD will follow for diet advancement and adjust supplement regimen as appropriate  NUTRITION DIAGNOSIS:   Increased nutrient needs related to acute illness as evidenced by estimated needs.  GOAL:   Patient will meet greater than or equal to 90% of their needs  MONITOR:   PO intake, Supplement acceptance, Diet advancement  REASON FOR ASSESSMENT:   Malnutrition Screening Tool    ASSESSMENT:   Pt with past medical history of CAD, status post PCI, hypertension, chronic diastolic CHF, Sjogren's disease, kidney stones, patient presents secondary to complaints of abdominal pain and diarrhea,  Pt admitted with pancolits and sepsis.   Reviewed I/O's: +1.3 L x 24 hours  GI consult pending. Pt with positive cologuard test in 2021 and poor prep colonoscopy in 2022.    Pt unavailable at time of visit. Attempted to speak with pt via call to hospital room phone, however, unable to reach. RD unable to obtain further nutrition-related history or complete nutrition-focused physical exam at this time.    Pt is currently on a clear liquid diet. No meal completion data available to assess at this time.   Reviewed wt hx; pt has experienced a 7.2% wt loss over the past 10 months, which is not significant for time frame.  Medications reviewed and include vitamin C, B-complex with vitamin C, vitamin D3, and 0.9% NaCl with KCl 20 mEq/L infusion @ 100 ml/hr.   Lab Results  Component Value Date   HGBA1C 5.8 (H) 10/10/2013   PTA DM medications are none.   Labs reviewed: Na: 125, K: 3, CBGS: 119 (inpatient orders for glycemic control are none).    Diet Order:   Diet Order             Diet clear liquid  Room service appropriate? Yes; Fluid consistency: Thin  Diet effective now                   EDUCATION NEEDS:   No education needs have been identified at this time  Skin:  Skin Assessment: Reviewed RN Assessment  Last BM:  12/14/21 (type 7)  Height:   Ht Readings from Last 1 Encounters:  12/13/21 '5\' 8"'$  (1.727 m)    Weight:   Wt Readings from Last 1 Encounters:  12/13/21 97.5 kg    Ideal Body Weight:  70 kg  BMI:  Body mass index is 32.69 kg/m.  Estimated Nutritional Needs:   Kcal:  1900-2100  Protein:  105-120 grams  Fluid:  > 1.9 L    Loistine Chance, RD, LDN, [place] Registered Dietitian II Certified Diabetes Care and Education Specialist Please refer to [facility] for RD and/or RD on-call/weekend/after hours pager

## 2021-12-14 NOTE — Progress Notes (Signed)
Lab called with gastro results.   [facility]   and  [facility].  Secure chatted this information to Dr. Dyann Kief

## 2021-12-14 NOTE — TOC Progression Note (Signed)
Transition of Care [facility]) - Progression Note    Patient Details  Name: GRANITE GODMAN MRN: [id number] Date of Birth: 07-Mar-1951  Transition of Care [facility]) CM/SW Contact  Salome Arnt, Lane Phone Number: 12/14/2021, 4:36 PM  Clinical Narrative:  Family state pt interested in [facility]. Referral sent. PASRR started- pending as pt is [place] resident.      Expected Discharge Plan: [place] Barriers to Discharge: Continued Medical Work up  Expected Discharge Plan and Services Expected Discharge Plan: [place] In-house Referral: Clinical Social Work     Living arrangements for the past 2 months: Single Family Home                                       Social Determinants of Health (SDOH) Interventions    Readmission Risk Interventions     No data to display

## 2021-12-15 DIAGNOSIS — N179 Acute kidney failure, unspecified: Secondary | ICD-10-CM | POA: Diagnosis not present

## 2021-12-15 DIAGNOSIS — E785 Hyperlipidemia, unspecified: Secondary | ICD-10-CM | POA: Diagnosis not present

## 2021-12-15 DIAGNOSIS — K529 Noninfective gastroenteritis and colitis, unspecified: Secondary | ICD-10-CM | POA: Diagnosis not present

## 2021-12-15 DIAGNOSIS — I5032 Chronic diastolic (congestive) heart failure: Secondary | ICD-10-CM | POA: Diagnosis not present

## 2021-12-15 LAB — BASIC METABOLIC PANEL
Anion gap: 5 (ref 5–15)
BUN: 22 mg/dL (ref 8–23)
CO2: 19 mmol/L — ABNORMAL LOW (ref 22–32)
Calcium: 7.9 mg/dL — ABNORMAL LOW (ref 8.9–10.3)
Chloride: 107 mmol/L (ref 98–111)
Creatinine, Ser: 1.21 mg/dL (ref 0.61–1.24)
GFR, Estimated: >60 mL/min (ref >60)
Glucose, Bld: 119 mg/dL — ABNORMAL HIGH (ref 70–99)
Potassium: 3.4 mmol/L — ABNORMAL LOW (ref 3.5–5.1)
Sodium: 131 mmol/L — ABNORMAL LOW (ref 135–145)

## 2021-12-15 LAB — CBC
HCT: 42.3 % (ref 39.0–52.0)
Hemoglobin: 14.9 g/dL (ref 13.0–17.0)
MCH: 32.8 pg (ref 26.0–34.0)
MCHC: 35.2 g/dL (ref 30.0–36.0)
MCV: 93.2 fL (ref 80.0–100.0)
Platelets: 255 10*3/uL (ref 150–400)
RBC: 4.54 MIL/uL (ref 4.22–5.81)
RDW: 13.9 % (ref 11.5–15.5)
WBC: 7.5 10*3/uL (ref 4.0–10.5)
nRBC: 0 % (ref 0.0–0.2)

## 2021-12-15 LAB — URINE CULTURE: Culture: NO GROWTH

## 2021-12-15 LAB — MAGNESIUM: Magnesium: 2.1 mg/dL (ref 1.7–2.4)

## 2021-12-15 MED ORDER — CIPROFLOXACIN HCL 250 MG PO TABS
500.0000 mg | ORAL_TABLET | Freq: Two times a day (BID) | ORAL | Status: DC
  Administered 2021-12-15 – 2021-12-19 (×9): 500 mg via ORAL
  Filled 2021-12-15 (×9): qty 2

## 2021-12-15 MED ORDER — CIPROFLOXACIN HCL 250 MG PO TABS: 500.0000 mg | ORAL_TABLET | Freq: Two times a day (BID) | ORAL | Status: DC

## 2021-12-15 MED ORDER — POTASSIUM CHLORIDE CRYS ER 20 MEQ PO TBCR
20.0000 meq | EXTENDED_RELEASE_TABLET | Freq: Once | ORAL | Status: AC
  Administered 2021-12-15: 20 meq via ORAL
  Filled 2021-12-15: qty 1

## 2021-12-15 MED ORDER — DICYCLOMINE HCL 10 MG PO CAPS
10.0000 mg | ORAL_CAPSULE | Freq: Three times a day (TID) | ORAL | Status: DC | PRN
  Administered 2021-12-15 – 2021-12-18 (×6): 10 mg via ORAL
  Filled 2021-12-15 (×7): qty 1

## 2021-12-15 NOTE — Progress Notes (Addendum)
Diarrhea has slowed overnight.  Tolerating full liquids.  Hungry; wants more to eat.  GIP positive for enteroaggregate E. coli and Shigella.  Vital signs in last 24 hours: Temp:  [97.6 F (36.4 C)-98.5 F (36.9 C)] 98.5 F (36.9 C) (08/06 0515) Pulse Rate:  [50-78] 61 (08/06 0515) Resp:  [14-20] 20 (08/06 0515) BP: (98-111)/(44-59) 109/55 (08/06 0515) SpO2:  [97 %-100 %] 99 % (08/06 0515) Last BM Date : 12/15/21 General:   Alert, frail appearing pleasant and cooperative in NAD Abdomen: Full.  Positive bowel sounds.  Very minimal diffuse tenderness to palpation.   Extremities:  Without clubbing or edema.    Intake/Output from previous day: 08/05 0701 - 08/06 0700 In: 3109.4 [P.O.:1060; I.V.:1919.4; IV Piggyback:100] Out: 4525 [Urine:2425; Stool:2100] Intake/Output this shift: No intake/output data recorded.  Lab Results: Recent Labs    12/13/21 1413 12/14/21 0529 12/15/21 0423  WBC 11.4* 8.2 7.5  HGB 17.7* 15.5 14.9  HCT 50.3 43.5 42.3  PLT 263 236 255   BMET Recent Labs    12/13/21 1413 12/14/21 0529 12/15/21 0423  NA 125* 125* 131*  K 3.1* 3.0* 3.4*  CL 91* 96* 107  CO2 20* 17* 19*  GLUCOSE 156* 148* 119*  BUN 32* 34* 22  CREATININE 2.26* 2.00* 1.21  CALCIUM 8.3* 7.9* 7.9*    Impression: Frail 71 year old gentleman admitted to the hospital with bloody diarrhea.  Pancolitis on CT.  GIP positive for Shigella/enteroaggregate E. coli.  Started on metronidazole and ceftriaxone on admission.  Diarrhea slowing.  Tolerating diet.  Wants more to eat.  I see no risk factors for increased quinolone resistance.  Recommendations:  Cipro 500 mg orally twice daily x5 days  Stop metronidazole  Continue ceftriaxone 1 more day and then stop  Low residue heart healthy diet  Curtail outpatient use of NSAIDs until fully recovered from his acute GI illness; then would only resume judiciously.  Outpatient colonoscopy once well over this acute illness.

## 2021-12-15 NOTE — Progress Notes (Signed)
Progress Note   Patient: [name] [id number]:[id number] DOB: [birth year] DOA: 12/13/2021     2 DOS: the patient was seen and examined on 12/15/2021   Brief hospital admission course: As per H&P written by Dr. Waldron Labs on a [place]  is a 71 y.o. male, with past medical history of CAD, status post PCI, hypertension, chronic diastolic CHF, Sjogren's disease, kidney stones, patient presents to ED secondary to complaints of abdominal pain and diarrhea, patient reports symptoms has been going on for last 10 days, started last Wednesday, started to have nausea, vomiting, diarrhea, fever and chills; initially improved, but then symptoms has worsened, with worsening abdominal pain and diarrhea, as well he does report red stools with his diarrhea, no coffee-ground emesis; patient reported good oral intake and appetite. He visited [facility] ED on 7/28, where he was discharged with low blood pressure of 95 systolic, x-ray with no acute findings, patient had colonoscopy in 2022, due for work-up of screening test concerning for malignancy, but had poor bowel prep. - in ED patient was noted to have sodium of 125, potassium of 3.1, chloride of 91, creatinine of 2.26, white blood cell count of 11.4, hemoglobin 17.7, CT abdomen pelvis without contrast was significant for pancolitis, he was Hemoccult positive, [facility] hospitalist consulted to admit.  Assessment and Plan: * Colitis - Negative C. difficile PCR -GI panel positive for Shigella/E. Coli -Metronidazole will be discontinued; will provide 1 more dose of IV Rocephin and transition to oral ciprofloxacin to complete antibiotic therapy. -Continue to maintain adequate hydration and use as needed pain for cramping symptoms. -Appreciate assistance and recommendation by GI service.   Chronic diastolic CHF (congestive heart failure) (HCC) - Appears to be compensated currently -Continue metoprolol. -Holding IV antibiotics fluids at this point. -Continue  to follow daily weights, strict intake and output and low-sodium diet.Marland Kitchen  Hyponatremia - In the setting of dehydration and diuretic usage -Continue fluid resuscitation -Continue holding diuretics -Follow electrolytes trend; sodium 131.  Hypokalemia - In the setting of GI losses and decreased oral intake Continue holding diuretics currently. -Magnesium within normal limit -Will continue aggressive potassium repletion.  AKI (acute kidney injury) ([place]) -Setting of prerenal azotemia and dehydration from GI losses -Continue fluid resuscitation and follow renal function trend -Follow electrolytes and replete as needed. -Creatinine 1.21 today and his BUN 22; acute kidney process resolved.  Essential hypertension - Holding Imdur and Lasix in the setting of soft blood pressure -Continue metoprolol -Follow vital signs -Heart healthy diet discussed with patient.  OSA on CPAP - Continue CPAP nightly.  CAD S/P percutaneous coronary angioplasty - PCI LAD in 2010, & 2016 - No chest pain or shortness of breath reported. -EKG/telemetry without acute ischemic changes -Continue statins and metoprolol. -Given soft blood pressure will hold Imdur; given positive fecal occult blood test will hold aspirin. -Follow clinical response.  Dyslipidemia, goal LDL below 70 - Continue statins.    Subjective:  Patient reports increasing appetite; only 5-6 loose stools per day and having some cramping complaints.  No fever, no nausea, no vomiting, no chest pain, palpitations or shortness of breath.  Physical Exam: Vitals:   12/14/21 2106 12/14/21 2327 12/15/21 0515 12/15/21 1404  BP: (!) 111/58  (!) 109/55 (!) 95/55  Pulse: 68 (!) 56 61 62  Resp: '14 18 20 15  '$ Temp: 97.7 F (36.5 C)  98.5 F (36.9 C) 97.6 F (36.4 C)  TempSrc: Oral     SpO2: 99% 97% 99% 100%  Weight:  Height:       General exam: Alert, awake, oriented x 3; reports feeling much better and having only around 6 loose stools  per day.  No nausea, no vomiting and reporting increased in his appetite. Respiratory system: No wheezing on exam; positive rhonchi.  Good saturation on chronic supplementation. Cardiovascular system:RRR. No rubs or gallops; no JVD. Gastrointestinal system: Abdomen is nondistended, soft and complaining of vague discomfort in his lower abdomen. No organomegaly or masses felt. Normal bowel sounds heard. Central nervous system: Alert and oriented. No focal neurological deficits. Extremities: No cyanosis or clubbing. Skin: No petechiae. Psychiatry: Judgement and insight appear normal. Mood & affect appropriate.   Data Reviewed: HIV non-reactive CBC: WBC 7.5, hemoglobin 14.9, platelets count 255 K Magnesium: 2.1 Basic metabolic panel: Sodium 270, potassium 3.4, chloride 107, bicarb 19, BUN 22, creatinine 1.21   Family Communication: Sister at bedside.  Disposition: Status is: Inpatient Remains inpatient appropriate because: continue electrolytes repletion, one more dose of IV antibiotics and then transition to oral abx's, patient weak and deconditioned will need SNF for short term rehab at discharge.  Planned Discharge Destination: SNF  Author: Barton Dubois, MD 12/15/2021 4:12 PM  For on call review [email or web address].

## 2021-12-15 NOTE — Plan of Care (Signed)
  Problem: Education: Goal: Knowledge of General Education information will improve Description: Including pain rating scale, medication(s)/side effects and non-pharmacologic comfort measures Outcome: Progressing   Problem: Health Behavior/Discharge Planning: Goal: Ability to manage health-related needs will improve Outcome: Progressing   Problem: Clinical Measurements: Goal: Respiratory complications will improve Outcome: Progressing Goal: Cardiovascular complication will be avoided Outcome: Progressing   Problem: Activity: Goal: Risk for activity intolerance will decrease Outcome: Progressing   Problem: Nutrition: Goal: Adequate nutrition will be maintained Outcome: Progressing   Problem: Coping: Goal: Level of anxiety will decrease Outcome: Progressing   Problem: Elimination: Goal: Will not experience complications related to bowel motility Outcome: Progressing Goal: Will not experience complications related to urinary retention Outcome: Progressing   Problem: Pain Managment: Goal: General experience of comfort will improve Outcome: Progressing   Problem: Safety: Goal: Ability to remain free from injury will improve Outcome: Progressing   Problem: Skin Integrity: Goal: Risk for impaired skin integrity will decrease Outcome: Progressing   

## 2021-12-15 NOTE — TOC Progression Note (Addendum)
Transition of Care [facility]) - Progression Note    Patient Details  Name: [name] MRN: [id number] Date of Birth: 1950-07-21  Transition of Care [facility]) CM/SW [place], [place] Phone Number: 12/15/2021, 10:29 AM  Clinical Narrative:    CSW printed clinicals to send to [facility]. Unable to submit to PASRR at this time, awaiting RN to be assigned to pts case. CSW attempted to start insurance auth pending SNF facility. Pt is not in [place] hub, facility will need to start auth. TOC to follow.   Expected Discharge Plan: [place] Barriers to Discharge: Continued Medical Work up  Expected Discharge Plan and Services Expected Discharge Plan: [place] In-house Referral: Clinical Social Work     Living arrangements for the past 2 months: Single Family Home                                       Social Determinants of Health (SDOH) Interventions    Readmission Risk Interventions     No data to display

## 2021-12-16 DIAGNOSIS — E872 Acidosis, unspecified: Secondary | ICD-10-CM | POA: Diagnosis present

## 2021-12-16 DIAGNOSIS — R197 Diarrhea, unspecified: Secondary | ICD-10-CM | POA: Diagnosis not present

## 2021-12-16 DIAGNOSIS — I5032 Chronic diastolic (congestive) heart failure: Secondary | ICD-10-CM | POA: Diagnosis not present

## 2021-12-16 DIAGNOSIS — E785 Hyperlipidemia, unspecified: Secondary | ICD-10-CM | POA: Diagnosis not present

## 2021-12-16 DIAGNOSIS — K529 Noninfective gastroenteritis and colitis, unspecified: Secondary | ICD-10-CM | POA: Diagnosis not present

## 2021-12-16 DIAGNOSIS — N179 Acute kidney failure, unspecified: Secondary | ICD-10-CM | POA: Diagnosis not present

## 2021-12-16 LAB — BASIC METABOLIC PANEL
Anion gap: 4 — ABNORMAL LOW (ref 5–15)
BUN: 15 mg/dL (ref 8–23)
CO2: 19 mmol/L — ABNORMAL LOW (ref 22–32)
Calcium: 7.5 mg/dL — ABNORMAL LOW (ref 8.9–10.3)
Chloride: 107 mmol/L (ref 98–111)
Creatinine, Ser: 1.04 mg/dL (ref 0.61–1.24)
GFR, Estimated: >60 mL/min (ref >60)
Glucose, Bld: 113 mg/dL — ABNORMAL HIGH (ref 70–99)
Potassium: 3.6 mmol/L (ref 3.5–5.1)
Sodium: 130 mmol/L — ABNORMAL LOW (ref 135–145)

## 2021-12-16 MED ORDER — TRIAMCINOLONE ACETONIDE 0.1 % EX OINT
TOPICAL_OINTMENT | Freq: Two times a day (BID) | CUTANEOUS | Status: DC | PRN
  Filled 2021-12-16: qty 15

## 2021-12-16 NOTE — Assessment & Plan Note (Signed)
-   In the setting of diarrhea and dehydration -Continue to follow electrolytes -Continue to maintain adequate hydration and Continue supportive care.

## 2021-12-16 NOTE — Progress Notes (Signed)
Patient refused CPAP for tonight.  Stated he was too dry and seemed to be sleeping well with just the [place].

## 2021-12-16 NOTE — Assessment & Plan Note (Signed)
-   In the setting of Shigella/E. coli colitis -Causing metabolic acidosis -Continue to maintain adequate hydration and continue treatment with antibiotics as dictated by GI. -Will avoid the use of antidiarrheal agents in order to allow toxin from this bacteria to get out of the body.

## 2021-12-16 NOTE — Progress Notes (Signed)
Progress Note   Patient: [name] UEA:[id number] DOB: November 18, 1950 DOA: 12/13/2021     3 DOS: the patient was seen and examined on 12/16/2021   Brief hospital admission course: As per H&P written by Dr. Waldron Labs on a [place]  is a 71 y.o. male, with past medical history of CAD, status post PCI, hypertension, chronic diastolic CHF, Sjogren's disease, kidney stones, patient presents to ED secondary to complaints of abdominal pain and diarrhea, patient reports symptoms has been going on for last 10 days, started last Wednesday, started to have nausea, vomiting, diarrhea, fever and chills; initially improved, but then symptoms has worsened, with worsening abdominal pain and diarrhea, as well he does report red stools with his diarrhea, no coffee-ground emesis; patient reported good oral intake and appetite. He visited [facility] ED on 7/28, where he was discharged with low blood pressure of 95 systolic, x-ray with no acute findings, patient had colonoscopy in 2022, due for work-up of screening test concerning for malignancy, but had poor bowel prep. - in ED patient was noted to have sodium of 125, potassium of 3.1, chloride of 91, creatinine of 2.26, white blood cell count of 11.4, hemoglobin 17.7, CT abdomen pelvis without contrast was significant for pancolitis, he was Hemoccult positive, [facility] hospitalist consulted to admit.  Assessment and Plan: * Sepsis due to Colitis -Patient may criteria at time of admission: Tachycardia, leukocytosis, acute kidney injury and positive source of infection given presence of colitis. - Negative C. difficile PCR -GI panel positive for Shigella/E. Coli -Metronidazole discontinued and patient has completed IV antibiotics as recommended by GI service -Started on oral ciprofloxacin as recommended. -No nausea or vomiting. -Still with ongoing diarrhea and Flexi-Seal in place; intermittent crampy troponin. -Continue as needed use of Bentyl. -Continue to  maintain adequate hydration and replete electrolytes as needed. -Appreciate assistance and recommendation by GI service. -Outpatient colonoscopy once over acute illness recommended.   Metabolic acidosis - In the setting of diarrhea and dehydration -Continue to follow electrolytes -Continue to maintain adequate hydration and Continue supportive care.  Diarrhea - In the setting of Shigella/E. coli colitis -Causing metabolic acidosis -Continue to maintain adequate hydration and continue treatment with antibiotics as dictated by GI. -Will avoid the use of antidiarrheal agents in order to allow toxin from this bacteria to get out of the body.  Chronic diastolic CHF (congestive heart failure) (HCC) -Appears to be compensated currently -Continue metoprolol. -Continue to maintain adequate hydration. -Continue to follow daily weights, strict intake and output and low-sodium diet.Marland Kitchen  Hyponatremia -In the setting of dehydration and diuretic usage -Continue holding diuretics and maintain adequate hydration. -Follow electrolytes trend; sodium 130 currently.  Hypokalemia -In the setting of GI losses and decreased oral intake -Continue holding diuretics currently. -Magnesium within normal limit -Will to replete potassium and follow trend. -Potassium 3.6 currently.  AKI (acute kidney injury) ([place]) -Setting of prerenal azotemia and dehydration from GI losses -Continue fluid resuscitation and follow renal function trend -Follow electrolytes and replete as needed. -Creatinine 1.04 today and his BUN 15; acute kidney process resolved.  Essential hypertension -Holding Imdur and Lasix in the setting of soft blood pressure -Continue metoprolol -Follow vital signs -Heart healthy diet discussed with patient.  OSA on CPAP -Continue CPAP nightly.  CAD S/P percutaneous coronary angioplasty - PCI LAD in 2010, & 2016 -No chest pain or shortness of breath reported. -EKG/telemetry without acute  ischemic changes -Continue statins and metoprolol. -Given soft blood pressure will continue to hold Imdur;  given positive fecal occult blood test will hold aspirin as well for now. -Follow clinical response.  Dyslipidemia, goal LDL below 70 - Continue statins. -Heart healthy/low-fat diet discussed with patient.    Subjective:  Flexi-Seal is still in place; massive episodes of diarrhea overnight/early morning.  Afebrile, no nausea, no vomiting, reports no chest pain or shortness of breath.  Overall feeling slightly better but weak, deconditioned and tired from such a rough night.  Physical Exam: Vitals:   12/15/21 2025 12/15/21 2301 12/16/21 0448 12/16/21 1434  BP: (!) 116/46  112/72 (!) 98/51  Pulse: 64 63 65 63  Resp: '18 18 18 16  '$ Temp: 98.4 F (36.9 C)  98.7 F (37.1 C) 98.9 F (37.2 C)  TempSrc:      SpO2: 100% 100% 100% 100%  Weight:      Height:       General exam: Alert, awake, oriented x 3; reports not feeling good; having a lot of abdominal cramping pain and nonstop massive diarrhea overnight.  Patient is afebrile.  Reports no nausea vomiting. Respiratory system: Good air movement bilaterally; no wheezing or crackles.  Good saturation on chronic supplementation appreciated.  No using accessory muscle. Cardiovascular system: RRR, no rubs, no gallops, no JVD on exam. Gastrointestinal system: Abdomen is nondistended, soft and without guarding; expresses vague diffuse intermittent discomfort.  Positive bowel sounds. Central nervous system: Alert and oriented. No focal neurological deficits. Extremities: No cyanosis or clubbing. Skin: No petechiae. Psychiatry: Judgement and insight appear normal. Mood & affect appropriate.   Data Reviewed: Basic metabolic panel: Sodium 779, potassium 3.6, chloride 107, bicarb 19, glucose 113, BUN 15, creatinine 1.04 anion gap 4 and calcium 7.5.   Family Communication: Sister at bedside.  Disposition: Status is: Inpatient Remains  inpatient appropriate because: continue electrolytes repletion, one more dose of IV antibiotics and then transition to oral abx's, patient weak and deconditioned will need SNF for short term rehab at discharge.  Planned Discharge Destination: SNF  Author: Barton Dubois, MD 12/16/2021 5:40 PM  For on call review [email or web address].

## 2021-12-16 NOTE — Progress Notes (Signed)
OT Cancellation Note  Patient Details Name: [name] MRN: [id number] DOB: [birth year]   Cancelled Treatment:    Reason Eval/Treat Not Completed: Patient declined, no reason specified;Medical issues which prohibited therapy. Attempted evaluation, pt reports abdominal pain and cramping since 1am. Poor ability to stay awake, declined therapy today due to fatigue and pain.    Guadelupe Sabin, OTR/L  [phone] 12/16/2021, 1:34 PM

## 2021-12-16 NOTE — Progress Notes (Signed)
Subjective: Was feeling improved yesterday. Had corn beef hash, biscuit, pears, and another vegetable for dinner last night. Started with abdominal cramping around 1 am this morning and having frequent diarrhea. Total of 6-7 Bms today. Rectal tube in place with 900 mL output which patient reports is all from today as rectal tube was changed last night.  His abdominal cramping has continued to come and go today, worse prior to a bowel movement and does not seem to be affected by meals.  This morning, he had oatmeal, sausage, eggs.  Denies nausea or vomiting.  Reports yesterday, he only had occasional abdominal cramping a couple of times and his stool frequency had improved.   Objective: Vital signs in last 24 hours: Temp:  [97.6 F (36.4 C)-98.7 F (37.1 C)] 98.7 F (37.1 C) (08/07 0448) Pulse Rate:  [62-65] 65 (08/07 0448) Resp:  [15-18] 18 (08/07 0448) BP: (95-116)/(46-72) 112/72 (08/07 0448) SpO2:  [100 %] 100 % (08/07 0448) Last BM Date : 12/16/21 General:   Alert and oriented, pleasant, NAD.  Head:  Normocephalic and atraumatic. Abdomen:  Bowel sounds present, soft, non-distended.  Mild TTP in LLQ and RLQ.  No HSM or hernias noted. No rebound or guarding. No masses appreciated.  Rectal tube in place with 900 ml brown liquid stool.  Extremities:  With trace pedal edema. Neurologic:  Alert and  oriented x4;  grossly normal neurologically. Psych:  Normal mood and affect.  Intake/Output from previous day: 08/06 0701 - 08/07 0700 In: 2923 [P.O.:1560; I.V.:1030.4; IV Piggyback:302.7] Out: 3950 [Urine:2550; Stool:1400] Intake/Output this shift: Total I/O In: 240 [P.O.:240] Out: -   Lab Results: Recent Labs    12/13/21 1413 12/14/21 0529 12/15/21 0423  WBC 11.4* 8.2 7.5  HGB 17.7* 15.5 14.9  HCT 50.3 43.5 42.3  PLT 263 236 255   BMET Recent Labs    12/14/21 0529 12/15/21 0423 12/16/21 0542  NA 125* 131* 130*  K 3.0* 3.4* 3.6  CL 96* 107 107  CO2 17* 19* 19*   GLUCOSE 148* 119* 113*  BUN 34* 22 15  CREATININE 2.00* 1.21 1.04  CALCIUM 7.9* 7.9* 7.5*   LFT Recent Labs    12/13/21 1413  PROT 6.5  ALBUMIN 2.5*  AST 24  ALT 22  ALKPHOS 62  BILITOT 1.9*   PT/INR Recent Labs    12/13/21 1859  LABPROT 15.0  INR 1.2    Assessment: 71 year old male admitted to the hospital with bloody diarrhea, pancolitis on CT, GI pathogen panel positive for Shigella/enteroaggregative E coli.  He was started on metronidazole and ceftriaxone on admission, transitioned to p.o. ciprofloxacin 500 mg BID yesterday. Diarrhea was slowing down and he was tolerating clear liquids well.  Diet was advanced to heart healthy yesterday and he developed abdominal cramping and increased diarrhea earlier this morning.  Total of 900 mL stool output since 1am. No overt GI bleeding.  Hgb wnl yesterday.   Suspect abdominal cramping and increasing diarrhea is related to dietary intake as he had been on clear liquids. Discussed following a bland diet for now to give his colon some time to heal. We will try him on a soft, low fat/low fiber diet and see how he does. Ultimately, may need to back down to full liquids for back to clears if he continues with frequent abdominal cramping and diarrhea.   Plan: Soft, low fat/low fiber diet, bland diet.   Discussed importance of staying well hydrated and replacing losses in the setting of  acute infectious diarrhea.  Continue ciprofloxacin 500 mg BID for a total of 5 days.  Limit NSAIDs until full recovered from acute GI illness, then would only resume judiciously. Patient will need colonoscopy once over acute illness.   LOS: 3 days    12/16/2021, 11:41 AM   Aliene Altes, PA-C [facility]

## 2021-12-16 NOTE — TOC Progression Note (Addendum)
Transition of Care [facility]) - Progression Note    Patient Details  Name: [name] MRN: [id number] Date of Birth: Sep 20, 1950  Transition of Care [facility]) CM/SW Contact  Boneta Lucks, RN Phone Number: 12/16/2021, 10:39 AM  Clinical Narrative:   TOC called patient to discuss bed offers. He has not been vaccinated. [facility] declined. [facility] made a bed offer. Patient is accepting. TOC called admission to start insurance authorization. Daughter updated.   Passr# review cancelled, Patient lives in [place]   Addendum :   [facility] received -  12/16/2021-12/18/2021, next review 8/9, Candace Cruise id [id number], plan auth id [id number]  DC planning for tomorrow. [place] Admission updated.     Expected Discharge Plan: Skilled Nursing Facility Barriers to Discharge: Insurance Authorization  Expected Discharge Plan and Services Expected Discharge Plan: [place] In-house Referral: Clinical Social Work     Living arrangements for the past 2 months: Single Family Home         Readmission Risk Interventions    12/16/2021   10:38 AM  Readmission Risk Prevention Plan  Post Dischage Appt Complete  Medication Screening Complete  Transportation Screening Complete

## 2021-12-17 DIAGNOSIS — K529 Noninfective gastroenteritis and colitis, unspecified: Secondary | ICD-10-CM | POA: Diagnosis not present

## 2021-12-17 DIAGNOSIS — I5032 Chronic diastolic (congestive) heart failure: Secondary | ICD-10-CM | POA: Diagnosis not present

## 2021-12-17 DIAGNOSIS — E785 Hyperlipidemia, unspecified: Secondary | ICD-10-CM | POA: Diagnosis not present

## 2021-12-17 DIAGNOSIS — N179 Acute kidney failure, unspecified: Secondary | ICD-10-CM | POA: Diagnosis not present

## 2021-12-17 MED ORDER — POTASSIUM CHLORIDE CRYS ER 20 MEQ PO TBCR
20.0000 meq | EXTENDED_RELEASE_TABLET | Freq: Every day | ORAL | Status: DC
  Administered 2021-12-17 – 2021-12-19 (×3): 20 meq via ORAL
  Filled 2021-12-17 (×3): qty 1

## 2021-12-17 NOTE — Progress Notes (Signed)
Patient is refusing CPAP for tonight.

## 2021-12-17 NOTE — Progress Notes (Signed)
    Subjective: Feeling better today.  Still with abdominal cramping, but this is improved.  Volume of diarrhea also seems to be improved.  Currently has 400 mL brown liquid stool in rectal tube collection bag and patient reports this was changed last night.  Tolerating his diet well.  Objective: Vital signs in last 24 hours: Temp:  [98.2 F (36.8 C)-98.9 F (37.2 C)] 98.2 F (36.8 C) (08/08 1254) Pulse Rate:  [65-72] 65 (08/08 1254) Resp:  [18-20] 18 (08/08 1254) BP: (94-104)/(52-68) 102/52 (08/08 1254) SpO2:  [99 %-100 %] 100 % (08/08 1254) Last BM Date : 12/17/21 General:   Alert and oriented, pleasant, no acute distress. Head:  Normocephalic and atraumatic. Eyes:  No icterus, sclera clear. Conjuctiva pink.  Abdomen:  Bowel sounds present, soft, non-tender, non-distended. No HSM or hernias noted. No rebound or guarding. No masses appreciated  Msk:  Symmetrical without gross deformities. Normal posture. Extremities:  With pedal edema.  Neurologic:  Alert and  oriented x4;  grossly normal neurologically. Skin:  Warm and dry, intact without significant lesions.  Psych: Normal mood and affect.  Intake/Output from previous day: 08/07 0701 - 08/08 0700 In: 3761.1 [P.O.:1440; I.V.:2321.1] Out: 3300 [Urine:1700; Stool:1600] Intake/Output this shift: Total I/O In: -  Out: 500 [Urine:400; Stool:100]  Lab Results: Recent Labs    12/15/21 0423  WBC 7.5  HGB 14.9  HCT 42.3  PLT 255   BMET Recent Labs    12/15/21 0423 12/16/21 0542  NA 131* 130*  K 3.4* 3.6  CL 107 107  CO2 19* 19*  GLUCOSE 119* 113*  BUN 22 15  CREATININE 1.21 1.04  CALCIUM 7.9* 7.5*    Assessment: 71 year old male admitted to the hospital with bloody diarrhea, pancolitis on CT, GI pathogen panel positive for Shigella/enteroaggregative E coli.  He was started on metronidazole and ceftriaxone on admission, transitioned to p.o. ciprofloxacin 500 mg BID on 12/16/21 with recommendations to treat for  additional 5 days. Abdominal cramping improved.  Diarrhea slowing down.  Total of 400 mL stool output by 3 pm today which is significantly improved from yesterday has he had 900 mL of output by 12 PM yesterday. He is tolerating his diet well. Hgb wnl on 8/6.    Plan: Continue soft, low-fat/low fiber diet, bland diet. Continue to push p.o. fluids to replace losses in the setting of acute infectious diarrhea. Continue ciprofloxacin 500 mg twice daily for total of 5 days. Limit NSAIDs until full recovered from acute GI illness, then would only resume judiciously. Patient will need colonoscopy once over acute illness.   LOS: 4 days    12/17/2021, 2:53 PM   Aliene Altes, [facility]

## 2021-12-17 NOTE — Progress Notes (Signed)
Progress Note   Patient: [name] OHY:[id number] DOB: 1950/10/16 DOA: 12/13/2021     4 DOS: the patient was seen and examined on 12/17/2021   Brief hospital admission course: As per H&P written by Dr. Waldron Labs on a [place]  is a 71 y.o. male, with past medical history of CAD, status post PCI, hypertension, chronic diastolic CHF, Sjogren's disease, kidney stones, patient presents to ED secondary to complaints of abdominal pain and diarrhea, patient reports symptoms has been going on for last 10 days, started last Wednesday, started to have nausea, vomiting, diarrhea, fever and chills; initially improved, but then symptoms has worsened, with worsening abdominal pain and diarrhea, as well he does report red stools with his diarrhea, no coffee-ground emesis; patient reported good oral intake and appetite. He visited [facility] ED on 7/28, where he was discharged with low blood pressure of 95 systolic, x-ray with no acute findings, patient had colonoscopy in 2022, due for work-up of screening test concerning for malignancy, but had poor bowel prep. - in ED patient was noted to have sodium of 125, potassium of 3.1, chloride of 91, creatinine of 2.26, white blood cell count of 11.4, hemoglobin 17.7, CT abdomen pelvis without contrast was significant for pancolitis, he was Hemoccult positive, [facility] hospitalist consulted to admit.  Assessment and Plan: * Sepsis due to Colitis -Patient may criteria at time of admission: Tachycardia, leukocytosis, acute kidney injury and positive source of infection given presence of colitis. - Negative C. difficile PCR -GI panel positive for Shigella/E. Coli -Metronidazole has been discontinued and patient has completed IV antibiotics as recommended by GI service -Started on oral ciprofloxacin as recommended day 2/5. -No nausea or vomiting. -Still with ongoing diarrhea; but difficult to quantify per nursing reports and patient's symptoms -will d/c  flexiseal  d stop IVF's repletion. -follow BMET in am and appreciate how many bowel movement he had; if stable and manageable amount, will discharge to SNF. -Continue as needed use of Bentyl for cramps.. -Continue to maintain adequate hydration and replete electrolytes as needed. -Outpatient colonoscopy once over acute illness recommended.  Metabolic acidosis -In the setting of diarrhea and dehydration -Continue to follow electrolytes -Continue to maintain adequate hydration and Continue supportive care.  Diarrhea - In the setting of Shigella/E. coli colitis -Causing metabolic acidosis. -Continue to maintain adequate hydration and continue treatment with antibiotics as dictated by GI. -Will continue to avoid the use of antidiarrheal agents in order to allow toxin from this bacteria to get out of the body.  Chronic diastolic CHF (congestive heart failure) (HCC) -Appears to be compensated currently -Continue metoprolol. -Continue to maintain adequate hydration. -Continue to follow daily weights, strict intake and output and low-sodium diet.. -Continue outpatient follow-up with cardiology service.  Hyponatremia -In the setting of dehydration and diuretic usage -Continue holding diuretics and maintain adequate hydration. -Follow electrolytes trend; sodium 130 currently.  Hypokalemia -In the setting of GI losses and decreased oral intake -Continue holding diuretics currently. -Magnesium within normal limit -Will to replete potassium and follow trend. -Potassium 3.6 currently.  AKI (acute kidney injury) ([place]) -Setting of prerenal azotemia and dehydration from GI losses -Continue fluid resuscitation and follow renal function trend -Follow electrolytes and replete as needed. -Creatinine 1.04 today and his BUN 15; acute kidney process resolved.  Essential hypertension -Continue holding Imdur and Lasix in the setting of soft blood pressure and ongoing high chances for dehydration  with ongoing diarrhea. -Continue metoprolol -Follow vital signs -Heart healthy diet discussed with  patient.  OSA on CPAP -Continue CPAP nightly -Patient reports stable breathing.  CAD S/P percutaneous coronary angioplasty - PCI LAD in 2010, & 2016 -No chest pain or shortness of breath reported. -EKG/telemetry without acute ischemic changes -Continue statins and metoprolol. -Given soft blood pressure will continue to hold Imdur; given positive fecal occult blood test will hold aspirin as well for now. -Follow clinical response.  Dyslipidemia, goal LDL below 70 -Continue statins. -Heart healthy/low-fat diet discussed with patient.    Subjective:  CC ill still in place; patient reporting multiple episode of diarrhea and also having ongoing intermittent abdominal cramps.  No nausea, no vomiting, no shortness of breath, good saturation on room air and so far tolerating oral antibiotics and advance diet.  Physical Exam: Vitals:   12/16/21 1951 12/16/21 2009 12/17/21 0540 12/17/21 1254  BP:  94/65 104/68 (!) 102/52  Pulse:  66 72 65  Resp:  '19 20 18  '$ Temp: 98.8 F (37.1 C) 98.9 F (37.2 C) 98.3 F (36.8 C) 98.2 F (36.8 C)  TempSrc: Axillary   Oral  SpO2:  100% 99% 100%  Weight:      Height:       General exam: Alert, awake, oriented x 3; no chest pain, no nausea, no vomiting, no shortness of breath.  Still experiencing intermittent cramping and having multiple episodes of diarrhea. Respiratory system: Good saturation on room air; no using accessory muscle. Cardiovascular system:RRR. No rubs or gallops; no JVD. Gastrointestinal system: Abdomen is nondistended, soft and vaguely tender with deep palpation in his lower quadrants/mid abdomen.  Positive bowel sounds.  No guarding.  Central nervous system: Alert and oriented. No focal neurological deficits. Extremities: No C/C/E, +pedal pulses Skin: No petechiae. Psychiatry: Judgement and insight appear normal. Mood & affect  appropriate.   Latest data Reviewed: 0/0/93 Basic metabolic panel: Sodium 818, potassium 3.6, chloride 107, bicarb 19, glucose 113, BUN 15, creatinine 1.04 anion gap 4 and calcium 7.5.  Basic metabolic panel: Ordered for 12/18/2021 again.   Family Communication: Sister at bedside.  Disposition: Status is: Inpatient  Remains inpatient appropriate because: continue electrolytes repletion, so far tolerating oral antibiotics but experiencing multiple episodes of diarrhea making it difficult to maintain hydration orally.  Planned Discharge Destination: SNF; hopefully medically stable for discharge to a 923.  Author: Barton Dubois, MD 12/17/2021 2:00 PM  For on call review [email or web address].

## 2021-12-17 NOTE — Progress Notes (Signed)
6m removed from bulb by suction with syringe, flexiseal removed and pt tolerated well. Peri care and barrier cream applied. Will monitor output at this time. MD aware.

## 2021-12-17 NOTE — Progress Notes (Signed)
Physical Therapy Treatment Patient Details Name: [name] MRN: [id number] DOB: 01/27/1951 Today's Date: 12/17/2021   History of Present Illness [name]  is a 71 y.o. male, with past medical history of CAD, status post PCI, hypertension, chronic diastolic CHF, Sjogren's disease, kidney stones, patient presents to ED secondary to complaints of abdominal pain and diarrhea, patient reports symptoms has been going on for last 10 days, started last Wednesday, started to have nausea, vomiting, diarrhea, fever and chills, initial improvement, but then symptoms has worsened, with worsening abdominal pain and diarrhea, as well he does report red stools with his diarrhea, no coffee-ground emesis, will intake and appetite, and had [facility] ED visit 7/28, where he was discharged with low blood pressure of 95 systolic, x-ray with no acute findings, patient had colonoscopy in 2022, due for work-up of screening test concerning for malignancy, but had poor bowel prep.    PT Comments    Patient demonstrates slightly labored movement for sitting up at bedside, fair/good return for completing BLE ROM/strengthening exercises while seated at bedside with verbal cueing and demonstration.  Patient demonstrates slow labored cadence with flexed trunk during gait training without loss of balance and limited mostly due to fatigue.  Patient tolerated sitting up in chair after therapy.  Patient will benefit from continued skilled physical therapy in hospital and recommended venue below to increase strength, balance, endurance for safe ADLs and gait.      Recommendations for follow up therapy are one component of a multi-disciplinary discharge planning process, led by the attending physician.  Recommendations may be updated based on patient status, additional functional criteria and insurance authorization.  Follow Up Recommendations  Skilled nursing-short term rehab (<3 hours/day) Can patient physically be  transported by private vehicle: Yes   Assistance Recommended at Discharge Set up Supervision/Assistance  Patient can return home with the following A little help with walking and/or transfers;A little help with bathing/dressing/bathroom;Help with stairs or ramp for entrance;Assistance with cooking/housework   Equipment Recommendations  None recommended by PT    Recommendations for Other Services       Precautions / Restrictions Precautions Precautions: Fall Restrictions Weight Bearing Restrictions: No     Mobility  Bed Mobility Overal bed mobility: Needs Assistance Bed Mobility: Supine to Sit     Supine to sit: Supervision, Min guard     General bed mobility comments: slightly labored movement HOB flat    Transfers Overall transfer level: Needs assistance Equipment used: Rolling walker (2 wheels) Transfers: Sit to/from Stand, Bed to chair/wheelchair/BSC Sit to Stand: Mod assist   Step pivot transfers: Min assist       General transfer comment: had difficulty completing sit to stands due to BLE weakness    Ambulation/Gait Ambulation/Gait assistance: Min assist Gait Distance (Feet): 30 Feet Assistive device: Rolling walker (2 wheels) Gait Pattern/deviations: Decreased step length - right, Decreased step length - left, Decreased stride length Gait velocity: decreased     General Gait Details: slightly increased endurance/distance for gait training with slow labored movement and limited mostly due to fatigue   Stairs             Wheelchair Mobility    Modified Rankin (Stroke Patients Only)       Balance Overall balance assessment: Needs assistance Sitting-balance support: Feet supported, No upper extremity supported Sitting balance-Leahy Scale: Good Sitting balance - Comments: seated at EOB   Standing balance support: During functional activity, Reliant on assistive device for balance Standing balance-Leahy Scale:  Fair Standing balance comment:  using RW                            Cognition Arousal/Alertness: Awake/alert Behavior During Therapy: WFL for tasks assessed/performed Overall Cognitive Status: Within Functional Limits for tasks assessed                                          Exercises General Exercises - Lower Extremity Long Arc Quad: Seated, AAROM, Strengthening, Both, 15 reps Hip Flexion/Marching: Seated, AROM, Strengthening, Both, 15 reps Toe Raises: Seated, AROM, Strengthening, Both, 15 reps Heel Raises: Seated, AROM, Strengthening, Both, 15 reps    General Comments General comments (skin integrity, edema, etc.): VSS on 2L      Pertinent Vitals/Pain Pain Assessment Pain Assessment: Faces Faces Pain Scale: Hurts a little bit Pain Location: stomach Pain Descriptors / Indicators: Aching, Discomfort Pain Intervention(s): Limited activity within patient's tolerance, Monitored during session    Home Living Family/patient expects to be discharged to:: Private residence Living Arrangements: Alone Available Help at Discharge: Family;Available PRN/intermittently Type of Home: House Home Access: Stairs to enter Entrance Stairs-Rails: Right;Left;Can reach both Entrance Stairs-Number of Steps: 3 Alternate Level Stairs-Number of Steps: 11 Home Layout: Two level;Bed/bath upstairs Home Equipment: Grab bars - toilet;Rolling Walker (2 wheels);Cane - single point;Shower seat Additional Comments: ambulates with walking stick    Prior Function            PT Goals (current goals can now be found in the care plan section) Acute Rehab PT Goals Patient Stated Goal: return home with family to assist PT Goal Formulation: With patient/family Time For Goal Achievement: 12/28/21 Potential to Achieve Goals: Good Progress towards PT goals: Progressing toward goals    Frequency    Min 3X/week      PT Plan Current plan remains appropriate    Co-evaluation              AM-PAC  PT "6 Clicks" Mobility   Outcome Measure  Help needed turning from your back to your side while in a flat bed without using bedrails?: None Help needed moving from lying on your back to sitting on the side of a flat bed without using bedrails?: A Little Help needed moving to and from a bed to a chair (including a wheelchair)?: A Little Help needed standing up from a chair using your arms (e.g., wheelchair or bedside chair)?: A Lot Help needed to walk in hospital room?: A Little Help needed climbing 3-5 steps with a railing? : A Lot 6 Click Score: 17    End of Session   Activity Tolerance: Patient tolerated treatment well;Patient limited by fatigue Patient left: in chair;with call bell/phone within reach Nurse Communication: Mobility status PT Visit Diagnosis: Unsteadiness on feet (R26.81);Other abnormalities of gait and mobility (R26.89);Muscle weakness (generalized) (M62.81)     Time: 0865-7846 PT Time Calculation (min) (ACUTE ONLY): 28 min  Charges:  $Therapeutic Exercise: 8-22 mins $Therapeutic Activity: 8-22 mins                     2:00 PM, 12/17/21 Lonell Grandchild, MPT Physical Therapist with [facility] [phone] office [phone] mobile phone

## 2021-12-17 NOTE — Evaluation (Signed)
Occupational Therapy Evaluation Patient Details Name: [name] MRN: [id number] DOB: [birth year] Today's Date: 12/17/2021   History of Present Illness [name]  is a 71 y.o. male, with past medical history of CAD, status post PCI, hypertension, chronic diastolic CHF, Sjogren's disease, kidney stones, patient presents to ED secondary to complaints of abdominal pain and diarrhea, patient reports symptoms has been going on for last 10 days, started last Wednesday, started to have nausea, vomiting, diarrhea, fever and chills, initial improvement, but then symptoms has worsened, with worsening abdominal pain and diarrhea, as well he does report red stools with his diarrhea, no coffee-ground emesis, will intake and appetite, and had [facility] ED visit 7/28, where he was discharged with low blood pressure of 95 systolic, x-ray with no acute findings, patient had colonoscopy in 2022, due for work-up of screening test concerning for malignancy, but had poor bowel prep.   Clinical Impression   Pt admitted for concerns listed above. PTA pt reported that he was independent with all ADL's and IADL's, including driving. At this time pt presents with increased weakness, balance deficits, and limited activity tolerance. He is requiring mod A for transfers and up to max A for LB ADL's. Overall his balance is poor, as he is heavily reliant on the RW. Recommending SNF to maximize pt independence and safety. OT will follow acutely.       Recommendations for follow up therapy are one component of a multi-disciplinary discharge planning process, led by the attending physician.  Recommendations may be updated based on patient status, additional functional criteria and insurance authorization.   Follow Up Recommendations  Skilled nursing-short term rehab (<3 hours/day)    Assistance Recommended at Discharge Intermittent Supervision/Assistance  Patient can return home with the following A lot of help with  walking and/or transfers;A lot of help with bathing/dressing/bathroom;Assistance with cooking/housework    Functional Status Assessment  Patient has had a recent decline in their functional status and demonstrates the ability to make significant improvements in function in a reasonable and predictable amount of time.  Equipment Recommendations  None recommended by OT    Recommendations for Other Services       Precautions / Restrictions Precautions Precautions: Fall Restrictions Weight Bearing Restrictions: No      Mobility Bed Mobility Overal bed mobility: Needs Assistance Bed Mobility: Supine to Sit, Sit to Supine     Supine to sit: Min guard Sit to supine: Min guard   General bed mobility comments: Increased time, HOB elevated    Transfers Overall transfer level: Needs assistance Equipment used: Rolling walker (2 wheels) Transfers: Sit to/from Stand Sit to Stand: Mod assist           General transfer comment: Mod A to power up from EOB and to assist with steadying initially in standing      Balance Overall balance assessment: Needs assistance Sitting-balance support: Feet supported, No upper extremity supported Sitting balance-Leahy Scale: Good Sitting balance - Comments: seated at EOB   Standing balance support: During functional activity, Reliant on assistive device for balance Standing balance-Leahy Scale: Poor Standing balance comment: Reliant on RW, stooped over                           ADL either performed or assessed with clinical judgement   ADL Overall ADL's : Needs assistance/impaired Eating/Feeding: Set up;Sitting   Grooming: Set up;Sitting   Upper Body Bathing: Minimal assistance;Sitting   Lower Body  Bathing: Maximal assistance;Sitting/lateral leans;Sit to/from stand   Upper Body Dressing : Minimal assistance;Sitting   Lower Body Dressing: Maximal assistance;Sitting/lateral leans;Sit to/from stand   Toilet Transfer:  Moderate assistance;Stand-pivot   Toileting- Clothing Manipulation and Hygiene: Maximal assistance;Sitting/lateral lean;Sit to/from stand       Functional mobility during ADLs: Moderate assistance;Rolling walker (2 wheels) General ADL Comments: Limited by weakness, requiring clean up and linen change this sessin     Vision Baseline Vision/History: 1 Wears glasses Ability to See in Adequate Light: 0 Adequate Patient Visual Report: No change from baseline Vision Assessment?: No apparent visual deficits     Perception     Praxis      Pertinent Vitals/Pain Pain Assessment Pain Assessment: Faces Faces Pain Scale: Hurts little more Pain Location: stomach Pain Descriptors / Indicators: Aching, Discomfort Pain Intervention(s): Limited activity within patient's tolerance, Monitored during session, Repositioned     Hand Dominance Right   Extremity/Trunk Assessment Upper Extremity Assessment Upper Extremity Assessment: Generalized weakness   Lower Extremity Assessment Lower Extremity Assessment: Generalized weakness   Cervical / Trunk Assessment Cervical / Trunk Assessment: Normal   Communication Communication Communication: No difficulties   Cognition Arousal/Alertness: Awake/alert Behavior During Therapy: WFL for tasks assessed/performed Overall Cognitive Status: Within Functional Limits for tasks assessed                                       General Comments  VSS on 2L    Exercises     Shoulder Instructions      Home Living Family/patient expects to be discharged to:: Private residence Living Arrangements: Alone Available Help at Discharge: Family;Available PRN/intermittently Type of Home: House Home Access: Stairs to enter [employer] of Steps: 3 Entrance Stairs-Rails: Right;Left;Can reach both Home Layout: Two level;Bed/bath upstairs Alternate Level Stairs-Number of Steps: 11 Alternate Level Stairs-Rails: Right;Left;Can reach  both Bathroom Shower/Tub: Teacher, early years/pre: Standard Bathroom Accessibility: Yes How Accessible: Accessible via walker Home Equipment: Grab bars - toilet;Rolling Walker (2 wheels);Cane - single point;Shower seat   Additional Comments: ambulates with walking stick      Prior Functioning/Environment Prior Level of Function : Independent/Modified Independent             Mobility Comments: Community ambulator using walking stick, drives, does shopping ADLs Comments: Independent        OT Problem List: Decreased strength;Decreased activity tolerance;Impaired balance (sitting and/or standing);Decreased knowledge of use of DME or AE;Cardiopulmonary status limiting activity;Impaired UE functional use;Pain      OT Treatment/Interventions: Self-care/ADL training;Therapeutic exercise;Energy conservation;DME and/or AE instruction    OT Goals(Current goals can be found in the care plan section) Acute Rehab OT Goals Patient Stated Goal: To get better to go home OT Goal Formulation: With patient Time For Goal Achievement: 12/31/21 Potential to Achieve Goals: Good ADL Goals Pt Will Perform Grooming: with min guard assist;standing Pt Will Perform Lower Body Bathing: with min assist;sitting/lateral leans;sit to/from stand Pt Will Perform Lower Body Dressing: with min assist;sitting/lateral leans;sit to/from stand Pt Will Transfer to Toilet: with min assist;ambulating Pt Will Perform Toileting - Clothing Manipulation and hygiene: with min assist;sitting/lateral leans;sit to/from stand  OT Frequency: Min 2X/week    Co-evaluation              AM-PAC OT "6 Clicks" Daily Activity     Outcome Measure Help from another person eating meals?: A Little Help from another  person taking care of personal grooming?: A Little Help from another person toileting, which includes using toliet, bedpan, or urinal?: A Lot Help from another person bathing (including washing, rinsing,  drying)?: A Lot Help from another person to put on and taking off regular upper body clothing?: A Little Help from another person to put on and taking off regular lower body clothing?: A Lot 6 Click Score: 15   End of Session Equipment Utilized During Treatment: Rolling walker (2 wheels);Oxygen Nurse Communication: Mobility status  Activity Tolerance: Patient tolerated treatment well Patient left: in bed;with call bell/phone within reach  OT Visit Diagnosis: Unsteadiness on feet (R26.81);Other abnormalities of gait and mobility (R26.89);Muscle weakness (generalized) (M62.81)                Time: 7829-5621 OT Time Calculation (min): 26 min Charges:  OT General Charges $OT Visit: 1 Visit OT Evaluation $OT Eval Moderate Complexity: 1 Mod OT Treatments $Self Care/Home Management : 8-22 mins  Shacarra Choe H., OTR/L Acute Rehabilitation  Kandiss Ihrig Elane Ahtziri Jeffries 12/17/2021, 10:59 AM

## 2021-12-17 NOTE — Care Management Important Message (Signed)
Important Message  Patient Details  Name: [name] MRN: [id number] Date of Birth: 06-26-1950   Medicare Important Message Given:  Yes (spoke with at [phone] to review letter, no additional copy needed.)     Tommy Medal 12/17/2021, 11:41 AM

## 2021-12-18 DIAGNOSIS — K529 Noninfective gastroenteritis and colitis, unspecified: Secondary | ICD-10-CM | POA: Diagnosis not present

## 2021-12-18 LAB — BASIC METABOLIC PANEL
Anion gap: 3 — ABNORMAL LOW (ref 5–15)
BUN: 9 mg/dL (ref 8–23)
CO2: 20 mmol/L — ABNORMAL LOW (ref 22–32)
Calcium: 8.2 mg/dL — ABNORMAL LOW (ref 8.9–10.3)
Chloride: 111 mmol/L (ref 98–111)
Creatinine, Ser: 0.95 mg/dL (ref 0.61–1.24)
GFR, Estimated: >60 mL/min (ref >60)
Glucose, Bld: 105 mg/dL — ABNORMAL HIGH (ref 70–99)
Potassium: 4.2 mmol/L (ref 3.5–5.1)
Sodium: 134 mmol/L — ABNORMAL LOW (ref 135–145)

## 2021-12-18 LAB — CULTURE, BLOOD (ROUTINE X 2)
Culture: NO GROWTH
Culture: NO GROWTH
Special Requests: ADEQUATE

## 2021-12-18 MED ORDER — MAGIC MOUTHWASH
5.0000 mL | Freq: Four times a day (QID) | ORAL | Status: DC
  Administered 2021-12-18 – 2021-12-19 (×2): 5 mL via ORAL
  Filled 2021-12-18 (×2): qty 5

## 2021-12-18 NOTE — Progress Notes (Signed)
PROGRESS NOTE    [name]  AST:[id number] DOB: February 14, 1951 DOA: 12/13/2021 PCP: Rosalee Kaufman, PA-C   Brief Narrative:    [name]  is a 71 y.o. male, with past medical history of CAD, status post PCI, hypertension, chronic diastolic CHF, Sjogren's disease, kidney stones, patient presents to ED secondary to complaints of abdominal pain and diarrhea.  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  Assessment & Plan:   Principal Problem:   Colitis Active Problems:   Dyslipidemia, goal LDL below 70   CAD S/P percutaneous coronary angioplasty - PCI LAD in 2010, & 2016   OSA on CPAP   Essential hypertension   AKI (acute kidney injury) (HCC)   Hypokalemia   Hyponatremia   Chronic diastolic CHF (congestive heart failure) (HCC)   Diarrhea   Metabolic acidosis  Assessment and Plan:   Sepsis due to Colitis -Patient may criteria at time of admission: Tachycardia, leukocytosis, acute kidney injury and positive source of infection given presence of colitis. - Negative C. difficile PCR -GI panel positive for Shigella/E. Coli -Metronidazole has been discontinued and patient has completed IV antibiotics as recommended by GI service -Started on oral ciprofloxacin as recommended day 4/5. -No nausea or vomiting. -Still with ongoing diarrhea; but difficult to quantify per nursing reports and patient's symptoms -will d/c flexiseal  d stop IVF's repletion. -follow BMET in am and appreciate how many bowel movement he had; if stable and manageable amount, will discharge to SNF. -Continue as needed use of Bentyl for cramps.. -Continue to maintain adequate hydration and replete electrolytes as needed. -Outpatient colonoscopy once over acute illness recommended.   Metabolic acidosis -In the setting of diarrhea and dehydration -Continue to follow electrolytes -Continue to maintain adequate hydration and Continue supportive  care.   Diarrhea - In the setting of Shigella/E. coli colitis -Causing metabolic acidosis. -Continue to maintain adequate hydration and continue treatment with antibiotics as dictated by GI. -Will continue to avoid the use of antidiarrheal agents in order to allow toxin from this bacteria to get out of the body.   Chronic diastolic CHF (congestive heart failure) (HCC) -Appears to be compensated currently -Continue metoprolol. -Continue to maintain adequate hydration. -Continue to follow daily weights, strict intake and output and low-sodium diet.. -Continue outpatient follow-up with cardiology service.   Hyponatremia-improving -In the setting of dehydration and diuretic usage -Continue holding diuretics and maintain adequate hydration. -Follow electrolytes trend; sodium 130 currently.     AKI (acute kidney injury) ([facility]) -Setting of prerenal azotemia and dehydration from GI losses -Continue fluid resuscitation and follow renal function trend -Follow electrolytes and replete as needed. -Creatinine 1.04 today and his BUN 15; acute kidney process resolved.   Essential hypertension -Continue holding Imdur and Lasix in the setting of soft blood pressure and ongoing high chances for dehydration with ongoing diarrhea. -Continue metoprolol -Follow vital signs -Heart healthy diet discussed with patient.   OSA on CPAP -Continue CPAP nightly -Patient reports stable breathing.   CAD S/P percutaneous coronary angioplasty - PCI LAD in 2010, & 2016 -No chest pain or shortness of breath reported. -EKG/telemetry without acute ischemic changes -Continue statins and metoprolol. -Given soft blood pressure will continue to hold Imdur; given positive fecal occult blood test will hold aspirin as well for now. -Follow clinical response.   Dyslipidemia, goal LDL below 70 -Continue statins. -Heart healthy/low-fat diet discussed with patient.   DVT prophylaxis: Lovenox Code Status: Full Family  Communication: None at bedside Disposition Plan:  Status is: Inpatient Remains inpatient appropriate because: IV medications.   Nutritional Assessment:  The patient's BMI is: Body mass index is 32.69 kg/m.Marland Kitchen  Seen by dietician.  I agree with the assessment and plan as outlined below:  Nutrition Status: Nutrition Problem: Increased nutrient needs Etiology: acute illness Signs/Symptoms: estimated needs Interventions: Boost Breeze, MVI, Prostat  Consultants:  GI  Procedures:  None  Antimicrobials:  Anti-infectives (From admission, onward)    Start     Dose/Rate Route Frequency Ordered Stop   12/15/21 1100  ciprofloxacin (CIPRO) tablet 500 mg  Status:  Discontinued        500 mg Oral 2 times daily 12/15/21 1007 12/15/21 1026   12/15/21 1100  ciprofloxacin (CIPRO) tablet 500 mg        500 mg Oral 2 times daily 12/15/21 1008 12/20/21 0759   12/13/21 2015  cefTRIAXone (ROCEPHIN) 2 g in sodium chloride 0.9 % 100 mL IVPB        2 g 200 mL/hr over 30 Minutes Intravenous Every 24 hours 12/13/21 2000 12/15/21 2049   12/13/21 2000  metroNIDAZOLE (FLAGYL) IVPB 500 mg  Status:  Discontinued        500 mg 100 mL/hr over 60 Minutes Intravenous Every 12 hours 12/13/21 2000 12/15/21 1008         Subjective: Patient seen and evaluated today with ongoing explosive diarrhea, but he states that he is slowly improving.  Lower abdominal pain is improving.  Denies any nausea and vomiting and is tolerating diet.  Objective: Vitals:   12/17/21 0540 12/17/21 1254 12/17/21 2033 12/18/21 0316  BP: 104/68 (!) 102/52 (!) 127/57 120/63  Pulse: 72 65 62 (!) 58  Resp: '20 18 18 19  '$ Temp: 98.3 F (36.8 C) 98.2 F (36.8 C) 98.5 F (36.9 C) 97.9 F (36.6 C)  TempSrc:  Oral Oral Oral  SpO2: 99% 100% 100% 98%  Weight:      Height:        Intake/Output Summary (Last 24 hours) at 12/18/2021 1255 Last data filed at 12/18/2021 1017 Gross per 24 hour  Intake 966.35 ml  Output 2075 ml  Net -1108.65  ml   Filed Weights   12/13/21 1351  Weight: 97.5 kg    Examination:  General exam: Appears calm and comfortable  Respiratory system: Clear to auscultation. Respiratory effort normal. Cardiovascular system: S1 & S2 heard, RRR.  Gastrointestinal system: Abdomen is soft Central nervous system: Alert and awake Extremities: No edema Skin: No significant lesions noted Psychiatry: Flat affect.    Data Reviewed: I have personally reviewed following labs and imaging studies  CBC: Recent Labs  Lab 12/13/21 1413 12/14/21 0529 12/15/21 0423  WBC 11.4* 8.2 7.5  HGB 17.7* 15.5 14.9  HCT 50.3 43.5 42.3  MCV 91.3 90.8 93.2  PLT 263 236 938   Basic Metabolic Panel: Recent Labs  Lab 12/13/21 1413 12/13/21 1859 12/14/21 0529 12/15/21 0423 12/16/21 0542 12/18/21 0542  NA 125*  --  125* 131* 130* 134*  K 3.1*  --  3.0* 3.4* 3.6 4.2  CL 91*  --  96* 107 107 111  CO2 20*  --  17* 19* 19* 20*  GLUCOSE 156*  --  148* 119* 113* 105*  BUN 32*  --  34* '22 15 9  '$ CREATININE 2.26*  --  2.00* 1.21 1.04 0.95  CALCIUM 8.3*  --  7.9* 7.9* 7.5* 8.2*  MG  --  2.0  --  2.1  --   --    GFR: Estimated Creatinine Clearance: 81.9 mL/min (by C-G formula based on SCr of 0.95 mg/dL). Liver Function Tests: Recent Labs  Lab 12/13/21 1413  AST 24  ALT 22  ALKPHOS 62  BILITOT 1.9*  PROT 6.5  ALBUMIN 2.5*   Recent Labs  Lab 12/13/21 1413  LIPASE 24   No results for input(s): "AMMONIA" in the last 168 hours. Coagulation Profile: Recent Labs  Lab 12/13/21 1859  INR 1.2   Cardiac Enzymes: No results for input(s): "CKTOTAL", "CKMB", "CKMBINDEX", "TROPONINI" in the last 168 hours. BNP (last 3 results) No results for input(s): "PROBNP" in the last 8760 hours. HbA1C: No results for input(s): "HGBA1C" in the last 72 hours. CBG: No results for input(s): "GLUCAP" in the last 168 hours. Lipid Profile: No results for input(s): "CHOL", "HDL", "LDLCALC", "TRIG", "CHOLHDL", "LDLDIRECT" in the  last 72 hours. Thyroid Function Tests: No results for input(s): "TSH", "T4TOTAL", "FREET4", "T3FREE", "THYROIDAB" in the last 72 hours. Anemia Panel: No results for input(s): "VITAMINB12", "FOLATE", "FERRITIN", "TIBC", "IRON", "RETICCTPCT" in the last 72 hours. Sepsis Labs: Recent Labs  Lab 12/13/21 1859 12/13/21 2143 12/14/21 0530  LATICACIDVEN 2.7* 2.6* 1.2    Recent Results (from the past 240 hour(s))  Gastrointestinal Panel by PCR , Stool     Status: Abnormal   Collection Time: 12/13/21  2:17 PM   Specimen: Stool  Result Value Ref Range Status   Campylobacter species NOT DETECTED NOT DETECTED Final   Plesimonas shigelloides NOT DETECTED NOT DETECTED Final   Salmonella species NOT DETECTED NOT DETECTED Final   Yersinia enterocolitica NOT DETECTED NOT DETECTED Final   Vibrio species NOT DETECTED NOT DETECTED Final   Vibrio cholerae NOT DETECTED NOT DETECTED Final   Enteroaggregative E coli (EAEC) DETECTED (A) NOT DETECTED Final    Comment: RESULT CALLED TO, READ BACK BY AND VERIFIED WITH: NANCY JACKSON AT 1503 12/14/21.PMF    Enteropathogenic E coli (EPEC) NOT DETECTED NOT DETECTED Final   Enterotoxigenic E coli (ETEC) NOT DETECTED NOT DETECTED Final   Shiga like toxin producing E coli (STEC) NOT DETECTED NOT DETECTED Final   Shigella/Enteroinvasive E coli (EIEC) DETECTED (A) NOT DETECTED Final    Comment: RESULT CALLED TO, READ BACK BY AND VERIFIED WITH: NANCY JACKSON AT 1503 12/14/21.PMF    Cryptosporidium NOT DETECTED NOT DETECTED Final   Cyclospora cayetanensis NOT DETECTED NOT DETECTED Final   Entamoeba histolytica NOT DETECTED NOT DETECTED Final   Giardia lamblia NOT DETECTED NOT DETECTED Final   Adenovirus F40/41 NOT DETECTED NOT DETECTED Final   Astrovirus NOT DETECTED NOT DETECTED Final   Norovirus GI/GII NOT DETECTED NOT DETECTED Final   Rotavirus A NOT DETECTED NOT DETECTED Final   Sapovirus (I, II, IV, and V) NOT DETECTED NOT DETECTED Final    Comment: Performed  at [facility], [address] [place], [place] [address]  C Difficile Quick Screen w PCR reflex     Status: None   Collection Time: 12/13/21  4:19 PM   Specimen: Stool  Result Value Ref Range Status   C Diff antigen NEGATIVE NEGATIVE Final   C Diff toxin NEGATIVE NEGATIVE Final   C Diff interpretation No C. difficile detected.  Final    Comment: Performed at [facility], [address], [place], [place] [address]  Blood Culture (routine x 2)     Status: None   Collection Time: 12/13/21  6:49 PM   Specimen: Left Antecubital; Blood  Result  Value Ref Range Status   Specimen Description LEFT ANTECUBITAL  Final   Special Requests   Final    BOTTLES DRAWN AEROBIC ONLY Blood Culture adequate volume   Culture   Final    NO GROWTH 5 DAYS Performed at [facility], [address], [place], [place] [address]    Report Status 12/18/2021 FINAL  Final  Blood Culture (routine x 2)     Status: None   Collection Time: 12/13/21  6:59 PM   Specimen: BLOOD LEFT HAND  Result Value Ref Range Status   Specimen Description BLOOD LEFT HAND  Final   Special Requests   Final    BOTTLES DRAWN AEROBIC ONLY Blood Culture results may not be optimal due to an inadequate volume of blood received in culture bottles   Culture   Final    NO GROWTH 5 DAYS Performed at [facility], [address] [place], [place] [address]    Report Status 12/18/2021 FINAL  Final  Urine Culture     Status: None   Collection Time: 12/14/21  8:00 AM   Specimen: Urine, Catheterized  Result Value Ref Range Status   Specimen Description   Final    URINE, CATHETERIZED Performed at [facility], [address], [place], [place] [address]    Special Requests   Final    NONE Performed at [facility], [address] [place], [place] [address]    Culture   Final    NO GROWTH Performed at [facility], [place] [address] [place],  [address]    Report Status 12/15/2021 FINAL  Final         Radiology  Studies: No results found.      Scheduled Meds:  (feeding supplement) PROSource Plus  30 mL Oral TID BM   ascorbic acid  500 mg Oral q morning   B-complex with vitamin C  1 tablet Oral Daily   cholecalciferol  2,000 Units Oral Daily   ciprofloxacin  500 mg Oral BID   citalopram  20 mg Oral Daily   enoxaparin (LOVENOX) injection  40 mg Subcutaneous Q24H   feeding supplement  1 Container Oral TID BM   metoprolol succinate  50 mg Oral Daily   multivitamin with minerals  1 tablet Oral QPM   pantoprazole  40 mg Oral Daily   potassium chloride  20 mEq Oral Daily   topiramate  50 mg Oral QHS     LOS: 5 days    Time spent: 35 minutes    Bruno Leach Darleen Crocker, DO [facility]  If 7PM-7AM, please contact night-coverage [email or web address] 12/18/2021, 12:55 PM

## 2021-12-18 NOTE — Progress Notes (Signed)
PT Cancellation Note  Patient Details Name: [name] MRN: [id number] DOB: Aug 01, 1950   Cancelled Treatment:      Attempted PT session.  Pt with company in room and declined therapy today.  Stated he has been up all day walking around room.   Ihor Austin, LPTA/CLT; CBIS [phone]  Aldona Lento 12/18/2021, 5:31 PM

## 2021-12-18 NOTE — Progress Notes (Signed)
Patients tongue noted to be red with bumps and slightly swollen. States that it burns with everything he intakes. Notified Dr. Timoteo Gaul to make aware, new order in place for magic mouthwash.

## 2021-12-19 DIAGNOSIS — F410 Panic disorder [episodic paroxysmal anxiety] without agoraphobia: Secondary | ICD-10-CM | POA: Diagnosis not present

## 2021-12-19 DIAGNOSIS — M79676 Pain in unspecified toe(s): Secondary | ICD-10-CM | POA: Diagnosis not present

## 2021-12-19 DIAGNOSIS — E119 Type 2 diabetes mellitus without complications: Secondary | ICD-10-CM | POA: Diagnosis not present

## 2021-12-19 DIAGNOSIS — I10 Essential (primary) hypertension: Secondary | ICD-10-CM | POA: Diagnosis not present

## 2021-12-19 DIAGNOSIS — M3500 Sicca syndrome, unspecified: Secondary | ICD-10-CM | POA: Diagnosis not present

## 2021-12-19 DIAGNOSIS — E559 Vitamin D deficiency, unspecified: Secondary | ICD-10-CM | POA: Diagnosis not present

## 2021-12-19 DIAGNOSIS — A030 Shigellosis due to Shigella dysenteriae: Secondary | ICD-10-CM | POA: Diagnosis not present

## 2021-12-19 DIAGNOSIS — K529 Noninfective gastroenteritis and colitis, unspecified: Secondary | ICD-10-CM | POA: Diagnosis not present

## 2021-12-19 DIAGNOSIS — I5022 Chronic systolic (congestive) heart failure: Secondary | ICD-10-CM | POA: Diagnosis not present

## 2021-12-19 DIAGNOSIS — B370 Candidal stomatitis: Secondary | ICD-10-CM | POA: Diagnosis not present

## 2021-12-19 DIAGNOSIS — K5100 Ulcerative (chronic) pancolitis without complications: Secondary | ICD-10-CM | POA: Diagnosis not present

## 2021-12-19 DIAGNOSIS — I2510 Atherosclerotic heart disease of native coronary artery without angina pectoris: Secondary | ICD-10-CM | POA: Diagnosis not present

## 2021-12-19 DIAGNOSIS — N179 Acute kidney failure, unspecified: Secondary | ICD-10-CM | POA: Diagnosis not present

## 2021-12-19 DIAGNOSIS — K219 Gastro-esophageal reflux disease without esophagitis: Secondary | ICD-10-CM | POA: Diagnosis not present

## 2021-12-19 DIAGNOSIS — A6002 Herpesviral infection of other male genital organs: Secondary | ICD-10-CM | POA: Diagnosis not present

## 2021-12-19 DIAGNOSIS — E785 Hyperlipidemia, unspecified: Secondary | ICD-10-CM | POA: Diagnosis not present

## 2021-12-19 DIAGNOSIS — F419 Anxiety disorder, unspecified: Secondary | ICD-10-CM | POA: Diagnosis not present

## 2021-12-19 DIAGNOSIS — M6281 Muscle weakness (generalized): Secondary | ICD-10-CM | POA: Diagnosis not present

## 2021-12-19 DIAGNOSIS — G40909 Epilepsy, unspecified, not intractable, without status epilepticus: Secondary | ICD-10-CM | POA: Diagnosis not present

## 2021-12-19 DIAGNOSIS — F32A Depression, unspecified: Secondary | ICD-10-CM | POA: Diagnosis not present

## 2021-12-19 LAB — CBC
HCT: 48.4 % (ref 39.0–52.0)
Hemoglobin: 16.1 g/dL (ref 13.0–17.0)
MCH: 32.8 pg (ref 26.0–34.0)
MCHC: 33.3 g/dL (ref 30.0–36.0)
MCV: 98.6 fL (ref 80.0–100.0)
Platelets: 380 10*3/uL (ref 150–400)
RBC: 4.91 MIL/uL (ref 4.22–5.81)
RDW: 14.2 % (ref 11.5–15.5)
WBC: 11 10*3/uL — ABNORMAL HIGH (ref 4.0–10.5)
nRBC: 0 % (ref 0.0–0.2)

## 2021-12-19 LAB — BASIC METABOLIC PANEL
Anion gap: 6 (ref 5–15)
BUN: 8 mg/dL (ref 8–23)
CO2: 17 mmol/L — ABNORMAL LOW (ref 22–32)
Calcium: 8.3 mg/dL — ABNORMAL LOW (ref 8.9–10.3)
Chloride: 112 mmol/L — ABNORMAL HIGH (ref 98–111)
Creatinine, Ser: 0.93 mg/dL (ref 0.61–1.24)
GFR, Estimated: >60 mL/min (ref >60)
Glucose, Bld: 99 mg/dL (ref 70–99)
Potassium: 4 mmol/L (ref 3.5–5.1)
Sodium: 135 mmol/L (ref 135–145)

## 2021-12-19 LAB — MAGNESIUM: Magnesium: 2.1 mg/dL (ref 1.7–2.4)

## 2021-12-19 MED ORDER — CLONAZEPAM 0.5 MG PO TABS: 0.5000 mg | ORAL_TABLET | ORAL | 0 refills | Status: AC

## 2021-12-19 MED ORDER — CIPROFLOXACIN HCL 500 MG PO TABS: 500.0000 mg | ORAL_TABLET | Freq: Two times a day (BID) | ORAL | 0 refills | Status: AC

## 2021-12-19 MED ORDER — SACCHAROMYCES BOULARDII 250 MG PO CAPS: 250.0000 mg | ORAL_CAPSULE | Freq: Two times a day (BID) | ORAL | 0 refills | Status: AC

## 2021-12-19 MED ORDER — MAGIC MOUTHWASH: 5.0000 mL | Freq: Four times a day (QID) | ORAL | 0 refills | Status: AC

## 2021-12-19 MED ORDER — BUTALBITAL-APAP-CAFFEINE 50-325-40 MG PO CAPS: 1.0000 | ORAL_CAPSULE | ORAL | 0 refills | Status: AC | PRN

## 2021-12-19 MED ORDER — HYDROMORPHONE HCL 2 MG PO TABS: 2.0000 mg | ORAL_TABLET | ORAL | 0 refills | Status: DC | PRN

## 2021-12-19 NOTE — Discharge Summary (Signed)
Physician Discharge Summary  [name] ZWC:[id number] DOB: Sep 23, 1950 DOA: 12/13/2021  PCP: Rosalee Kaufman, PA-C  Admit date: 12/13/2021  Discharge date: 12/19/2021  Admitted From:Home  Disposition:  SNF  Recommendations for Outpatient Follow-up:  Follow up with PCP in 1-2 weeks Continue ciprofloxacin as prescribed below to complete course of treatment for shigellosis Continue on probiotics as noted below for 1 month Continue other home medications as prior  Home Health: None  Equipment/Devices: None  Discharge Condition:Stable  CODE STATUS: Full  Diet recommendation: Heart Healthy  Brief/Interim Summary: [name]  is a 71 y.o. male, with past medical history of CAD, status post PCI, hypertension, chronic diastolic CHF, Sjogren's disease, kidney stones, patient presents to ED secondary to complaints of abdominal pain and diarrhea.  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  His condition gradually improved over the course of several days.  He was initially started on IV antibiotics and was switched to oral ciprofloxacin and completed 4 days of treatment with dramatic improvements in clinical condition.  He will require 1 more day of ciprofloxacin to finish the course of treatment and he was seen by GI with recommendations to start on probiotics for 1 month afterwards and this has been prescribed as well.  No other acute events noted throughout the course of the stay.  Discharge Diagnoses:  Principal Problem:   Colitis Active Problems:   Dyslipidemia, goal LDL below 70   CAD S/P percutaneous coronary angioplasty - PCI LAD in 2010, & 2016   OSA on CPAP   Essential hypertension   AKI (acute kidney injury) (HCC)   Hypokalemia   Hyponatremia   Chronic diastolic CHF (congestive heart failure) (HCC)   Diarrhea   Metabolic acidosis  Principal discharge diagnosis: Sepsis, present on admission secondary to  colitis related to Shigella E. coli with associated AKI.  Discharge Instructions  Discharge Instructions     Diet - low sodium heart healthy   Complete by: As directed    Increase activity slowly   Complete by: As directed       Allergies as of 12/19/2021       Reactions   Bee Venom Anaphylaxis   Duloxetine Swelling   CHF   Fentanyl Other (See Comments)   Confusion, panic attacks with the patch,  Fentanyl injected is ok   Lidocaine Other (See Comments)   REACTION: non-reactive   Meperidine Hcl Other (See Comments)   REACTION: causes extreme mental reactions.   Methadone Other (See Comments)   REACTION: causes heart failure   Oxycodone Hcl Other (See Comments)   REACTION: causes heart failure   Pregabalin Other (See Comments)   REACTION: chf   Zolpidem Other (See Comments)   REACTION: confusion REACTION: confusion REACTION: confusion   Zolpidem Tartrate Other (See Comments)   REACTION: confusion        Medication List     TAKE these medications    ascorbic acid 500 MG tablet Commonly known as: VITAMIN C Take 500 mg by mouth every morning.   aspirin EC 81 MG tablet Take 81 mg by mouth at bedtime.   atorvastatin 40 MG tablet Commonly known as: LIPITOR TAKE 1 TABLET IN THE EVENING   b complex vitamins tablet Take 1 tablet by mouth daily.   betamethasone valerate ointment 0.1 % Commonly known as: VALISONE Apply 1 application topically 2 (two) times daily as needed (itching).   Butalbital-APAP-Caffeine 50-325-40 MG capsule Take 1-2  capsules by mouth as needed for headache.   ciprofloxacin 500 MG tablet Commonly known as: CIPRO Take 1 tablet (500 mg total) by mouth 2 (two) times daily for 1 day.   citalopram 20 MG tablet Commonly known as: CELEXA Take 20 mg by mouth daily.   clonazePAM 0.5 MG tablet Commonly known as: KLONOPIN Take 1 tablet (0.5 mg total) by mouth See admin instructions. Take 1 or 2 tablets as needed for panick attacks   COENZYME  Q10 PO Take by mouth.   diphenhydrAMINE 25 MG tablet Commonly known as: BENADRYL Take 25-50 mg by mouth 2 (two) times daily as needed for itching.   fluticasone 0.05 % cream Commonly known as: CUTIVATE Apply 1 application topically 2 (two) times daily as needed (rash). Applied to face and/or penis area(s)   furosemide 40 MG tablet Commonly known as: LASIX Take 80 mg by mouth daily.   guaifenesin 400 MG Tabs tablet Commonly known as: HUMIBID E Take 800 mg by mouth daily as needed (for post nasal drip/cough).   HYDROmorphone 2 MG tablet Commonly known as: DILAUDID Take 1 tablet (2 mg total) by mouth every 3 (three) hours as needed for severe pain.   ibuprofen 200 MG tablet Commonly known as: ADVIL Take 600 mg by mouth 4 (four) times daily as needed for mild pain or moderate pain.   isosorbide mononitrate 120 MG 24 hr tablet Commonly known as: IMDUR TAKE 1 TABLET EVERY DAY What changed:  how to take this when to take this additional instructions   L-Lysine 500 MG Tabs Take 500 mg by mouth daily.   loperamide 2 MG tablet Commonly known as: IMODIUM A-D Take 6 mg by mouth 4 (four) times daily as needed for diarrhea or loose stools.   magic mouthwash Soln Take 5 mLs by mouth 4 (four) times daily for 7 days.   metoprolol succinate 50 MG 24 hr tablet Commonly known as: TOPROL-XL Take 1 tablet (50 mg total) by mouth daily. Take with or immediately following a meal.   multivitamin with minerals tablet Take 1 tablet by mouth every evening.   naproxen sodium 220 MG tablet Commonly known as: ALEVE Take 220 mg by mouth.   nitroGLYCERIN 0.4 MG SL tablet Commonly known as: NITROSTAT Take one every 5 minutes as needed for chest pain   NON FORMULARY at bedtime. CPAP   omeprazole 40 MG capsule Commonly known as: PRILOSEC Take 40 mg by mouth daily before breakfast.   polyvinyl alcohol-povidone 1.4-0.6 % ophthalmic solution Commonly known as: HYPOTEARS Place 1-2 drops  into both eyes daily as needed (dry eyes). For dry eyes   QC TUMERIC COMPLEX PO Take by mouth.   saccharomyces boulardii 250 MG capsule Commonly known as: FLORASTOR Take 1 capsule (250 mg total) by mouth 2 (two) times daily.   testosterone cypionate 100 MG/ML injection Commonly known as: DEPOTESTOTERONE CYPIONATE Inject 100 mg into the muscle every 7 (seven) days.   topiramate 25 MG tablet Commonly known as: TOPAMAX Take 50 mg by mouth at bedtime.   Vitamin D 50 MCG (2000 UT) tablet Take 2,000 Units by mouth daily.        Contact information for follow-up providers     Rosalee Kaufman, PA-C. Schedule an appointment as soon as possible for a visit in 1 week(s).   Specialty: Physician Assistant Contact information: [place] [address] [phone]              Contact information for  after-discharge care     [facility] .   Service: Skilled Nursing Contact information: [place] [address] [phone]                    Allergies  Allergen Reactions   Bee Venom Anaphylaxis   Duloxetine Swelling    CHF   Fentanyl Other (See Comments)    Confusion, panic attacks with the patch,  Fentanyl injected is ok   Lidocaine Other (See Comments)    REACTION: non-reactive   Meperidine Hcl Other (See Comments)    REACTION: causes extreme mental reactions.   Methadone Other (See Comments)    REACTION: causes heart failure   Oxycodone Hcl Other (See Comments)    REACTION: causes heart failure   Pregabalin Other (See Comments)    REACTION: chf   Zolpidem Other (See Comments)    REACTION: confusion REACTION: confusion REACTION: confusion   Zolpidem Tartrate Other (See Comments)    REACTION: confusion    Consultations: GI   Procedures/Studies: DG Chest Port 1 View  Result Date: 12/13/2021 CLINICAL DATA:  Abdominal pain, loose stools for 1 week EXAM: PORTABLE CHEST  1 VIEW COMPARISON:  12/06/2021 FINDINGS: Single frontal view of the chest demonstrates an unremarkable cardiac silhouette. No acute airspace disease, effusion, or pneumothorax. No acute bony abnormality. IMPRESSION: 1. No acute intrathoracic process. Electronically Signed   By: Randa Ngo M.D.   On: 12/13/2021 17:59   CT Abdomen Pelvis Wo Contrast  Result Date: 12/13/2021 CLINICAL DATA:  Abdominal pain, acute, nonlocalized EXAM: CT ABDOMEN AND PELVIS WITHOUT CONTRAST TECHNIQUE: Multidetector CT imaging of the abdomen and pelvis was performed following the standard protocol without IV contrast. RADIATION DOSE REDUCTION: This exam was performed according to the departmental dose-optimization program which includes automated exposure control, adjustment of the mA and/or kV according to patient size and/or use of iterative reconstruction technique. COMPARISON:  None Available. FINDINGS: Lower chest: No acute findings Hepatobiliary: No focal hepatic abnormality. Gallbladder unremarkable. Pancreas: No focal abnormality or ductal dilatation. Spleen: No focal abnormality.  Normal size. Adrenals/Urinary Tract: No adrenal abnormality. No focal renal abnormality. No stones or hydronephrosis. Urinary bladder is unremarkable. Stomach/Bowel: There is diffuse colonic wall thickening and stranding around the colon compatible with pancolitis. Stomach and small bowel decompressed, unremarkable. No bowel obstruction. Vascular/Lymphatic: Aortic atherosclerosis. No evidence of aneurysm or adenopathy. Reproductive: No visible focal abnormality. Other: No free fluid or free air. Musculoskeletal: No acute bony abnormality. IMPRESSION: Diffuse colonic wall thickening and surrounding stranding compatible with pancolitis. Electronically Signed   By: Rolm Baptise M.D.   On: 12/13/2021 17:21     Discharge Exam: Vitals:   12/18/21 2006 12/19/21 0526  BP: (!) 124/59 100/67  Pulse: 61 72  Resp: 18 14  Temp: 98.2 F (36.8 C) 98.8  F (37.1 C)  SpO2: 98% 99%   Vitals:   12/18/21 0316 12/18/21 1401 12/18/21 2006 12/19/21 0526  BP: 120/63 124/68 (!) 124/59 100/67  Pulse: (!) 58 62 61 72  Resp: '19 18 18 14  '$ Temp: 97.9 F (36.6 C) 98.1 F (36.7 C) 98.2 F (36.8 C) 98.8 F (37.1 C)  TempSrc: Oral Oral Oral   SpO2: 98% 99% 98% 99%  Weight:      Height:        General: Pt is alert, awake, not in acute distress Cardiovascular: RRR, S1/S2 +, no rubs, no gallops Respiratory: CTA bilaterally, no wheezing,  no rhonchi Abdominal: Soft, NT, ND, bowel sounds + Extremities: no edema, no cyanosis    The results of significant diagnostics from this hospitalization (including imaging, microbiology, ancillary and laboratory) are listed below for reference.     Microbiology: Recent Results (from the past 240 hour(s))  Gastrointestinal Panel by PCR , Stool     Status: Abnormal   Collection Time: 12/13/21  2:17 PM   Specimen: Stool  Result Value Ref Range Status   Campylobacter species NOT DETECTED NOT DETECTED Final   Plesimonas shigelloides NOT DETECTED NOT DETECTED Final   Salmonella species NOT DETECTED NOT DETECTED Final   Yersinia enterocolitica NOT DETECTED NOT DETECTED Final   Vibrio species NOT DETECTED NOT DETECTED Final   Vibrio cholerae NOT DETECTED NOT DETECTED Final   Enteroaggregative E coli (EAEC) DETECTED (A) NOT DETECTED Final    Comment: RESULT CALLED TO, READ BACK BY AND VERIFIED WITH: NANCY JACKSON AT 1503 12/14/21.PMF    Enteropathogenic E coli (EPEC) NOT DETECTED NOT DETECTED Final   Enterotoxigenic E coli (ETEC) NOT DETECTED NOT DETECTED Final   Shiga like toxin producing E coli (STEC) NOT DETECTED NOT DETECTED Final   Shigella/Enteroinvasive E coli (EIEC) DETECTED (A) NOT DETECTED Final    Comment: RESULT CALLED TO, READ BACK BY AND VERIFIED WITH: NANCY JACKSON AT 1503 12/14/21.PMF    Cryptosporidium NOT DETECTED NOT DETECTED Final   Cyclospora cayetanensis NOT DETECTED NOT DETECTED Final    Entamoeba histolytica NOT DETECTED NOT DETECTED Final   Giardia lamblia NOT DETECTED NOT DETECTED Final   Adenovirus F40/41 NOT DETECTED NOT DETECTED Final   Astrovirus NOT DETECTED NOT DETECTED Final   Norovirus GI/GII NOT DETECTED NOT DETECTED Final   Rotavirus A NOT DETECTED NOT DETECTED Final   Sapovirus (I, II, IV, and V) NOT DETECTED NOT DETECTED Final    Comment: Performed at [facility], [address], [place], [place] [address]  C Difficile Quick Screen w PCR reflex     Status: None   Collection Time: 12/13/21  4:19 PM   Specimen: Stool  Result Value Ref Range Status   C Diff antigen NEGATIVE NEGATIVE Final   C Diff toxin NEGATIVE NEGATIVE Final   C Diff interpretation No C. difficile detected.  Final    Comment: Performed at [facility], [address], [place], [place] [address]  Blood Culture (routine x 2)     Status: None   Collection Time: 12/13/21  6:49 PM   Specimen: Left Antecubital; Blood  Result Value Ref Range Status   Specimen Description LEFT ANTECUBITAL  Final   Special Requests   Final    BOTTLES DRAWN AEROBIC ONLY Blood Culture adequate volume   Culture   Final    NO GROWTH 5 DAYS Performed at [facility], [address], [place], [place] [address]    Report Status 12/18/2021 FINAL  Final  Blood Culture (routine x 2)     Status: None   Collection Time: 12/13/21  6:59 PM   Specimen: BLOOD LEFT HAND  Result Value Ref Range Status   Specimen Description BLOOD LEFT HAND  Final   Special Requests   Final    BOTTLES DRAWN AEROBIC ONLY Blood Culture results may not be optimal due to an inadequate volume of blood received in culture bottles   Culture   Final    NO GROWTH 5 DAYS Performed at [facility], [address], [place], [place] [address]    Report Status 12/18/2021 FINAL  Final  Urine Culture     Status: None   Collection Time: 12/14/21  8:00 AM   Specimen: Urine, Catheterized  Result Value Ref Range Status   Specimen Description    Final    URINE, CATHETERIZED Performed at [facility], [address] [place], [place] [address]    Special Requests   Final    NONE Performed at [facility], [address], [place], [place] [address]    Culture   Final    NO GROWTH Performed at [facility], [place] [address], [place], [place] [address]    Report Status 12/15/2021 FINAL  Final     Labs: BNP (last 3 results) No results for input(s): "BNP" in the last 8760 hours. Basic Metabolic Panel: Recent Labs  Lab 12/13/21 1859 12/14/21 0529 12/15/21 0423 12/16/21 0542 12/18/21 0542 12/19/21 0543  NA  --  125* 131* 130* 134* 135  K  --  3.0* 3.4* 3.6 4.2 4.0  CL  --  96* 107 107 111 112*  CO2  --  17* 19* 19* 20* 17*  GLUCOSE  --  148* 119* 113* 105* 99  BUN  --  34* '22 15 9 8  '$ CREATININE  --  2.00* 1.21 1.04 0.95 0.93  CALCIUM  --  7.9* 7.9* 7.5* 8.2* 8.3*  MG 2.0  --  2.1  --   --  2.1   Liver Function Tests: Recent Labs  Lab 12/13/21 1413  AST 24  ALT 22  ALKPHOS 62  BILITOT 1.9*  PROT 6.5  ALBUMIN 2.5*   Recent Labs  Lab 12/13/21 1413  LIPASE 24   No results for input(s): "AMMONIA" in the last 168 hours. CBC: Recent Labs  Lab 12/13/21 1413 12/14/21 0529 12/15/21 0423 12/19/21 0543  WBC 11.4* 8.2 7.5 11.0*  HGB 17.7* 15.5 14.9 16.1  HCT 50.3 43.5 42.3 48.4  MCV 91.3 90.8 93.2 98.6  PLT 263 236 255 380   Cardiac Enzymes: No results for input(s): "CKTOTAL", "CKMB", "CKMBINDEX", "TROPONINI" in the last 168 hours. BNP: Invalid input(s): "POCBNP" CBG: No results for input(s): "GLUCAP" in the last 168 hours. D-Dimer No results for input(s): "DDIMER" in the last 72 hours. Hgb A1c No results for input(s): "HGBA1C" in the last 72 hours. Lipid Profile No results for input(s): "CHOL", "HDL", "LDLCALC", "TRIG", "CHOLHDL", "LDLDIRECT" in the last 72 hours. Thyroid function studies No results for input(s): "TSH", "T4TOTAL", "T3FREE", "THYROIDAB" in the last 72 hours.  Invalid input(s):  "FREET3" Anemia work up No results for input(s): "VITAMINB12", "FOLATE", "FERRITIN", "TIBC", "IRON", "RETICCTPCT" in the last 72 hours. Urinalysis    Component Value Date/Time   COLORURINE YELLOW 12/14/2021 0800   APPEARANCEUR HAZY (A) 12/14/2021 0800   LABSPEC 1.011 12/14/2021 0800   PHURINE 6.0 12/14/2021 0800   GLUCOSEU NEGATIVE 12/14/2021 0800   HGBUR NEGATIVE 12/14/2021 0800   BILIRUBINUR NEGATIVE 12/14/2021 0800   KETONESUR 5 (A) 12/14/2021 0800   PROTEINUR NEGATIVE 12/14/2021 0800   UROBILINOGEN 1.0 07/30/2011 2052   NITRITE NEGATIVE 12/14/2021 0800   LEUKOCYTESUR NEGATIVE 12/14/2021 0800   Sepsis Labs Recent Labs  Lab 12/13/21 1413 12/14/21 0529 12/15/21 0423 12/19/21 0543  WBC 11.4* 8.2 7.5 11.0*   Microbiology Recent Results (from the past 240 hour(s))  Gastrointestinal Panel by PCR , Stool     Status: Abnormal   Collection Time: 12/13/21  2:17 PM   Specimen: Stool  Result Value Ref Range Status   Campylobacter species NOT DETECTED NOT DETECTED Final  Plesimonas shigelloides NOT DETECTED NOT DETECTED Final   Salmonella species NOT DETECTED NOT DETECTED Final   Yersinia enterocolitica NOT DETECTED NOT DETECTED Final   Vibrio species NOT DETECTED NOT DETECTED Final   Vibrio cholerae NOT DETECTED NOT DETECTED Final   Enteroaggregative E coli (EAEC) DETECTED (A) NOT DETECTED Final    Comment: RESULT CALLED TO, READ BACK BY AND VERIFIED WITH: NANCY JACKSON AT 1503 12/14/21.PMF    Enteropathogenic E coli (EPEC) NOT DETECTED NOT DETECTED Final   Enterotoxigenic E coli (ETEC) NOT DETECTED NOT DETECTED Final   Shiga like toxin producing E coli (STEC) NOT DETECTED NOT DETECTED Final   Shigella/Enteroinvasive E coli (EIEC) DETECTED (A) NOT DETECTED Final    Comment: RESULT CALLED TO, READ BACK BY AND VERIFIED WITH: NANCY JACKSON AT 1503 12/14/21.PMF    Cryptosporidium NOT DETECTED NOT DETECTED Final   Cyclospora cayetanensis NOT DETECTED NOT DETECTED Final   Entamoeba  histolytica NOT DETECTED NOT DETECTED Final   Giardia lamblia NOT DETECTED NOT DETECTED Final   Adenovirus F40/41 NOT DETECTED NOT DETECTED Final   Astrovirus NOT DETECTED NOT DETECTED Final   Norovirus GI/GII NOT DETECTED NOT DETECTED Final   Rotavirus A NOT DETECTED NOT DETECTED Final   Sapovirus (I, II, IV, and V) NOT DETECTED NOT DETECTED Final    Comment: Performed at [facility], [address], [place], [place] [address]  C Difficile Quick Screen w PCR reflex     Status: None   Collection Time: 12/13/21  4:19 PM   Specimen: Stool  Result Value Ref Range Status   C Diff antigen NEGATIVE NEGATIVE Final   C Diff toxin NEGATIVE NEGATIVE Final   C Diff interpretation No C. difficile detected.  Final    Comment: Performed at [facility], [address], [place], [place] [address]  Blood Culture (routine x 2)     Status: None   Collection Time: 12/13/21  6:49 PM   Specimen: Left Antecubital; Blood  Result Value Ref Range Status   Specimen Description LEFT ANTECUBITAL  Final   Special Requests   Final    BOTTLES DRAWN AEROBIC ONLY Blood Culture adequate volume   Culture   Final    NO GROWTH 5 DAYS Performed at [facility], [address], [place], [place] [address]    Report Status 12/18/2021 FINAL  Final  Blood Culture (routine x 2)     Status: None   Collection Time: 12/13/21  6:59 PM   Specimen: BLOOD LEFT HAND  Result Value Ref Range Status   Specimen Description BLOOD LEFT HAND  Final   Special Requests   Final    BOTTLES DRAWN AEROBIC ONLY Blood Culture results may not be optimal due to an inadequate volume of blood received in culture bottles   Culture   Final    NO GROWTH 5 DAYS Performed at [facility], [address] [place], [place] [address]    Report Status 12/18/2021 FINAL  Final  Urine Culture     Status: None   Collection Time: 12/14/21  8:00 AM   Specimen: Urine, Catheterized  Result Value Ref Range Status   Specimen Description   Final     URINE, CATHETERIZED Performed at [facility], [address], [place], [place] [address]    Special Requests   Final    NONE Performed at [facility], [address] [place], [place] [address]    Culture   Final    NO GROWTH Performed at [facility], [place] [address], [place], [place] [address]    Report Status 12/15/2021 FINAL  Final     Time coordinating discharge: 35 minutes  SIGNED:   Rodena Goldmann, DO [facility] 12/19/2021, 10:26 AM  If 7PM-7AM, please contact night-coverage [email or web address]

## 2021-12-19 NOTE — TOC Transition Note (Addendum)
Transition of Care [facility]) - CM/SW Discharge Note   Patient Details  Name: [name] MRN: [id number] Date of Birth: [birth year]  Transition of Care [facility]) CM/SW Contact:  Boneta Lucks, RN Phone Number: 12/19/2021, 10:15 AM   Clinical Narrative:   Patient is medically ready to discharge. Grand daughter [name])  is calling, she touring an accepted a bed offer at [facility], She spoke with patient and he is agreeable and want to go there. TOC confirmed bed offer with Amber at [facility], she will admit him today and will update the INS Auth. TOC also updated [facility] that patient would be going to another facility. RN to call report. Med necessity printed. TOC will call EMS when RN is ready.    Addendum : [name] wants to transport patient to [place] [facility] confirmed with [employer]. RN updated okay to discharge.   Final next level of care: Skilled Nursing Facility Barriers to Discharge: Barriers Resolved   Patient Goals and CMS Choice Patient states their goals for this hospitalization and ongoing recovery are:: agreeable to SNF CMS Medicare.gov Compare Post Acute Care list provided to:: Patient Represenative (must comment) Choice offered to / list presented to : Patient  Discharge Placement              Patient chooses bed at:  [facility]) Patient to be transferred to facility by: EMS [name] Patient and family notified of of transfer: 12/19/21  Discharge Plan and Services In-house Referral: Clinical Social Work          Readmission Risk Interventions    12/19/2021   10:14 AM 12/16/2021   10:38 AM  Readmission Risk Prevention Plan  Post Dischage Appt Complete Complete  Medication Screening  Complete  Transportation Screening  Complete

## 2021-12-19 NOTE — Progress Notes (Signed)
Patient has refused the use of his CPAP unit but unit is in his room.

## 2021-12-19 NOTE — Progress Notes (Signed)
Physical Therapy Treatment Patient Details Name: [name] MRN: [id number] DOB: 01-12-1951 Today's Date: 12/19/2021   History of Present Illness [name]  is a 71 y.o. male, with past medical history of CAD, status post PCI, hypertension, chronic diastolic CHF, Sjogren's disease, kidney stones, patient presents to ED secondary to complaints of abdominal pain and diarrhea, patient reports symptoms has been going on for last 10 days, started last Wednesday, started to have nausea, vomiting, diarrhea, fever and chills, initial improvement, but then symptoms has worsened, with worsening abdominal pain and diarrhea, as well he does report red stools with his diarrhea, no coffee-ground emesis, will intake and appetite, and had [facility] ED visit 7/28, where he was discharged with low blood pressure of 95 systolic, x-ray with no acute findings, patient had colonoscopy in 2022, due for work-up of screening test concerning for malignancy, but had poor bowel prep.    PT Comments    Pt friendly and willing to participate with therapy today.  Presents with increased ease with bed mobility, mod I transfers and safe mechanics with gait.  Utilized RW to address unsteadiness upon standing.  Able to increased distance with no LOB episodes, just slow labored movements.  EOS pt requested to return to sitting on EOB.  RN in room at [place].  Call bell within reach.    Recommendations for follow up therapy are one component of a multi-disciplinary discharge planning process, led by the attending physician.  Recommendations may be updated based on patient status, additional functional criteria and insurance authorization.  Follow Up Recommendations        Assistance Recommended at Discharge    Patient can return home with the following     Equipment Recommendations       Recommendations for Other Services       Precautions / Restrictions Precautions Precautions: Fall Restrictions Weight Bearing  Restrictions: No     Mobility  Bed Mobility Overal bed mobility: Modified Independent                  Transfers Overall transfer level: Modified independent Equipment used: Rolling walker (2 wheels) Transfers: Sit to/from Stand Sit to Stand: Supervision           General transfer comment: cueing for hand mechanics to increased ease with STS    Ambulation/Gait Ambulation/Gait assistance: Min assist, Supervision Gait Distance (Feet): 120 Feet Assistive device: Rolling walker (2 wheels) Gait Pattern/deviations: Decreased step length - right, Decreased step length - left, Decreased stride length       General Gait Details: slow labored mechanics, safe mechanics with RW, mainly limited by fatigue following gait   Stairs             Wheelchair Mobility    Modified Rankin (Stroke Patients Only)       Balance                                            Cognition Arousal/Alertness: Awake/alert Behavior During Therapy: WFL for tasks assessed/performed Overall Cognitive Status: Within Functional Limits for tasks assessed                                          Exercises      General Comments  Pertinent Vitals/Pain Pain Assessment Pain Assessment: No/denies pain    Home Living                          Prior Function            PT Goals (current goals can now be found in the care plan section)      Frequency           PT Plan Current plan remains appropriate    Co-evaluation              AM-PAC PT "6 Clicks" Mobility   Outcome Measure  Help needed turning from your back to your side while in a flat bed without using bedrails?: None Help needed moving from lying on your back to sitting on the side of a flat bed without using bedrails?: None Help needed moving to and from a bed to a chair (including a wheelchair)?: None Help needed standing up from a chair using your arms  (e.g., wheelchair or bedside chair)?: A Little Help needed to walk in hospital room?: A Little Help needed climbing 3-5 steps with a railing? : A Lot 6 Click Score: 20    End of Session Equipment Utilized During Treatment: Gait belt (SpO2 99% room air) Activity Tolerance: Patient tolerated treatment well;Patient limited by fatigue Patient left: in bed;with call bell/phone within reach;with nursing/sitter in room (sitting on EOB per pt request) Nurse Communication: Mobility status PT Visit Diagnosis: Unsteadiness on feet (R26.81);Other abnormalities of gait and mobility (R26.89);Muscle weakness (generalized) (M62.81)     Time: 8588-5027 PT Time Calculation (min) (ACUTE ONLY): 17 min  Charges:  $Therapeutic Exercise: 8-22 mins                    Ihor Austin, LPTA/CLT; CBIS [phone]  Aldona Lento 12/19/2021, 11:29 AM

## 2021-12-19 NOTE — Care Management Important Message (Signed)
Important Message  Patient Details  Name: [name] MRN: [id number] Date of Birth: March 10, 1951   Medicare Important Message Given:  Other (see comment) (spoke with patient on 8/8, no additonal copy needed)     Tommy Medal 12/19/2021, 12:03 PM

## 2021-12-20 DIAGNOSIS — N179 Acute kidney failure, unspecified: Secondary | ICD-10-CM | POA: Diagnosis not present

## 2021-12-20 DIAGNOSIS — K219 Gastro-esophageal reflux disease without esophagitis: Secondary | ICD-10-CM | POA: Diagnosis not present

## 2021-12-20 DIAGNOSIS — E785 Hyperlipidemia, unspecified: Secondary | ICD-10-CM | POA: Diagnosis not present

## 2021-12-20 DIAGNOSIS — A6002 Herpesviral infection of other male genital organs: Secondary | ICD-10-CM | POA: Diagnosis not present

## 2021-12-20 DIAGNOSIS — B370 Candidal stomatitis: Secondary | ICD-10-CM | POA: Diagnosis not present

## 2021-12-20 DIAGNOSIS — G40909 Epilepsy, unspecified, not intractable, without status epilepticus: Secondary | ICD-10-CM | POA: Diagnosis not present

## 2021-12-20 DIAGNOSIS — I5022 Chronic systolic (congestive) heart failure: Secondary | ICD-10-CM | POA: Diagnosis not present

## 2021-12-20 DIAGNOSIS — I10 Essential (primary) hypertension: Secondary | ICD-10-CM | POA: Diagnosis not present

## 2021-12-20 DIAGNOSIS — I2510 Atherosclerotic heart disease of native coronary artery without angina pectoris: Secondary | ICD-10-CM | POA: Diagnosis not present

## 2021-12-24 DIAGNOSIS — F419 Anxiety disorder, unspecified: Secondary | ICD-10-CM | POA: Diagnosis not present

## 2021-12-25 DIAGNOSIS — I10 Essential (primary) hypertension: Secondary | ICD-10-CM | POA: Diagnosis not present

## 2021-12-25 DIAGNOSIS — A030 Shigellosis due to Shigella dysenteriae: Secondary | ICD-10-CM | POA: Diagnosis not present

## 2021-12-25 DIAGNOSIS — F32A Depression, unspecified: Secondary | ICD-10-CM | POA: Diagnosis not present

## 2021-12-25 DIAGNOSIS — F419 Anxiety disorder, unspecified: Secondary | ICD-10-CM | POA: Diagnosis not present

## 2021-12-27 DIAGNOSIS — M79676 Pain in unspecified toe(s): Secondary | ICD-10-CM | POA: Diagnosis not present

## 2022-01-02 DIAGNOSIS — I5022 Chronic systolic (congestive) heart failure: Secondary | ICD-10-CM | POA: Diagnosis not present

## 2022-01-02 DIAGNOSIS — G40909 Epilepsy, unspecified, not intractable, without status epilepticus: Secondary | ICD-10-CM | POA: Diagnosis not present

## 2022-01-02 DIAGNOSIS — K5100 Ulcerative (chronic) pancolitis without complications: Secondary | ICD-10-CM | POA: Diagnosis not present

## 2022-01-02 DIAGNOSIS — I2510 Atherosclerotic heart disease of native coronary artery without angina pectoris: Secondary | ICD-10-CM | POA: Diagnosis not present

## 2022-01-02 DIAGNOSIS — N179 Acute kidney failure, unspecified: Secondary | ICD-10-CM | POA: Diagnosis not present

## 2022-01-02 DIAGNOSIS — F419 Anxiety disorder, unspecified: Secondary | ICD-10-CM | POA: Diagnosis not present

## 2022-01-02 DIAGNOSIS — I10 Essential (primary) hypertension: Secondary | ICD-10-CM | POA: Diagnosis not present

## 2022-01-02 DIAGNOSIS — K219 Gastro-esophageal reflux disease without esophagitis: Secondary | ICD-10-CM | POA: Diagnosis not present

## 2022-01-02 DIAGNOSIS — B370 Candidal stomatitis: Secondary | ICD-10-CM | POA: Diagnosis not present

## 2022-01-17 DIAGNOSIS — G4459 Other complicated headache syndrome: Secondary | ICD-10-CM | POA: Diagnosis not present

## 2022-01-17 DIAGNOSIS — R2689 Other abnormalities of gait and mobility: Secondary | ICD-10-CM | POA: Diagnosis not present

## 2022-01-17 DIAGNOSIS — E1142 Type 2 diabetes mellitus with diabetic polyneuropathy: Secondary | ICD-10-CM | POA: Diagnosis not present

## 2022-01-17 DIAGNOSIS — Z79891 Long term (current) use of opiate analgesic: Secondary | ICD-10-CM | POA: Diagnosis not present

## 2022-01-17 DIAGNOSIS — M79606 Pain in leg, unspecified: Secondary | ICD-10-CM | POA: Diagnosis not present

## 2022-01-17 DIAGNOSIS — M5416 Radiculopathy, lumbar region: Secondary | ICD-10-CM | POA: Diagnosis not present

## 2022-01-17 DIAGNOSIS — M542 Cervicalgia: Secondary | ICD-10-CM | POA: Diagnosis not present

## 2022-01-17 DIAGNOSIS — M79643 Pain in unspecified hand: Secondary | ICD-10-CM | POA: Diagnosis not present

## 2022-01-21 DIAGNOSIS — E291 Testicular hypofunction: Secondary | ICD-10-CM | POA: Diagnosis not present

## 2022-01-21 DIAGNOSIS — K529 Noninfective gastroenteritis and colitis, unspecified: Secondary | ICD-10-CM | POA: Diagnosis not present

## 2022-01-21 DIAGNOSIS — I5032 Chronic diastolic (congestive) heart failure: Secondary | ICD-10-CM | POA: Diagnosis not present

## 2022-01-21 DIAGNOSIS — N481 Balanitis: Secondary | ICD-10-CM | POA: Diagnosis not present

## 2022-01-21 DIAGNOSIS — K219 Gastro-esophageal reflux disease without esophagitis: Secondary | ICD-10-CM | POA: Diagnosis not present

## 2022-01-21 DIAGNOSIS — I2510 Atherosclerotic heart disease of native coronary artery without angina pectoris: Secondary | ICD-10-CM | POA: Diagnosis not present

## 2022-01-21 DIAGNOSIS — F419 Anxiety disorder, unspecified: Secondary | ICD-10-CM | POA: Diagnosis not present

## 2022-01-21 DIAGNOSIS — I10 Essential (primary) hypertension: Secondary | ICD-10-CM | POA: Diagnosis not present

## 2022-01-21 DIAGNOSIS — J449 Chronic obstructive pulmonary disease, unspecified: Secondary | ICD-10-CM | POA: Diagnosis not present

## 2022-01-25 ENCOUNTER — Other Ambulatory Visit: Payer: Self-pay | Admitting: Cardiology

## 2022-03-05 ENCOUNTER — Ambulatory Visit: Payer: Medicare PPO | Attending: Internal Medicine | Admitting: Internal Medicine

## 2022-03-05 ENCOUNTER — Encounter: Payer: Self-pay | Admitting: Internal Medicine

## 2022-03-05 VITALS — BP 124/70 | HR 57 | Ht 68.0 in | Wt 219.8 lb

## 2022-03-05 DIAGNOSIS — Z9861 Coronary angioplasty status: Secondary | ICD-10-CM | POA: Diagnosis not present

## 2022-03-05 DIAGNOSIS — I2510 Atherosclerotic heart disease of native coronary artery without angina pectoris: Secondary | ICD-10-CM

## 2022-03-05 MED ORDER — NITROGLYCERIN 0.4 MG SL SUBL: SUBLINGUAL_TABLET | SUBLINGUAL | 3 refills | Status: DC

## 2022-03-05 NOTE — Patient Instructions (Signed)
Medication Instructions:  Your physician recommends that you continue on your current medications as directed. Please refer to the Current Medication list given to you today.   Labwork: None  Testing/Procedures: None  Follow-Up: Follow up with Dr. Mallipeddi in 1 year.   Any Other Special Instructions Will Be Listed Below (If Applicable).     If you need a refill on your cardiac medications before your next appointment, please call your pharmacy.  

## 2022-03-05 NOTE — Progress Notes (Signed)
Cardiology Office Note  Date: 03/05/2022   ID: ABENEZER ODONELL, DOB Mar 09, 1951, MRN [id number]  PCP:  Rosalee Kaufman, PA-C  Cardiologist:  None Electrophysiologist:  None   Reason for Office Visit: f/u of CAD   History of Present Illness: [name] is a 71 y.o. male known to have CAD status post LAD PCI in 2010 and 2016 with repeat [place] in 2019 showing patent stents with residual jailed diagonal 70% and RV marginal branch 80% stenosis with normal LVEF, HLD, OSA on CPAP, diastolic CHF presented to cardiology clinic for follow-up visit. No interval ER visits or hospitalizations. Patient denied any rest or exertional chest discomfort, tightness, heaviness or pressure, rest or exertional dyspnea , palpitations, light-headedness, syncope and LE swelling. Compliant with medications and no side-effects. No bleeding complications.    Past Medical History:  Diagnosis Date   Anxiety    HX PANIC ATTACKS   CHF (congestive heart failure) (HCC)    Chronic cough    Coronary artery disease    a. s/p DES to LAD and PTCA of D1 in 2010 b. cath in 2016 showing patent stent with 50% stenosis of jailed D1 c. cath in 10/2017 showing patent LAD stent with 70% D1 stenosis and 80% RV branch stenosis and medical management recommended   Dry eyes    Fibromyalgia    Fluid retention    GERD (gastroesophageal reflux disease)    Headache(784.0)    Herpes genitalis in men    History of kidney stones    History of vertigo    Hypertension    Narcolepsy    Neuropathy    Penile lesion    Sjogren's disease ([place])    Sleep apnea    USES C-PAP    Past Surgical History:  Procedure Laterality Date   BREAST SURGERY     BIL MASTECTOMY  FOR BENIGN LUMPS   CARDIAC CATHETERIZATION N/A 01/09/2015   Procedure: Left Heart Cath and Coronary Angiography;  Surgeon: Leonie Man, MD;  Location: [facility];  Service: Cardiovascular;  Laterality: N/A;   CARPAL TUNNELL  BIL   CIRCUMCISION      CORONARY ANGIOPLASTY WITH STENT PLACEMENT  X1 2010   HERNIA REPAIR     ING HERNIA REPAIR   LEFT HEART CATH AND CORONARY ANGIOGRAPHY N/A 10/15/2017   Procedure: LEFT HEART CATH AND CORONARY ANGIOGRAPHY;  Surgeon: Martinique, Peter M, MD;  Location: [facility];  Service: Cardiovascular;  Laterality: N/A;   LEFT HEART CATHETERIZATION WITH CORONARY ANGIOGRAM N/A 10/11/2013   Procedure: LEFT HEART CATHETERIZATION WITH CORONARY ANGIOGRAM;  Surgeon: Sinclair Grooms, MD;  Location: [facility] CATH LAB;  Service: Cardiovascular;  Laterality: N/A;   LEFT HEART CATHETERIZATION WITH CORONARY ANGIOGRAM N/A 06/21/2014   Procedure: LEFT HEART CATHETERIZATION WITH CORONARY ANGIOGRAM;  Surgeon: Troy Sine, MD;  Location: [facility] CATH LAB;  Service: Cardiovascular;  Laterality: N/A;   MEATOTOMY  X2   PERCUTANEOUS CORONARY STENT INTERVENTION (PCI-S) N/A 06/23/2014   Procedure: PERCUTANEOUS CORONARY STENT INTERVENTION (PCI-S);  Surgeon: Jettie Booze, MD;  Location: [facility] CATH LAB;  Service: Cardiovascular;  Laterality: N/A;   VASECTOMY     WRIST FRACTURE SURGERY  L  X3 SURGERIES    Current Outpatient Medications  Medication Sig Dispense Refill   aspirin EC 81 MG tablet Take 81 mg by mouth at bedtime.      atorvastatin (LIPITOR) 40 MG tablet TAKE 1 TABLET IN THE EVENING 90 tablet 0  b complex vitamins tablet Take 1 tablet by mouth daily.     betamethasone valerate ointment (VALISONE) 0.1 % Apply 1 application topically 2 (two) times daily as needed (itching).     Butalbital-APAP-Caffeine 50-325-40 MG capsule Take 1-2 capsules by mouth as needed for headache. 30 capsule 0   Cholecalciferol (VITAMIN D) 2000 units tablet Take 2,000 Units by mouth daily.     citalopram (CELEXA) 20 MG tablet Take 20 mg by mouth daily.      clonazePAM (KLONOPIN) 0.5 MG tablet Take 1 tablet (0.5 mg total) by mouth See admin instructions. Take 1 or 2 tablets as needed for panick attacks 10 tablet 0   Coenzyme Q10 100 MG capsule Take by  mouth daily at 6 (six) AM.     diphenhydrAMINE (BENADRYL) 25 MG tablet Take 25-50 mg by mouth 2 (two) times daily as needed for itching.      fluticasone (CUTIVATE) 0.05 % cream Apply 1 application topically 2 (two) times daily as needed (rash). Applied to face and/or penis area(s)     furosemide (LASIX) 40 MG tablet Take 80 mg by mouth daily.     guaifenesin (HUMIBID E) 400 MG TABS tablet Take 800 mg by mouth daily as needed (for post nasal drip/cough).      HYDROmorphone (DILAUDID) 2 MG tablet Take 1 tablet (2 mg total) by mouth every 3 (three) hours as needed for severe pain. 10 tablet 0   ibuprofen (ADVIL,MOTRIN) 200 MG tablet Take 600 mg by mouth 4 (four) times daily as needed for mild pain or moderate pain.      isosorbide mononitrate (IMDUR) 120 MG 24 hr tablet TAKE 1 TABLET EVERY DAY 90 tablet 3   isosorbide mononitrate (IMDUR) 30 MG 24 hr tablet Take 30 mg by mouth at bedtime.     L-Lysine 500 MG TABS Take 500 mg by mouth daily.     loperamide (IMODIUM A-D) 2 MG tablet Take 6 mg by mouth 4 (four) times daily as needed for diarrhea or loose stools.     metoprolol succinate (TOPROL-XL) 50 MG 24 hr tablet Take 1 tablet (50 mg total) by mouth daily. Take with or immediately following a meal. 90 tablet 3   Multiple Vitamins-Minerals (MULTIVITAMIN WITH MINERALS) tablet Take 1 tablet by mouth every evening.      naloxone (NARCAN) nasal spray 4 mg/0.1 mL as needed. Over dose     naproxen sodium (ALEVE) 220 MG tablet Take 220 mg by mouth.     NON FORMULARY at bedtime. CPAP     nystatin cream (MYCOSTATIN) Apply topically as needed. Yeast inf     omeprazole (PRILOSEC) 40 MG capsule Take 40 mg by mouth daily before breakfast.     polyvinyl alcohol-povidone (HYPOTEARS) 1.4-0.6 % ophthalmic solution Place 1-2 drops into both eyes daily as needed (dry eyes). For dry eyes     potassium chloride (MICRO-K) 10 MEQ CR capsule Take by mouth 2 (two) times daily.     testosterone cypionate (DEPOTESTOTERONE  CYPIONATE) 100 MG/ML injection Inject 100 mg into the muscle every 7 (seven) days.     topiramate (TOPAMAX) 25 MG tablet Take 50 mg by mouth at bedtime.     Turmeric (QC TUMERIC COMPLEX PO) Take by mouth.     vitamin C (ASCORBIC ACID) 500 MG tablet Take 500 mg by mouth every morning.     nitroGLYCERIN (NITROSTAT) 0.4 MG SL tablet Take one every 5 minutes as needed for chest pain 25 tablet 3  No current facility-administered medications for this visit.   Allergies:  Bee venom, Duloxetine, Fentanyl, Lidocaine, Meperidine hcl, Methadone, Oxycodone, Oxycodone hcl, Pregabalin, Zolpidem, and Zolpidem tartrate   Social History: The patient  reports that he quit smoking about 21 years ago. His smoking use included cigarettes. He started smoking about 54 years ago. He has a 80.00 pack-year smoking history. He has never used smokeless tobacco. He reports that he does not drink alcohol and does not use drugs.   Family History: The patient's family history includes Heart disease in an other family member.   ROS:  Please see the history of present illness. Otherwise, complete review of systems is positive for none.  All other systems are reviewed and negative.   Physical Exam: VS:  BP 124/70   Pulse (!) 57   Ht '5\' 8"'$  (1.727 m)   Wt 219 lb 12.8 oz (99.7 kg)   SpO2 98%   BMI 33.42 kg/m , BMI Body mass index is 33.42 kg/m.  Wt Readings from Last 3 Encounters:  03/05/22 219 lb 12.8 oz (99.7 kg)  12/13/21 215 lb (97.5 kg)  02/12/21 231 lb 9.6 oz (105.1 kg)    General: Patient appears comfortable at rest. HEENT: Conjunctiva and lids normal, oropharynx clear with moist mucosa. Neck: Supple, no elevated JVP or carotid bruits, no thyromegaly. Lungs: Clear to auscultation, nonlabored breathing at rest. Cardiac: Regular rate and rhythm, no S3 or significant systolic murmur, no pericardial rub. Abdomen: Soft, nontender, no hepatomegaly, bowel sounds present, no guarding or rebound. Extremities: No  pitting edema, distal pulses 2+. Skin: Warm and dry. Musculoskeletal: No kyphosis. Neuropsychiatric: Alert and oriented x3, affect grossly appropriate.  ECG:  An ECG dated 12/16/2021 was personally reviewed today and demonstrated:  Normal sinus rhythm with PACs.  Recent Labwork: 12/13/2021: ALT 22; AST 24 12/19/2021: BUN 8; Creatinine, Ser 0.93; Hemoglobin 16.1; Magnesium 2.1; Platelets 380; Potassium 4.0; Sodium 135     Component Value Date/Time   CHOL 168 10/11/2013 0357   TRIG 172 (H) 10/11/2013 0357   HDL 34 (L) 10/11/2013 0357   CHOLHDL 4.9 10/11/2013 0357   VLDL 34 10/11/2013 0357   LDLCALC 100 (H) 10/11/2013 0357    Other Studies Reviewed Today: Echo in 2016 - Left ventricle: The cavity size was normal. Wall thickness was    increased in a pattern of mild LVH. Systolic function was normal.    The estimated ejection fraction was in the range of 60% to 65%.    Wall motion was normal; there were no regional wall motion    abnormalities. Left ventricular diastolic function parameters    were normal.  - Aortic valve: Mildly calcified annulus. Trileaflet; mildly    thickened leaflets. Valve area (VTI): 2.64 cm^2. Valve area    (Vmax): 2.8 cm^2.  - Mitral valve: Mildly calcified annulus. Mildly thickened leaflets    .  - Left atrium: The atrium was moderately dilated.  - Right atrium: The atrium was mildly dilated.  - Technically adequate study.   Cath in 2019 Ost 1st Diag to 1st Diag lesion is 70% stenosed. Ost 2nd Diag lesion is 50% stenosed. Acute Mrg lesion is 80% stenosed. Previously placed Prox LAD to Mid LAD stent (unknown type) is widely patent. The left ventricular systolic function is normal. LV end diastolic pressure is normal. The left ventricular ejection fraction is 55-65% by visual estimate.   1. Small vessel obstructive CAD with 70% ostial first diagonal ( jailed by stent) and 80% ostial RV  marginal branch 2. Continued patency of stents in the LAD 3. Normal  LV function 4. Normal LVEDP   Plan: really no change from last cardiac cath in August 2016. Recommend continued medical therapy.  Assessment and Plan: Patient is a 71 year old M known to have CAD status post LAD PCI in 2010 and 2016 with repeat [place] in 2019 showing patent stents with residual jailed diagonal 70% and RV marginal branch 80% stenosis with normal LVEF, HLD, OSA on CPAP, diastolic CHF presented to cardiology clinic for follow-up visit.  #CAD status post LAD PCI in 2010 and 2016 with repeat [place] in 2019 showing patent stents with residual jailed diagonal 70% and RV marginal branch 80% stenosis with normal LVEF, currently angina free Plan -Continue aspirin 81 mg once daily -Continue atorvastatin 40 mg nightly -Continue metoprolol succinate 50 mg once daily, Imdur 120 mg in the morning and 60 mg in the evening as part of antianginal therapy -SL NTG 2.4 mg as needed chest pains -ER precautions for chest pain provided  #HLD, current at goal Plan -Continue atorvastatin 40 mg nightly. Goal less than 70. LDL was 59 this year.  #Chronic diastolic CHF, currently compensated Plan -Continue Lasix 80 mg once  #OSA on CPAP Plan -Continue CPAP  I have spent a total of 33 minutes with patient reviewing chart, EKGs, labs and examining patient as well as establishing an assessment and plan that was discussed with the patient.  > 50% of time was spent in direct patient care.     Medication Adjustments/Labs and Tests Ordered: Current medicines are reviewed at length with the patient today.  Concerns regarding medicines are outlined above.   Tests Ordered: No orders of the defined types were placed in this encounter.   Medication Changes: Meds ordered this encounter  Medications   nitroGLYCERIN (NITROSTAT) 0.4 MG SL tablet    Sig: Take one every 5 minutes as needed for chest pain    Dispense:  25 tablet    Refill:  3    Pt needs appt for further refills    Disposition:  Follow up   one year  Signed, Terriah Reggio Fidel Levy, MD, 03/05/2022 3:02 PM    [facility] [place] [address], [place], [place] [address]

## 2022-03-11 DIAGNOSIS — I10 Essential (primary) hypertension: Secondary | ICD-10-CM | POA: Diagnosis not present

## 2022-03-11 DIAGNOSIS — K219 Gastro-esophageal reflux disease without esophagitis: Secondary | ICD-10-CM | POA: Diagnosis not present

## 2022-04-07 ENCOUNTER — Other Ambulatory Visit: Payer: Self-pay | Admitting: Student

## 2022-04-10 DIAGNOSIS — Z79891 Long term (current) use of opiate analgesic: Secondary | ICD-10-CM | POA: Diagnosis not present

## 2022-04-10 DIAGNOSIS — M79606 Pain in leg, unspecified: Secondary | ICD-10-CM | POA: Diagnosis not present

## 2022-04-10 DIAGNOSIS — M5416 Radiculopathy, lumbar region: Secondary | ICD-10-CM | POA: Diagnosis not present

## 2022-04-10 DIAGNOSIS — M79643 Pain in unspecified hand: Secondary | ICD-10-CM | POA: Diagnosis not present

## 2022-04-10 DIAGNOSIS — G4459 Other complicated headache syndrome: Secondary | ICD-10-CM | POA: Diagnosis not present

## 2022-04-10 DIAGNOSIS — E1142 Type 2 diabetes mellitus with diabetic polyneuropathy: Secondary | ICD-10-CM | POA: Diagnosis not present

## 2022-04-10 DIAGNOSIS — R2689 Other abnormalities of gait and mobility: Secondary | ICD-10-CM | POA: Diagnosis not present

## 2022-04-10 DIAGNOSIS — M542 Cervicalgia: Secondary | ICD-10-CM | POA: Diagnosis not present

## 2022-05-15 DIAGNOSIS — R739 Hyperglycemia, unspecified: Secondary | ICD-10-CM | POA: Diagnosis not present

## 2022-05-15 DIAGNOSIS — E291 Testicular hypofunction: Secondary | ICD-10-CM | POA: Diagnosis not present

## 2022-05-15 DIAGNOSIS — E782 Mixed hyperlipidemia: Secondary | ICD-10-CM | POA: Diagnosis not present

## 2022-05-15 DIAGNOSIS — Z1159 Encounter for screening for other viral diseases: Secondary | ICD-10-CM | POA: Diagnosis not present

## 2022-05-15 DIAGNOSIS — K219 Gastro-esophageal reflux disease without esophagitis: Secondary | ICD-10-CM | POA: Diagnosis not present

## 2022-05-15 DIAGNOSIS — I10 Essential (primary) hypertension: Secondary | ICD-10-CM | POA: Diagnosis not present

## 2022-05-15 DIAGNOSIS — E7849 Other hyperlipidemia: Secondary | ICD-10-CM | POA: Diagnosis not present

## 2022-05-20 DIAGNOSIS — I5032 Chronic diastolic (congestive) heart failure: Secondary | ICD-10-CM | POA: Diagnosis not present

## 2022-05-20 DIAGNOSIS — I10 Essential (primary) hypertension: Secondary | ICD-10-CM | POA: Diagnosis not present

## 2022-05-20 DIAGNOSIS — I2510 Atherosclerotic heart disease of native coronary artery without angina pectoris: Secondary | ICD-10-CM | POA: Diagnosis not present

## 2022-05-20 DIAGNOSIS — M797 Fibromyalgia: Secondary | ICD-10-CM | POA: Diagnosis not present

## 2022-05-20 DIAGNOSIS — E7849 Other hyperlipidemia: Secondary | ICD-10-CM | POA: Diagnosis not present

## 2022-05-20 DIAGNOSIS — J449 Chronic obstructive pulmonary disease, unspecified: Secondary | ICD-10-CM | POA: Diagnosis not present

## 2022-05-20 DIAGNOSIS — Z0001 Encounter for general adult medical examination with abnormal findings: Secondary | ICD-10-CM | POA: Diagnosis not present

## 2022-05-24 ENCOUNTER — Other Ambulatory Visit: Payer: Self-pay | Admitting: Student

## 2022-06-05 DIAGNOSIS — L03211 Cellulitis of face: Secondary | ICD-10-CM | POA: Diagnosis not present

## 2022-06-13 DIAGNOSIS — J449 Chronic obstructive pulmonary disease, unspecified: Secondary | ICD-10-CM | POA: Diagnosis not present

## 2022-06-13 DIAGNOSIS — M797 Fibromyalgia: Secondary | ICD-10-CM | POA: Diagnosis not present

## 2022-06-13 DIAGNOSIS — F419 Anxiety disorder, unspecified: Secondary | ICD-10-CM | POA: Diagnosis not present

## 2022-06-13 DIAGNOSIS — E782 Mixed hyperlipidemia: Secondary | ICD-10-CM | POA: Diagnosis not present

## 2022-06-13 DIAGNOSIS — E7849 Other hyperlipidemia: Secondary | ICD-10-CM | POA: Diagnosis not present

## 2022-06-13 DIAGNOSIS — K219 Gastro-esophageal reflux disease without esophagitis: Secondary | ICD-10-CM | POA: Diagnosis not present

## 2022-06-13 DIAGNOSIS — I10 Essential (primary) hypertension: Secondary | ICD-10-CM | POA: Diagnosis not present

## 2022-06-13 DIAGNOSIS — E291 Testicular hypofunction: Secondary | ICD-10-CM | POA: Diagnosis not present

## 2022-06-13 DIAGNOSIS — I5032 Chronic diastolic (congestive) heart failure: Secondary | ICD-10-CM | POA: Diagnosis not present

## 2022-06-13 DIAGNOSIS — I2510 Atherosclerotic heart disease of native coronary artery without angina pectoris: Secondary | ICD-10-CM | POA: Diagnosis not present

## 2022-07-03 DIAGNOSIS — N481 Balanitis: Secondary | ICD-10-CM | POA: Diagnosis not present

## 2022-07-22 DIAGNOSIS — J019 Acute sinusitis, unspecified: Secondary | ICD-10-CM | POA: Diagnosis not present

## 2022-07-22 DIAGNOSIS — R030 Elevated blood-pressure reading, without diagnosis of hypertension: Secondary | ICD-10-CM | POA: Diagnosis not present

## 2022-07-22 DIAGNOSIS — J209 Acute bronchitis, unspecified: Secondary | ICD-10-CM | POA: Diagnosis not present

## 2022-07-22 DIAGNOSIS — Z6837 Body mass index (BMI) 37.0-37.9, adult: Secondary | ICD-10-CM | POA: Diagnosis not present

## 2022-07-22 DIAGNOSIS — Z20828 Contact with and (suspected) exposure to other viral communicable diseases: Secondary | ICD-10-CM | POA: Diagnosis not present

## 2022-09-09 DIAGNOSIS — K219 Gastro-esophageal reflux disease without esophagitis: Secondary | ICD-10-CM | POA: Diagnosis not present

## 2022-09-09 DIAGNOSIS — I10 Essential (primary) hypertension: Secondary | ICD-10-CM | POA: Diagnosis not present

## 2022-09-09 DIAGNOSIS — E7849 Other hyperlipidemia: Secondary | ICD-10-CM | POA: Diagnosis not present

## 2022-09-09 DIAGNOSIS — R739 Hyperglycemia, unspecified: Secondary | ICD-10-CM | POA: Diagnosis not present

## 2022-09-09 DIAGNOSIS — D696 Thrombocytopenia, unspecified: Secondary | ICD-10-CM | POA: Diagnosis not present

## 2022-09-09 DIAGNOSIS — F419 Anxiety disorder, unspecified: Secondary | ICD-10-CM | POA: Diagnosis not present

## 2022-09-09 DIAGNOSIS — E782 Mixed hyperlipidemia: Secondary | ICD-10-CM | POA: Diagnosis not present

## 2022-09-09 DIAGNOSIS — I5032 Chronic diastolic (congestive) heart failure: Secondary | ICD-10-CM | POA: Diagnosis not present

## 2022-09-09 DIAGNOSIS — Z1159 Encounter for screening for other viral diseases: Secondary | ICD-10-CM | POA: Diagnosis not present

## 2022-09-09 DIAGNOSIS — E875 Hyperkalemia: Secondary | ICD-10-CM | POA: Diagnosis not present

## 2022-09-09 DIAGNOSIS — E291 Testicular hypofunction: Secondary | ICD-10-CM | POA: Diagnosis not present

## 2022-09-16 DIAGNOSIS — I5032 Chronic diastolic (congestive) heart failure: Secondary | ICD-10-CM | POA: Diagnosis not present

## 2022-09-16 DIAGNOSIS — J449 Chronic obstructive pulmonary disease, unspecified: Secondary | ICD-10-CM | POA: Diagnosis not present

## 2022-09-16 DIAGNOSIS — F419 Anxiety disorder, unspecified: Secondary | ICD-10-CM | POA: Diagnosis not present

## 2022-09-16 DIAGNOSIS — K219 Gastro-esophageal reflux disease without esophagitis: Secondary | ICD-10-CM | POA: Diagnosis not present

## 2022-09-16 DIAGNOSIS — E876 Hypokalemia: Secondary | ICD-10-CM | POA: Diagnosis not present

## 2022-09-16 DIAGNOSIS — I2510 Atherosclerotic heart disease of native coronary artery without angina pectoris: Secondary | ICD-10-CM | POA: Diagnosis not present

## 2022-09-16 DIAGNOSIS — I10 Essential (primary) hypertension: Secondary | ICD-10-CM | POA: Diagnosis not present

## 2022-09-16 DIAGNOSIS — M797 Fibromyalgia: Secondary | ICD-10-CM | POA: Diagnosis not present

## 2022-09-16 DIAGNOSIS — E291 Testicular hypofunction: Secondary | ICD-10-CM | POA: Diagnosis not present

## 2022-09-26 DIAGNOSIS — N480 Leukoplakia of penis: Secondary | ICD-10-CM | POA: Diagnosis not present

## 2022-09-26 DIAGNOSIS — N485 Ulcer of penis: Secondary | ICD-10-CM | POA: Diagnosis not present

## 2022-09-26 DIAGNOSIS — E291 Testicular hypofunction: Secondary | ICD-10-CM | POA: Diagnosis not present

## 2022-09-30 ENCOUNTER — Telehealth: Payer: Self-pay | Admitting: Internal Medicine

## 2022-09-30 ENCOUNTER — Other Ambulatory Visit: Payer: Self-pay | Admitting: Urology

## 2022-09-30 NOTE — Telephone Encounter (Signed)
   Pre-operative Risk Assessment    Patient Name: [name]  DOB: 1950/11/19 MRN: [id number]      Request for Surgical Clearance    Procedure:   Penile biopsy  Date of Surgery:  Clearance 10/24/22                                 Surgeon:  Dr. Bjorn Pippin Surgeon's Group or Practice Name:  [facility] Phone number:  [phone]  Fax number:  [phone]   Type of Clearance Requested:   - Medical  - Pharmacy:  Hold Aspirin     Type of Anesthesia:  General    Additional requests/questions:   Caller stated they will need medical and pharmacy clearance.  Signed, Annetta Maw   09/30/2022, 1:47 PM

## 2022-10-07 NOTE — Telephone Encounter (Signed)
   Patient Name: [name]  DOB: [birth year] MRN: [id number]  Primary Cardiologist: None  Chart reviewed as part of pre-operative protocol coverage. Given past medical history and time since last visit, based on ACC/AHA guidelines, [name] is at acceptable risk for the planned procedure without further cardiovascular testing.  Patient was contacted and reports no changes since his previous visit.  He is able to complete 4 METS of activity without any difficulty.  Patient is RCRI is 6.6%  The patient was advised that if he develops new symptoms prior to surgery to contact our office to arrange for a follow-up visit, and he verbalized understanding.  Regarding ASA therapy, we recommend  it may be stopped 5-7 days prior to surgery with a plan to resume it as soon as felt to be feasible from a surgical standpoint in the post-operative period.   I will route this recommendation to the requesting party via Epic fax function and remove from pre-op pool.  Please call with questions.  Napoleon Form, Leodis Rains, NP 10/07/2022, 12:36 PM

## 2022-10-08 ENCOUNTER — Encounter (HOSPITAL_COMMUNITY): Payer: Self-pay

## 2022-10-12 NOTE — Patient Instructions (Signed)
SURGICAL WAITING ROOM VISITATION Patients having surgery or a procedure may have no more than 2 support people in the waiting area - these visitors may rotate in the visitor waiting room.   Due to an increase in RSV and influenza rates and associated hospitalizations, children ages 64 and under may not visit patients in [facility] hospitals. If the patient needs to stay at the hospital during part of their recovery, the visitor guidelines for inpatient rooms apply.  PRE-OP VISITATION  Pre-op nurse will coordinate an appropriate time for 1 support person to accompany the patient in pre-op.  This support person may not rotate.  This visitor will be contacted when the time is appropriate for the visitor to come back in the pre-op area.  Please refer to the [facility] website for the visitor guidelines for Inpatients (after your surgery is over and you are in a regular room).  You are not required to quarantine at this time prior to your surgery. However, you must do this: Hand Hygiene often Do NOT share personal items Notify your provider if you are in close contact with someone who has COVID or you develop fever 100.4 or greater, new onset of sneezing, cough, sore throat, shortness of breath or body aches.  If you test positive for Covid or have been in contact with anyone that has tested positive in the last 10 days please notify you surgeon.    Your procedure is scheduled on:   Friday  October 24, 2022   Report to [facility] Main Entrance: [place] entrance where the [employer] is available.   Report to admitting at:  05:15   AM  +++++Call this number if you have any questions or problems the morning of surgery [phone]  DO NOT EAT OR DRINK ANYTHING AFTER MIDNIGHT THE NIGHT PRIOR TO YOUR SURGERY / PROCEDURE.   FOLLOW BOWEL PREP AND ANY ADDITIONAL PRE OP INSTRUCTIONS YOU RECEIVED FROM YOUR SURGEON'S OFFICE!!!   Oral Hygiene is also important to reduce your risk of  infection.        Remember - BRUSH YOUR TEETH THE MORNING OF SURGERY WITH YOUR REGULAR TOOTHPASTE  Do NOT smoke after Midnight the night before surgery.  Take ONLY these medicines the morning of surgery with A SIP OF WATER: Isosorbide (Imdur), citalopram (Celexa),  Metoprolol, Omeprazole. You may take Dilaudid for severe pain and Clonazepam for anxiety if needed.   If You have been diagnosed with Sleep Apnea - Bring CPAP mask and tubing day of surgery. We will provide you with a CPAP machine on the day of your surgery.                   You may not have any metal on your body including  jewelry, and body piercing  Do not wear  lotions, powders,  cologne, or deodorant  Men may shave face and neck.  Contacts, Hearing Aids, dentures or bridgework may not be worn into surgery. DENTURES WILL BE REMOVED PRIOR TO SURGERY PLEASE DO NOT APPLY "Poly grip" OR ADHESIVES!!!  Patients discharged on the day of surgery will not be allowed to drive home.  Someone NEEDS to stay with you for the first 24 hours after anesthesia.  Do not bring your home medications to the hospital. The Pharmacy will dispense medications listed on your medication list to you during your admission in the Hospital.  Special Instructions: Bring a copy of your healthcare power of attorney and living will documents the  day of surgery, if you wish to have them scanned into your [facility] Records- EPIC  Please read over the following fact sheets you were given: IF YOU HAVE QUESTIONS ABOUT YOUR PRE-OP INSTRUCTIONS, PLEASE CALL [phone].   [facility] - Preparing for Surgery Before surgery, you can play an important role.  Because skin is not sterile, your skin needs to be as free of germs as possible.  You can reduce the number of germs on your skin by washing with CHG (chlorahexidine gluconate) soap before surgery.  CHG is an antiseptic cleaner which kills germs and bonds with the skin to continue killing germs even after  washing. Please DO NOT use if you have an allergy to CHG or antibacterial soaps.  If your skin becomes reddened/irritated stop using the CHG and inform your nurse when you arrive at Short Stay. Do not shave (including legs and underarms) for at least 48 hours prior to the first CHG shower.  You may shave your face/neck.  Please follow these instructions carefully:  1.  Shower with CHG Soap the night before surgery and the  morning of surgery.  2.  If you choose to wash your hair, wash your hair first as usual with your normal  shampoo.  3.  After you shampoo, rinse your hair and body thoroughly to remove the shampoo.                             4.  Use CHG as you would any other liquid soap.  You can apply chg directly to the skin and wash.  Gently with a scrungie or clean washcloth.  5.  Apply the CHG Soap to your body ONLY FROM THE NECK DOWN.   Do not use on face/ open                           Wound or open sores. Avoid contact with eyes, ears mouth and genitals (private parts).                       Wash face,  Genitals (private parts) with your normal soap.             6.  Wash thoroughly, paying special attention to the area where your  surgery  will be performed.  7.  Thoroughly rinse your body with warm water from the neck down.  8.  DO NOT shower/wash with your normal soap after using and rinsing off the CHG Soap.            9.  Pat yourself dry with a clean towel.            10.  Wear clean pajamas.            11.  Place clean sheets on your bed the night of your first shower and do not  sleep with pets.  ON THE DAY OF SURGERY : Do not apply any lotions/deodorants the morning of surgery.  Please wear clean clothes to the hospital/surgery center.    FAILURE TO FOLLOW THESE INSTRUCTIONS MAY RESULT IN THE CANCELLATION OF YOUR SURGERY  PATIENT SIGNATURE_________________________________  NURSE  SIGNATURE__________________________________  ________________________________________________________________________

## 2022-10-12 NOTE — Progress Notes (Signed)
COVID Vaccine received:  [x]  No []  Yes Date of any COVID positive Test in last 90 days:  None  PCP - Wayland Denis, PA-C Cardiologist - Luane School, MD   Chest x-ray - 12-13-2021  1v  Epic EKG -  12-16-2021 Epic Stress Test - 2018  Epic ECHO - 2016 Epic Cardiac Cath - multiple; has DES x2,  last one 10-15-2017  LHC by Dr. Peter Swaziland  PCR screen: []  Ordered & Completed           []   No Order but Needs PROFEND           [x]   N/A for this surgery  Surgery Plan:  [x]  Ambulatory                            []  Outpatient in bed                            []  Admit  Anesthesia:    [x]  General  []  Spinal                           []   Choice []   MAC  Bowel Prep - [x]  No  []   Yes ______  Pacemaker / ICD device [x]  No []  Yes   Spinal Cord Stimulator:[x]  No []  Yes       History of Sleep Apnea? []  No [x]  Yes   CPAP used?- []  No [x]  Yes    Does the patient monitor blood sugar?          []  No []  Yes  [x]  N/A  Patient has: [x]  NO Hx DM   []  Pre-DM                 []  DM1  []   DM2  Blood Thinner / Instructions:  none Aspirin Instructions:  ASA 81mg ,  Hold x 5-7-days per Robin Searing, NP  Patient is aware  ERAS Protocol Ordered: [x]  No  []  Yes Patient is to be NPO after:  midnight prior  Activity level: Patient is able to climb a flight of stairs without difficulty; [x]  No CP  but would have SOB  Patient can perform ADLs without assistance.   Anesthesia review: S/p MI & CAD- DES x 2 (multiple LHCs), HTN, Anxiety, OSA-CPAP GERD, CHF, Narcolepsy, Sjogren's diease.   Patient denies shortness of breath, fever, cough and chest pain at PAT appointment.  Patient verbalized understanding and agreement to the Pre-Surgical Instructions that were given to them at this PAT appointment. Patient was also educated of the need to review these PAT instructions again prior to his surgery.I reviewed the appropriate phone numbers to call if they have any and questions or concerns.

## 2022-10-14 ENCOUNTER — Encounter (HOSPITAL_COMMUNITY)
Admission: RE | Admit: 2022-10-14 | Discharge: 2022-10-14 | Disposition: A | Discharge status: Home / Self Care | Payer: Medicare PPO | Source: Ambulatory Visit | Attending: Urology | Admitting: Urology

## 2022-10-14 ENCOUNTER — Other Ambulatory Visit: Payer: Self-pay

## 2022-10-14 ENCOUNTER — Encounter (HOSPITAL_COMMUNITY): Payer: Self-pay

## 2022-10-14 VITALS — BP 111/74 | HR 62 | Temp 98.3°F | Resp 18 | Ht 68.0 in | Wt 235.0 lb

## 2022-10-14 DIAGNOSIS — I2510 Atherosclerotic heart disease of native coronary artery without angina pectoris: Secondary | ICD-10-CM

## 2022-10-14 DIAGNOSIS — Z01818 Encounter for other preprocedural examination: Secondary | ICD-10-CM | POA: Insufficient documentation

## 2022-10-14 HISTORY — DX: Acute myocardial infarction, unspecified: I21.9

## 2022-10-14 LAB — BASIC METABOLIC PANEL
Anion gap: 8 (ref 5–15)
BUN: 16 mg/dL (ref 8–23)
CO2: 28 mmol/L (ref 22–32)
Calcium: 8.5 mg/dL — ABNORMAL LOW (ref 8.9–10.3)
Chloride: 103 mmol/L (ref 98–111)
Creatinine, Ser: 1.09 mg/dL (ref 0.61–1.24)
GFR, Estimated: >60 mL/min (ref >60)
Glucose, Bld: 120 mg/dL — ABNORMAL HIGH (ref 70–99)
Potassium: 4.4 mmol/L (ref 3.5–5.1)
Sodium: 139 mmol/L (ref 135–145)

## 2022-10-14 LAB — CBC
HCT: 42 % (ref 39.0–52.0)
Hemoglobin: 14.1 g/dL (ref 13.0–17.0)
MCH: 32.6 pg (ref 26.0–34.0)
MCHC: 33.6 g/dL (ref 30.0–36.0)
MCV: 97.2 fL (ref 80.0–100.0)
Platelets: 218 10*3/uL (ref 150–400)
RBC: 4.32 MIL/uL (ref 4.22–5.81)
RDW: 13.1 % (ref 11.5–15.5)
WBC: 7.4 10*3/uL (ref 4.0–10.5)
nRBC: 0 % (ref 0.0–0.2)

## 2022-10-16 NOTE — Anesthesia Preprocedure Evaluation (Addendum)
Anesthesia Evaluation  Patient identified by MRN, date of birth, ID band Patient awake    Reviewed: Allergy & Precautions, H&P , NPO status , Patient's Chart, lab work & pertinent test results  Airway Mallampati: II  TM Distance: >3 FB Neck ROM: Full    Dental no notable dental hx.    Pulmonary shortness of breath, sleep apnea , former smoker   Pulmonary exam normal breath sounds clear to auscultation       Cardiovascular hypertension, Pt. on medications + angina  + CAD, + Past MI, + Cardiac Stents, +CHF and + DOE  Normal cardiovascular exam Rhythm:Regular Rate:Normal     Neuro/Psych  Headaches  Anxiety      negative psych ROS   GI/Hepatic Neg liver ROS,GERD  ,,  Endo/Other  negative endocrine ROS    Renal/GU Renal disease  negative genitourinary   Musculoskeletal  (+)  Fibromyalgia -  Abdominal  (+) + obese  Peds negative pediatric ROS (+)  Hematology negative hematology ROS (+)   Anesthesia Other Findings   Reproductive/Obstetrics negative OB ROS                             Anesthesia Physical Anesthesia Plan  ASA: 3  Anesthesia Plan: General   Post-op Pain Management: Minimal or no pain anticipated   Induction: Intravenous  PONV Risk Score and Plan: 2 and Ondansetron, Midazolam and Treatment may vary due to age or medical condition  Airway Management Planned: LMA  Additional Equipment:   Intra-op Plan:   Post-operative Plan: Extubation in OR  Informed Consent: I have reviewed the patients History and Physical, chart, labs and discussed the procedure including the risks, benefits and alternatives for the proposed anesthesia with the patient or authorized representative who has indicated his/her understanding and acceptance.     Dental advisory given  Plan Discussed with: CRNA  Anesthesia Plan Comments: (See PAT note 10/14/2022)       Anesthesia Quick  Evaluation

## 2022-10-16 NOTE — Progress Notes (Signed)
Anesthesia Chart Review   Case: [id number] Date/Time: 10/24/22 0715   Procedure: EXCISIONAL PENILE BIOPSY OF ULCER - 45 MINS FOR CASE   Anesthesia type: General   Pre-op diagnosis: PENILE ULCER   Location: [facility] PROCEDURE [facility] / [facility]   Surgeons: Bjorn Pippin, MD       DISCUSSION:72 y.o. former smoker with h/o HTN, GERD, sleep apnea, CHF, CAD (status post LAD PCI in 2010 and 2016 with repeat LHC in 2019 showing patent stents with residual jailed diagonal 70% and RV marginal branch 80% stenosis with normal LVEF), Sjogren's disease, penile ulcer scheduled for above procedure 10/24/2022 with Dr. Bjorn Pippin.   Per cardiology preoperative evaluation 10/07/2022, "Chart reviewed as part of pre-operative protocol coverage. Given past medical history and time since last visit, based on ACC/AHA guidelines, BREKEN ZILLIOX is at acceptable risk for the planned procedure without further cardiovascular testing.  Patient was contacted and reports no changes since his previous visit.  He is able to complete 4 METS of activity without any difficulty.  Patient is RCRI is 6.6%   The patient was advised that if he develops new symptoms prior to surgery to contact our office to arrange for a follow-up visit, and he verbalized understanding.   Regarding ASA therapy, we recommend  it may be stopped 5-7 days prior to surgery with a plan to resume it as soon as felt to be feasible from a surgical standpoint in the post-operative period. "  Anticipate pt can proceed with planned procedure barring acute status change.   VS: BP 111/74 Comment: right arm sitting  Pulse 62   Temp 36.8 C   Resp 18   Ht 5\' 8"  (1.727 m)   Wt 106.6 kg   SpO2 98%   BMI 35.73 kg/m   PROVIDERS: Royann Shivers, PA-C is PCP   Cardiologist - Luane School, MD    LABS: Labs reviewed: Acceptable for surgery. (all labs ordered are listed, but only abnormal results are displayed)  Labs Reviewed  BASIC METABOLIC PANEL - Abnormal;  Notable for the following components:      Result Value   Glucose, Bld 120 (*)    Calcium 8.5 (*)    All other components within normal limits  CBC     IMAGES:   EKG:   CV: Echo 01/08/2015 - Left ventricle: The cavity size was normal. Wall thickness was    increased in a pattern of mild LVH. Systolic function was normal.    The estimated ejection fraction was in the range of 60% to 65%.    Wall motion was normal; there were no regional wall motion    abnormalities. Left ventricular diastolic function parameters    were normal.  - Aortic valve: Mildly calcified annulus. Trileaflet; mildly    thickened leaflets. Valve area (VTI): 2.64 cm^2. Valve area    (Vmax): 2.8 cm^2.  - Mitral valve: Mildly calcified annulus. Mildly thickened leaflets    .  - Left atrium: The atrium was moderately dilated.  - Right atrium: The atrium was mildly dilated.  - Technically adequate study.  Past Medical History:  Diagnosis Date   Anxiety    HX PANIC ATTACKS   CHF (congestive heart failure) (HCC)    Chronic cough    Coronary artery disease    a. s/p DES to LAD and PTCA of D1 in 2010 b. cath in 2016 showing patent stent with 50% stenosis of jailed D1 c. cath in 10/2017 showing patent LAD stent  with 70% D1 stenosis and 80% RV branch stenosis and medical management recommended   Dry eyes    Fibromyalgia    Fluid retention    GERD (gastroesophageal reflux disease)    Headache(784.0)    Herpes genitalis in men    History of kidney stones    History of vertigo    Hypertension    Myocardial infarction (HCC)    x 4  last MI 2019   Neuropathy    Penile lesion    Sjogren's disease (HCC)    Sleep apnea    USES C-PAP    Past Surgical History:  Procedure Laterality Date   BREAST SURGERY     BIL MASTECTOMY  FOR BENIGN LUMPS   CARDIAC CATHETERIZATION N/A 01/09/2015   Procedure: Left Heart Cath and Coronary Angiography;  Surgeon: Marykay Lex, MD;  Location: [facility] INVASIVE CV LAB;  Service:  Cardiovascular;  Laterality: N/A;   CARPAL TUNNELL  BIL   CIRCUMCISION     CORONARY ANGIOPLASTY WITH STENT PLACEMENT  X1 2010   HERNIA REPAIR     ING HERNIA REPAIR   LEFT HEART CATH AND CORONARY ANGIOGRAPHY N/A 10/15/2017   Procedure: LEFT HEART CATH AND CORONARY ANGIOGRAPHY;  Surgeon: Swaziland, Peter M, MD;  Location: [facility] INVASIVE CV LAB;  Service: Cardiovascular;  Laterality: N/A;   LEFT HEART CATHETERIZATION WITH CORONARY ANGIOGRAM N/A 10/11/2013   Procedure: LEFT HEART CATHETERIZATION WITH CORONARY ANGIOGRAM;  Surgeon: Lesleigh Noe, MD;  Location: [facility] CATH LAB;  Service: Cardiovascular;  Laterality: N/A;   LEFT HEART CATHETERIZATION WITH CORONARY ANGIOGRAM N/A 06/21/2014   Procedure: LEFT HEART CATHETERIZATION WITH CORONARY ANGIOGRAM;  Surgeon: Lennette Bihari, MD;  Location: [facility] CATH LAB;  Service: Cardiovascular;  Laterality: N/A;   MEATOTOMY  X2   PERCUTANEOUS CORONARY STENT INTERVENTION (PCI-S) N/A 06/23/2014   Procedure: PERCUTANEOUS CORONARY STENT INTERVENTION (PCI-S);  Surgeon: Corky Crafts, MD;  Location: [facility] CATH LAB;  Service: Cardiovascular;  Laterality: N/A;   VASECTOMY     WRIST FRACTURE SURGERY  L  X3 SURGERIES    MEDICATIONS:  aspirin EC 81 MG tablet   atorvastatin (LIPITOR) 40 MG tablet   b complex vitamins tablet   betamethasone valerate ointment (VALISONE) 0.1 %   Butalbital-APAP-Caffeine 50-325-40 MG capsule   Cholecalciferol (VITAMIN D) 2000 units tablet   citalopram (CELEXA) 20 MG tablet   clonazePAM (KLONOPIN) 0.5 MG tablet   Coenzyme Q10 100 MG capsule   diphenhydrAMINE (BENADRYL) 25 MG tablet   fluticasone (CUTIVATE) 0.05 % cream   furosemide (LASIX) 40 MG tablet   guaifenesin (HUMIBID E) 400 MG TABS tablet   HYDROmorphone (DILAUDID) 2 MG tablet   ibuprofen (ADVIL,MOTRIN) 200 MG tablet   isosorbide mononitrate (IMDUR) 120 MG 24 hr tablet   isosorbide mononitrate (IMDUR) 30 MG 24 hr tablet   L-Lysine 500 MG TABS   loperamide (IMODIUM A-D) 2 MG tablet    metoprolol succinate (TOPROL-XL) 50 MG 24 hr tablet   Multiple Vitamins-Minerals (MULTIVITAMIN WITH MINERALS) tablet   naloxone (NARCAN) nasal spray 4 mg/0.1 mL   naproxen sodium (ALEVE) 220 MG tablet   nitroGLYCERIN (NITROSTAT) 0.4 MG SL tablet   NON FORMULARY   nystatin cream (MYCOSTATIN)   omeprazole (PRILOSEC) 40 MG capsule   polyvinyl alcohol-povidone (HYPOTEARS) 1.4-0.6 % ophthalmic solution   potassium chloride (MICRO-K) 10 MEQ CR capsule   testosterone cypionate (DEPOTESTOSTERONE CYPIONATE) 200 MG/ML injection   vitamin C (ASCORBIC ACID) 500 MG tablet   No current facility-administered medications  for this encounter.     Jodell Cipro Ward, PA-C [facility] Pre-Surgical Testing [phone]

## 2022-10-22 NOTE — H&P (Signed)
have irritation and discomfort of the head of my penis.  HPI: [name] is a 72 year-old male patient who was referred by Devin El, PA who is here for irritation and discomfort of the head of his penis.    09/26/22: [name] is a former patient with a history of BXO with reconstruction by [name] after his last visit with [place]. He is on TRT but his T remains low and he has thrombocytopenia. He is back now with a bleeding ulcer on the head of the penis that has been getting larger for the last 5 weeks. He is voiding ok. He drinks about 4 liters of fluid daily. He has had no hematuria.   Gu hx: 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 He presents now with progressive BXO. He reports that the skin tears in the ventral corona. He has penile pain as well. He was treated with valcyclovir by [name] which helped the pain but he needs a refill. He has multiple small pustules on the penis. He saw [name] about a month ago and didn't have anything to offer. He is voiding well. He has no erections and is not concerned about that.     CC: AUA Questions Scoring.  HPI:     AUA Symptom Score: He never has the sensation of not emptying his bladder completely after finishing urinating. 50% of the time he has to urinate again fewer than two hours after he has finished urinating. He does not have to stop and start again several times when he urinates. Less than 20% of the time he finds it difficult to postpone urination. He never has a weak urinary stream. He never has to push or strain to begin urination. He has to get up to urinate 2 times from the time he goes to bed until the time he gets up in the morning.   Calculated AUA Symptom Score: 6    ALLERGIES: Ambien TABS Augmentin Cymbalta CPEP Demerol TABS fentanyl Lidocaine OINT Lyrica CAPS Methadone HCl TABS Novocain  SOLN OxyCONTIN TB12 Says Opiates Don't Work On Him Xylocaine SOLN    MEDICATIONS: Adult Aspirin Low Strength 81 MG TBDP Oral  Atorvastatin Calcium 40 mg tablet  Benadryl CAPS Oral  Clonazepam  CPAP  Depo-Testosterone 200 MG/ML OIL Intramuscular  Dilaudid 2 mg tablet  GuaiFENesin 400 MG Oral Tablet Oral  Isosorbide Mononitrate Er 120 mg tablet, extended release 24 hr tablet  Isosorbide Mononitrate Er 30 mg tablet, extended release 24 hr  Lasix 40 MG Oral Tablet Oral  L-Lysine TABS Oral  MiraLax Oral Packet Oral  Naproxen  Nitrostat 0.4 MG Sublingual Tablet Sublingual Sublingual  Potassium Chloride ER 10 MEQ Oral Capsule Extended Release Oral  PriLOSEC 40 MG CPDR Oral  Toprol Xl 50 mg tablet, extended release 24 hr tablet  Valacyclovir  Vitamin C TABS Oral  Vitamin D TABS Oral     GU PSH: Biopsy Penis - 2013 Cysto Dilate Stricture (M or F) - 2013 Urethral Meatotomy - 2012       PSH Notes: Cystoscopy For Urethral Stricture, Biopsy Penis Cutaneous, Heart Surgery, Oral Surgery, Hand Surgery, Breast Surgery Mastectomy, Tonsillectomy, Appendectomy, Urethral Meatotomy, Elective Circumcision, Hernia Repair, BXO surgery 2014. lt wrist fusion   NON-GU PSH: Appendectomy - 2012 Hernia Repair - 2012 Mastectomy, Simple, Complete, Bilateral Remove Tonsils - 2012  GU PMH: ED due to arterial insufficiency, He is not interested in therapy. - 2018 Balanitis Xerotica Obliterans (Lichen sclerosis), Balanitis xerotica obliterans - 2014 BPH w/LUTS, Benign prostatic hyperplasia with urinary obstruction - 2014 Renal calculus      PMH Notes:  1898-05-12 00:00:00 - Note: Normal Routine History And Physical Adult  2011-04-29 15:31:16 - Note: Hearing Loss  2011-04-29 15:31:16 - Note: Sjogren's Syndrome  2011-05-22 11:40:03 - Note: Penile Lesion Single  sexually transmitted disease   NON-GU PMH: Fibromyalgia, Fibromyalgia - 2014 Personal history of other diseases of the circulatory  system, History of hypertension - 2014 Personal history of other diseases of the nervous system and sense organs, History of peripheral neuropathy - 2014 Personal history of other specified conditions, History of heartburn - 2014 Anxiety GERD Hypertension Myocardial Infarction Sleep Apnea    FAMILY HISTORY: 1 Daughter - Runs in Family 1 son - Runs in Family Death In The Family Father - Runs In Family Death In The Family Mother - Mother Family Health Status Number - Runs In Family Heart Attack - Mother Heart Disease - Father, Mother   SOCIAL HISTORY: Marital Status: Single Preferred Language: English; Race: White Current Smoking Status: Patient does not smoke anymore. Has not smoked since 09/09/2000.   Tobacco Use Assessment Completed: Used Tobacco in last 30 days? Drinks 4+ caffeinated drinks per day. Patient's occupation is/was retired.     Notes: Tobacco use, Recovering Alcoholic, Marital History - Currently Married, Occupation:, Caffeine Use   REVIEW OF SYSTEMS:    GU Review Male:   Patient reports get up at night to urinate and penile pain. Patient denies hard to postpone urination, frequent urination, trouble starting your stream, erection problems, leakage of urine, have to strain to urinate , stream starts and stops, and burning/ pain with urination.  Gastrointestinal (Upper):   Patient reports nausea. Patient denies vomiting and indigestion/ heartburn.  Gastrointestinal (Lower):   Patient reports diarrhea. Patient denies constipation.  Constitutional:   Patient reports fatigue. Patient denies fever, night sweats, and weight loss.  Skin:   Patient reports itching. Patient denies skin rash/ lesion.  Eyes:   Patient denies blurred vision and double vision.  Ears/ Nose/ Throat:   Patient reports sinus problems. Patient denies sore throat.  Hematologic/Lymphatic:   Patient denies swollen glands and easy bruising.  Cardiovascular:   Patient denies leg swelling and chest pains.   Respiratory:   Patient reports shortness of breath. Patient denies cough.  Endocrine:   Patient denies excessive thirst.  Musculoskeletal:   Patient reports joint pain. Patient denies back pain.  Neurological:   Patient denies headaches and dizziness.  Psychologic:   Patient denies depression and anxiety.   VITAL SIGNS:      09/26/2022 11:20 AM  Weight 235 lb / 106.59 kg  Height 68 in / 172.72 cm  BP 103/68 mmHg  Pulse 65 /min  BMI 35.7 kg/m   GU PHYSICAL EXAMINATION:    Scrotum: No lesions. No edema. No cysts. No warts.  Epididymides: Right: no spermatocele, no masses, no cysts, no tenderness, no induration, no enlargement. Left: no spermatocele, no masses, no cysts, no tenderness, no induration, no enlargement.  Testes: No tenderness, no swelling, no enlargement left testes. No tenderness, no swelling, no enlargement right testes. Normal location left testes. Normal location right testes. No mass, no cyst, no varicocele, no hydrocele left testes. No mass, no cyst, no varicocele, no hydrocele right testes.  Urethral Meatus: acquired, Coronal hypospadias .   Penis: Circumcised,  with a 3x60mm ulcerative lesion on the glans and a few other non ulcerative lesions on the penis and around the neomeatus which is located in the coronal sulcus.    MULTI-SYSTEM PHYSICAL EXAMINATION:    Constitutional: Obese. No physical deformities. Normally developed. Good grooming.   Neck: Neck symmetrical, not swollen. Normal tracheal position.  Respiratory: Normal breath sounds. No labored breathing, no use of accessory muscles.   Cardiovascular: Regular rate and rhythm. No murmur, no gallop.   Lymphatic: No enlargement, no tenderness of groin lymph nodes.  Skin: No paleness, no jaundice, no cyanosis. No lesion, no ulcer, no rash.  Neurologic / Psychiatric: Oriented to time, oriented to place, oriented to person. No depression, no anxiety, no agitation. unsteadiness in the legs.  Gastrointestinal: Obese  abdomen. No hernia. No mass, no tenderness, no rigidity.   Eyes: Normal conjunctivae. Normal eyelids.  Ears, Nose, Mouth, and Throat: Left ear no scars, no lesions, no masses. Right ear no scars, no lesions, no masses. Nose no scars, no lesions, no masses. Normal hearing. Normal lips.  Musculoskeletal: Normal gait and station of head and neck.     Complexity of Data:  Lab Test Review:   CBC with Diff  Records Review:   Previous Patient Records  Urine Test Review:   Urinalysis  Notes:                     His platelet count is 125k on recent labs.    PROCEDURES:          Urinalysis - 81003 Dipstick Dipstick Cont'd  Color: Straw Bilirubin: Neg mg/dL  Appearance: Clear Ketones: Neg mg/dL  Specific Gravity: 1.610 Blood: Neg ery/uL  pH: <=5.0 Protein: Neg mg/dL  Glucose: Neg mg/dL Urobilinogen: 0.2 mg/dL    Nitrites: Neg    Leukocyte Esterase: Neg leu/uL    ASSESSMENT:      ICD-10 Details  1 GU:   Ulcer of penis - N48.5 Undiagnosed New Problem - He has a non-healing ulcer of the glans and needs a biopsy. I reviewed the risks of bleeding, infection, healing issues, thrombotic events and anesthetic complications.   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 I recommended he see about getting an estradiol level checked.    PLAN:           Schedule Return Visit/Planned Activity: Next Available Appointment - Schedule Surgery  Procedure: Unspecified Date - Deep Biopsy Penis - 54105 Notes: Next available

## 2022-10-23 DIAGNOSIS — E291 Testicular hypofunction: Secondary | ICD-10-CM | POA: Diagnosis not present

## 2022-10-23 DIAGNOSIS — E876 Hypokalemia: Secondary | ICD-10-CM | POA: Diagnosis not present

## 2022-10-23 DIAGNOSIS — I10 Essential (primary) hypertension: Secondary | ICD-10-CM | POA: Diagnosis not present

## 2022-10-23 DIAGNOSIS — Z125 Encounter for screening for malignant neoplasm of prostate: Secondary | ICD-10-CM | POA: Diagnosis not present

## 2022-10-23 DIAGNOSIS — I5032 Chronic diastolic (congestive) heart failure: Secondary | ICD-10-CM | POA: Diagnosis not present

## 2022-10-23 DIAGNOSIS — R947 Abnormal results of other endocrine function studies: Secondary | ICD-10-CM | POA: Diagnosis not present

## 2022-10-23 DIAGNOSIS — D696 Thrombocytopenia, unspecified: Secondary | ICD-10-CM | POA: Diagnosis not present

## 2022-10-24 ENCOUNTER — Other Ambulatory Visit: Payer: Self-pay

## 2022-10-24 ENCOUNTER — Ambulatory Visit (HOSPITAL_COMMUNITY): Payer: Medicare PPO | Admitting: Physician Assistant

## 2022-10-24 ENCOUNTER — Encounter (HOSPITAL_COMMUNITY)
Admission: RE | Disposition: A | Discharge status: Home / Self Care | Payer: Self-pay | Source: Ambulatory Visit | Attending: Urology

## 2022-10-24 ENCOUNTER — Ambulatory Visit (HOSPITAL_BASED_OUTPATIENT_CLINIC_OR_DEPARTMENT_OTHER): Payer: Medicare PPO | Admitting: Anesthesiology

## 2022-10-24 ENCOUNTER — Encounter (HOSPITAL_COMMUNITY): Payer: Self-pay | Admitting: Urology

## 2022-10-24 ENCOUNTER — Ambulatory Visit (HOSPITAL_COMMUNITY)
Admission: RE | Admit: 2022-10-24 | Discharge: 2022-10-24 | Disposition: A | Discharge status: Home / Self Care | Payer: Medicare PPO | Source: Ambulatory Visit | Attending: Urology | Admitting: Urology

## 2022-10-24 DIAGNOSIS — M797 Fibromyalgia: Secondary | ICD-10-CM | POA: Insufficient documentation

## 2022-10-24 DIAGNOSIS — I509 Heart failure, unspecified: Secondary | ICD-10-CM

## 2022-10-24 DIAGNOSIS — I110 Hypertensive heart disease with heart failure: Secondary | ICD-10-CM

## 2022-10-24 DIAGNOSIS — N4889 Other specified disorders of penis: Secondary | ICD-10-CM | POA: Insufficient documentation

## 2022-10-24 DIAGNOSIS — N289 Disorder of kidney and ureter, unspecified: Secondary | ICD-10-CM | POA: Insufficient documentation

## 2022-10-24 DIAGNOSIS — N480 Leukoplakia of penis: Secondary | ICD-10-CM | POA: Diagnosis not present

## 2022-10-24 DIAGNOSIS — G4730 Sleep apnea, unspecified: Secondary | ICD-10-CM | POA: Diagnosis not present

## 2022-10-24 DIAGNOSIS — Z87891 Personal history of nicotine dependence: Secondary | ICD-10-CM

## 2022-10-24 DIAGNOSIS — E291 Testicular hypofunction: Secondary | ICD-10-CM | POA: Diagnosis not present

## 2022-10-24 DIAGNOSIS — K219 Gastro-esophageal reflux disease without esophagitis: Secondary | ICD-10-CM | POA: Insufficient documentation

## 2022-10-24 DIAGNOSIS — Z955 Presence of coronary angioplasty implant and graft: Secondary | ICD-10-CM | POA: Diagnosis not present

## 2022-10-24 DIAGNOSIS — I25119 Atherosclerotic heart disease of native coronary artery with unspecified angina pectoris: Secondary | ICD-10-CM | POA: Insufficient documentation

## 2022-10-24 DIAGNOSIS — N4829 Other inflammatory disorders of penis: Secondary | ICD-10-CM | POA: Diagnosis not present

## 2022-10-24 DIAGNOSIS — Z8249 Family history of ischemic heart disease and other diseases of the circulatory system: Secondary | ICD-10-CM | POA: Diagnosis not present

## 2022-10-24 DIAGNOSIS — N485 Ulcer of penis: Secondary | ICD-10-CM

## 2022-10-24 DIAGNOSIS — F419 Anxiety disorder, unspecified: Secondary | ICD-10-CM | POA: Diagnosis not present

## 2022-10-24 DIAGNOSIS — I252 Old myocardial infarction: Secondary | ICD-10-CM | POA: Insufficient documentation

## 2022-10-24 HISTORY — PX: PENILE BIOPSY: SHX6013

## 2022-10-24 SURGERY — BIOPSY, PENIS
Anesthesia: General

## 2022-10-24 MED ORDER — FENTANYL CITRATE (PF) 100 MCG/2ML IJ SOLN
INTRAMUSCULAR | Status: AC
  Filled 2022-10-24: qty 2

## 2022-10-24 MED ORDER — HYDROMORPHONE HCL 1 MG/ML IJ SOLN: 0.2500 mg | INTRAMUSCULAR | Status: DC | PRN

## 2022-10-24 MED ORDER — LIDOCAINE HCL 1 % IJ SOLN
INTRAMUSCULAR | Status: AC
  Filled 2022-10-24: qty 20

## 2022-10-24 MED ORDER — SODIUM CHLORIDE 0.9% FLUSH: 3.0000 mL | Freq: Two times a day (BID) | INTRAVENOUS | Status: DC

## 2022-10-24 MED ORDER — MORPHINE SULFATE (PF) 2 MG/ML IV SOLN
2.0000 mg | INTRAVENOUS | Status: DC | PRN
Start: 2022-10-24 — End: 2022-10-24

## 2022-10-24 MED ORDER — BACITRACIN-NEOMYCIN-POLYMYXIN OINTMENT TUBE
TOPICAL_OINTMENT | CUTANEOUS | Status: AC
  Filled 2022-10-24: qty 14.17

## 2022-10-24 MED ORDER — MIDAZOLAM HCL 2 MG/2ML IJ SOLN
INTRAMUSCULAR | Status: DC | PRN
  Administered 2022-10-24: 2 mg via INTRAVENOUS

## 2022-10-24 MED ORDER — ONDANSETRON HCL 4 MG/2ML IJ SOLN
INTRAMUSCULAR | Status: DC | PRN
  Administered 2022-10-24: 4 mg via INTRAVENOUS

## 2022-10-24 MED ORDER — BACITRACIN-NEOMYCIN-POLYMYXIN OINTMENT TUBE
TOPICAL_OINTMENT | CUTANEOUS | Status: DC | PRN
  Administered 2022-10-24: 1 via TOPICAL

## 2022-10-24 MED ORDER — LIDOCAINE HCL (PF) 2 % IJ SOLN
INTRAMUSCULAR | Status: AC
  Filled 2022-10-24: qty 5

## 2022-10-24 MED ORDER — PROMETHAZINE HCL 25 MG/ML IJ SOLN: 6.2500 mg | INTRAMUSCULAR | Status: DC | PRN

## 2022-10-24 MED ORDER — 0.9 % SODIUM CHLORIDE (POUR BTL) OPTIME
TOPICAL | Status: DC | PRN
  Administered 2022-10-24: 1000 mL

## 2022-10-24 MED ORDER — CHLORHEXIDINE GLUCONATE 0.12 % MT SOLN
15.0000 mL | Freq: Once | OROMUCOSAL | Status: AC
  Administered 2022-10-24: 15 mL via OROMUCOSAL

## 2022-10-24 MED ORDER — LACTATED RINGERS IV SOLN: INTRAVENOUS | Status: DC

## 2022-10-24 MED ORDER — HYDROMORPHONE HCL 2 MG PO TABS: 2.0000 mg | ORAL_TABLET | ORAL | 0 refills | Status: DC | PRN

## 2022-10-24 MED ORDER — LIDOCAINE-EPINEPHRINE (PF) 1 %-1:200000 IJ SOLN
INTRAMUSCULAR | Status: DC | PRN
  Administered 2022-10-24: 3 mL

## 2022-10-24 MED ORDER — CIPROFLOXACIN IN D5W 400 MG/200ML IV SOLN
400.0000 mg | INTRAVENOUS | Status: AC
  Administered 2022-10-24: 400 mg via INTRAVENOUS
  Filled 2022-10-24: qty 200

## 2022-10-24 MED ORDER — FENTANYL CITRATE (PF) 100 MCG/2ML IJ SOLN
INTRAMUSCULAR | Status: DC | PRN
  Administered 2022-10-24 (×4): 50 ug via INTRAVENOUS

## 2022-10-24 MED ORDER — ONDANSETRON HCL 4 MG/2ML IJ SOLN
INTRAMUSCULAR | Status: AC
  Filled 2022-10-24: qty 2

## 2022-10-24 MED ORDER — PROPOFOL 10 MG/ML IV BOLUS
INTRAVENOUS | Status: DC | PRN
  Administered 2022-10-24: 150 mg via INTRAVENOUS

## 2022-10-24 MED ORDER — AMISULPRIDE (ANTIEMETIC) 5 MG/2ML IV SOLN: 10.0000 mg | Freq: Once | INTRAVENOUS | Status: DC | PRN

## 2022-10-24 MED ORDER — EPHEDRINE SULFATE-NACL 50-0.9 MG/10ML-% IV SOSY
PREFILLED_SYRINGE | INTRAVENOUS | Status: DC | PRN
  Administered 2022-10-24: 7.5 mg via INTRAVENOUS

## 2022-10-24 MED ORDER — DEXAMETHASONE SODIUM PHOSPHATE 10 MG/ML IJ SOLN
INTRAMUSCULAR | Status: AC
  Filled 2022-10-24: qty 1

## 2022-10-24 MED ORDER — LIDOCAINE-EPINEPHRINE (PF) 1 %-1:200000 IJ SOLN
INTRAMUSCULAR | Status: AC
  Filled 2022-10-24: qty 30

## 2022-10-24 MED ORDER — BUPIVACAINE HCL (PF) 0.25 % IJ SOLN
INTRAMUSCULAR | Status: AC
  Filled 2022-10-24: qty 30

## 2022-10-24 MED ORDER — ORAL CARE MOUTH RINSE: 15.0000 mL | Freq: Once | OROMUCOSAL | Status: AC

## 2022-10-24 MED ORDER — PROPOFOL 10 MG/ML IV BOLUS
INTRAVENOUS | Status: AC
  Filled 2022-10-24: qty 20

## 2022-10-24 MED ORDER — MIDAZOLAM HCL 2 MG/2ML IJ SOLN
INTRAMUSCULAR | Status: AC
  Filled 2022-10-24: qty 2

## 2022-10-24 MED ORDER — HYDROCODONE-ACETAMINOPHEN 5-325 MG PO TABS: 1.0000 | ORAL_TABLET | Freq: Four times a day (QID) | ORAL | Status: DC | PRN

## 2022-10-24 SURGICAL SUPPLY — 32 items
BAG COUNTER SPONGE SURGICOUNT (BAG) IMPLANT
BAG SPNG CNTER NS LX DISP (BAG)
BLADE SURG 15 STRL LF DISP TIS (BLADE) ×1 IMPLANT
BLADE SURG 15 STRL SS (BLADE) ×1
BNDG CMPR 75X21 PLY HI ABS (MISCELLANEOUS) ×1
BNDG COHESIVE 1X5 TAN STRL LF (GAUZE/BANDAGES/DRESSINGS) ×1 IMPLANT
COVER SURGICAL LIGHT HANDLE (MISCELLANEOUS) ×1 IMPLANT
DRAPE LAPAROTOMY T 98X78 PEDS (DRAPES) ×1 IMPLANT
DRSG TELFA 3X8 NADH STRL (GAUZE/BANDAGES/DRESSINGS) IMPLANT
ELECT NDL TIP 2.8 STRL (NEEDLE) ×1 IMPLANT
ELECT NEEDLE TIP 2.8 STRL (NEEDLE) ×1 IMPLANT
ELECT PENCIL ROCKER SW 15FT (MISCELLANEOUS) ×1 IMPLANT
ELECT REM PT RETURN 15FT ADLT (MISCELLANEOUS) ×1 IMPLANT
GAUZE 4X4 16PLY ~~LOC~~+RFID DBL (SPONGE) ×1 IMPLANT
GAUZE SPONGE 4X4 12PLY STRL (GAUZE/BANDAGES/DRESSINGS) ×1 IMPLANT
GAUZE STRETCH 2X75IN STRL (MISCELLANEOUS) ×1 IMPLANT
GAUZE XEROFORM 1X8 LF (GAUZE/BANDAGES/DRESSINGS) ×2 IMPLANT
GLOVE SURG SS PI 8.0 STRL IVOR (GLOVE) IMPLANT
GOWN STRL REUS W/ TWL XL LVL3 (GOWN DISPOSABLE) ×1 IMPLANT
GOWN STRL REUS W/TWL XL LVL3 (GOWN DISPOSABLE) ×1
KIT BASIN OR (CUSTOM PROCEDURE TRAY) ×1 IMPLANT
KIT TURNOVER KIT A (KITS) IMPLANT
NDL HYPO 25X1 1.5 SAFETY (NEEDLE) IMPLANT
NEEDLE HYPO 25X1 1.5 SAFETY (NEEDLE) IMPLANT
NS IRRIG 1000ML POUR BTL (IV SOLUTION) IMPLANT
PACK BASIC VI WITH GOWN DISP (CUSTOM PROCEDURE TRAY) ×1 IMPLANT
SPIKE FLUID TRANSFER (MISCELLANEOUS) IMPLANT
SUT CHROMIC 4 0 PS 2 18 (SUTURE) ×2 IMPLANT
SUT MNCRL AB 4-0 PS2 18 (SUTURE) IMPLANT
SYR CONTROL 10ML LL (SYRINGE) IMPLANT
TOWEL OR 17X26 10 PK STRL BLUE (TOWEL DISPOSABLE) ×1 IMPLANT
WATER STERILE IRR 1000ML POUR (IV SOLUTION) IMPLANT

## 2022-10-24 NOTE — Discharge Instructions (Signed)
Apply antibiotic ointment to the wound 2-3x daily.

## 2022-10-24 NOTE — Anesthesia Postprocedure Evaluation (Signed)
Anesthesia Post Note  Patient: [name]  Procedure(s) Performed: EXCISIONAL PENILE BIOPSY OF ULCER     Patient location during evaluation: PACU Anesthesia Type: General Level of consciousness: awake and alert Pain management: pain level controlled Vital Signs Assessment: post-procedure vital signs reviewed and stable Respiratory status: spontaneous breathing, nonlabored ventilation and respiratory function stable Cardiovascular status: blood pressure returned to baseline and stable Postop Assessment: no apparent nausea or vomiting Anesthetic complications: no   No notable events documented.  Last Vitals:  Vitals:   10/24/22 0844 10/24/22 0845  BP:  132/85  Pulse: 63 60  Resp: 14 16  Temp:  (!) 36.3 C  SpO2: 100% 100%    Last Pain:  Vitals:   10/24/22 0845  TempSrc: Oral  PainSc: 0-No pain                 Lowella Curb

## 2022-10-24 NOTE — Transfer of Care (Signed)
Immediate Anesthesia Transfer of Care Note  Patient: [name]  Procedure(s) Performed: EXCISIONAL PENILE BIOPSY OF ULCER  Patient Location: PACU  Anesthesia Type:General  Level of Consciousness: awake and patient cooperative  Airway & Oxygen Therapy: Patient Spontanous Breathing and Patient connected to face mask  Post-op Assessment: Report given to RN and Post -op Vital signs reviewed and stable  Post vital signs: Reviewed and stable  Last Vitals:  Vitals Value Taken Time  BP 126/65 10/24/22 0809  Temp    Pulse 68 10/24/22 0811  Resp 13 10/24/22 0811  SpO2 98 % 10/24/22 0811  Vitals shown include unvalidated device data.  Last Pain:  Vitals:   10/24/22 0612  TempSrc: Oral         Complications: No notable events documented.

## 2022-10-24 NOTE — Op Note (Signed)
Procedure: Excisional biopsy of penile ulcer.  Pre-Op diagnosis: Penile ulcer of the glans penis with history of BX so.  Postop diagnosis: Same.  Surgeon: Dr. Bjorn Pippin.  Anesthesia: General.  Specimen: Excisional biopsy of penile ulcer.  EBL: 2 mL.  Complications: None.  Indications: The patient is a 72 year old male with a history of balanitis xerotica obliterans who had previously undergone reconstructive surgery.  He returned to the office with a new complaint of a bleeding ulcer of the glans penis and after examination it was felt that biopsy was indicated.  Procedure: He was taken the operating room was given antibiotics.  A general anesthetic was induced.  He was left in the supine position and fitted with PAS hose.  His genitalia was prepped with Betadine solution and draped in usual sterile fashion.  On inspection there was approximately a 5 mm ulcer on the glans penis distally but there was desquamation for another 1 cm more proximally so the total lesion measured 1 x 1.5 cm.  The glans was infiltrated with approximately 3 mL of 1% lidocaine with epinephrine to reduce bleeding.  The lesion was then excised in an elliptical fashion with a knife down into the corpus spongiosum.  The specimen was passed off to be sent to pathology.  Hemostasis was then achieved using the Bovie on low power.  The wound was then closed using interrupted 4-0 Monocryl sutures.  A dressing of antibiotic ointment, Telfa, 4 x 4's and mesh panties was applied.  His anesthetic was reversed and he was moved recovery room in stable condition.  There were no complications.

## 2022-10-24 NOTE — Anesthesia Procedure Notes (Signed)
Procedure Name: LMA Insertion Date/Time: 10/24/2022 7:30 AM  Performed by: Vanessa Screven, CRNAPre-anesthesia Checklist: Emergency Drugs available, Patient identified, Suction available and Patient being monitored Patient Re-evaluated:Patient Re-evaluated prior to induction Oxygen Delivery Method: Circle system utilized Preoxygenation: Pre-oxygenation with 100% oxygen Induction Type: IV induction Ventilation: Mask ventilation without difficulty LMA: LMA inserted LMA Size: 4.0 Number of attempts: 1 Placement Confirmation: positive ETCO2 and breath sounds checked- equal and bilateral Tube secured with: Tape Dental Injury: Teeth and Oropharynx as per pre-operative assessment

## 2022-10-24 NOTE — Interval H&P Note (Signed)
History and Physical Interval Note:  10/24/2022 7:14 AM  [name]  has presented today for surgery, with the diagnosis of PENILE ULCER.  The various methods of treatment have been discussed with the patient and family. After consideration of risks, benefits and other options for treatment, the patient has consented to  Procedure(s) with comments: EXCISIONAL PENILE BIOPSY OF ULCER (N/A) - 45 MINS FOR CASE as a surgical intervention.  The patient's history has been reviewed, patient examined, no change in status, stable for surgery.  I have reviewed the patient's chart and labs.  Questions were answered to the patient's satisfaction.     Bjorn Pippin

## 2022-10-25 ENCOUNTER — Encounter (HOSPITAL_COMMUNITY): Payer: Self-pay | Admitting: Urology

## 2022-10-27 LAB — SURGICAL PATHOLOGY

## 2022-11-14 DIAGNOSIS — N480 Leukoplakia of penis: Secondary | ICD-10-CM | POA: Diagnosis not present

## 2022-11-14 DIAGNOSIS — N485 Ulcer of penis: Secondary | ICD-10-CM | POA: Diagnosis not present

## 2022-12-24 DIAGNOSIS — E7849 Other hyperlipidemia: Secondary | ICD-10-CM | POA: Diagnosis not present

## 2022-12-24 DIAGNOSIS — E782 Mixed hyperlipidemia: Secondary | ICD-10-CM | POA: Diagnosis not present

## 2022-12-24 DIAGNOSIS — K219 Gastro-esophageal reflux disease without esophagitis: Secondary | ICD-10-CM | POA: Diagnosis not present

## 2022-12-24 DIAGNOSIS — E876 Hypokalemia: Secondary | ICD-10-CM | POA: Diagnosis not present

## 2022-12-24 DIAGNOSIS — D696 Thrombocytopenia, unspecified: Secondary | ICD-10-CM | POA: Diagnosis not present

## 2022-12-24 DIAGNOSIS — R739 Hyperglycemia, unspecified: Secondary | ICD-10-CM | POA: Diagnosis not present

## 2022-12-24 DIAGNOSIS — E291 Testicular hypofunction: Secondary | ICD-10-CM | POA: Diagnosis not present

## 2022-12-31 DIAGNOSIS — M797 Fibromyalgia: Secondary | ICD-10-CM | POA: Diagnosis not present

## 2022-12-31 DIAGNOSIS — I5032 Chronic diastolic (congestive) heart failure: Secondary | ICD-10-CM | POA: Diagnosis not present

## 2022-12-31 DIAGNOSIS — K219 Gastro-esophageal reflux disease without esophagitis: Secondary | ICD-10-CM | POA: Diagnosis not present

## 2022-12-31 DIAGNOSIS — I10 Essential (primary) hypertension: Secondary | ICD-10-CM | POA: Diagnosis not present

## 2022-12-31 DIAGNOSIS — E7849 Other hyperlipidemia: Secondary | ICD-10-CM | POA: Diagnosis not present

## 2022-12-31 DIAGNOSIS — J449 Chronic obstructive pulmonary disease, unspecified: Secondary | ICD-10-CM | POA: Diagnosis not present

## 2022-12-31 DIAGNOSIS — I2510 Atherosclerotic heart disease of native coronary artery without angina pectoris: Secondary | ICD-10-CM | POA: Diagnosis not present

## 2022-12-31 DIAGNOSIS — E291 Testicular hypofunction: Secondary | ICD-10-CM | POA: Diagnosis not present

## 2022-12-31 DIAGNOSIS — F419 Anxiety disorder, unspecified: Secondary | ICD-10-CM | POA: Diagnosis not present

## 2023-01-03 ENCOUNTER — Other Ambulatory Visit: Payer: Self-pay | Admitting: Student

## 2023-01-21 ENCOUNTER — Other Ambulatory Visit: Payer: Self-pay | Admitting: Cardiology

## 2023-01-21 ENCOUNTER — Other Ambulatory Visit: Payer: Self-pay | Admitting: Internal Medicine

## 2023-02-28 DIAGNOSIS — L259 Unspecified contact dermatitis, unspecified cause: Secondary | ICD-10-CM | POA: Diagnosis not present

## 2023-02-28 DIAGNOSIS — R030 Elevated blood-pressure reading, without diagnosis of hypertension: Secondary | ICD-10-CM | POA: Diagnosis not present

## 2023-02-28 DIAGNOSIS — Z6837 Body mass index (BMI) 37.0-37.9, adult: Secondary | ICD-10-CM | POA: Diagnosis not present

## 2023-03-18 ENCOUNTER — Ambulatory Visit: Payer: Medicare PPO | Attending: Internal Medicine | Admitting: Internal Medicine

## 2023-03-18 ENCOUNTER — Encounter: Payer: Self-pay | Admitting: Internal Medicine

## 2023-03-18 VITALS — BP 136/80 | HR 63 | Ht 68.0 in | Wt 249.2 lb

## 2023-03-18 DIAGNOSIS — I5032 Chronic diastolic (congestive) heart failure: Secondary | ICD-10-CM | POA: Diagnosis not present

## 2023-03-18 MED ORDER — NITROGLYCERIN 0.4 MG SL SUBL: SUBLINGUAL_TABLET | SUBLINGUAL | 3 refills | Status: DC

## 2023-03-18 NOTE — Progress Notes (Signed)
Cardiology Office Note  Date: 03/18/2023   ID: [name], DOB Sep 30, 1950, MRN [id number]  PCP:  Royann Shivers, PA-C  Cardiologist:  Marjo Bicker, MD Electrophysiologist:  None    History of Present Illness: [name] is a 72 y.o. male known to have CAD status post LAD PCI in 2010 and 2016 with repeat LHC in 2019 showing patent stents with residual jailed diagonal 70% and RV marginal branch 80% stenosis with normal LVEF, HLD, OSA on CPAP, diastolic CHF presented to cardiology clinic for follow-up visit.   Patient is here for follow-up visit with me. No interval ER visits or hospitalizations.  He had to take 2 SL NTG pills in the last 1 year for chest pain which resolved after taking nitroglycerin tablets.  He was overexerting, lifting weights when he had chest pressure.  Resolved with SL NTG 0.4 mg.  Can walk on level ground with no symptoms.  It was only with uphill he noticed DOE.  No orthopnea, PND.  No dizziness, syncope, palpitations.  No leg swelling.  He also had left ankle injury in the past underwent surgeries, noticed swelling mostly in the left ankle area.  Compliant with medications and has no side effects.   Past Medical History:  Diagnosis Date   Anxiety    HX PANIC ATTACKS   CHF (congestive heart failure) (HCC)    Chronic cough    Coronary artery disease    a. s/p DES to LAD and PTCA of D1 in 2010 b. cath in 2016 showing patent stent with 50% stenosis of jailed D1 c. 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 MI 2019   Neuropathy    Penile lesion    Sjogren's disease (HCC)    Sleep apnea    USES C-PAP    Past  Surgical History:  Procedure Laterality Date   BREAST SURGERY     BIL MASTECTOMY  FOR BENIGN LUMPS   CARDIAC CATHETERIZATION N/A 01/09/2015   Procedure: Left Heart Cath and Coronary Angiography;  Surgeon: Marykay Lex, MD;  Location: [facility] INVASIVE CV LAB;  Service: Cardiovascular;  Laterality: N/A;   CARPAL TUNNELL  BIL   CIRCUMCISION     CORONARY ANGIOPLASTY WITH STENT PLACEMENT  X1 2010   HERNIA REPAIR     ING HERNIA REPAIR   LEFT HEART CATH AND CORONARY ANGIOGRAPHY N/A 10/15/2017   Procedure: LEFT HEART CATH AND CORONARY ANGIOGRAPHY;  Surgeon: Swaziland, Peter M, MD;  Location: [facility] INVASIVE CV LAB;  Service: Cardiovascular;  Laterality: N/A;   LEFT HEART CATHETERIZATION WITH CORONARY ANGIOGRAM N/A 10/11/2013   Procedure: LEFT HEART CATHETERIZATION WITH CORONARY ANGIOGRAM;  Surgeon: Lesleigh Noe, MD;  Location: [facility] CATH LAB;  Service: Cardiovascular;  Laterality: N/A;   LEFT HEART CATHETERIZATION WITH CORONARY ANGIOGRAM N/A 06/21/2014   Procedure: LEFT HEART CATHETERIZATION WITH CORONARY ANGIOGRAM;  Surgeon: Lennette Bihari, MD;  Location: [facility] CATH LAB;  Service: Cardiovascular;  Laterality: N/A;   MEATOTOMY  X2   PENILE BIOPSY N/A 10/24/2022   Procedure: EXCISIONAL PENILE BIOPSY  OF ULCER;  Surgeon: Bjorn Pippin, MD;  Location: [facility] ORS;  Service: Urology;  Laterality: N/A;  45 MINS FOR CASE   PERCUTANEOUS CORONARY STENT INTERVENTION (PCI-S) N/A 06/23/2014   Procedure: PERCUTANEOUS CORONARY STENT INTERVENTION (PCI-S);  Surgeon: Corky Crafts, MD;  Location: [facility] CATH LAB;  Service: Cardiovascular;  Laterality: N/A;   VASECTOMY     WRIST FRACTURE SURGERY  L  X3 SURGERIES    Current Outpatient Medications  Medication Sig Dispense Refill   aspirin EC 81 MG tablet Take 81 mg by mouth at bedtime.      atorvastatin (LIPITOR) 40 MG tablet TAKE 1 TABLET EVERY EVENING 90 tablet 3   b complex vitamins tablet Take 1 tablet by mouth daily.     betamethasone valerate ointment (VALISONE) 0.1 % Apply 1  application topically 2 (two) times daily as needed (itching).     Butalbital-APAP-Caffeine 50-325-40 MG capsule Take 1-2 capsules by mouth as needed for headache. 30 capsule 0   Cholecalciferol (VITAMIN D) 2000 units tablet Take 2,000 Units by mouth daily.     citalopram (CELEXA) 20 MG tablet Take 20 mg by mouth daily.      clonazePAM (KLONOPIN) 0.5 MG tablet Take 1 tablet (0.5 mg total) by mouth See admin instructions. Take 1 or 2 tablets as needed for panick attacks 10 tablet 0   Coenzyme Q10 100 MG capsule Take 100 mg by mouth every other day.     diphenhydrAMINE (BENADRYL) 25 MG tablet Take 25-50 mg by mouth 2 (two) times daily as needed for itching.      fluticasone (CUTIVATE) 0.05 % cream Apply 1 application  topically 2 (two) times daily as needed (eczema). Applied to face and/or penis area(s)     furosemide (LASIX) 40 MG tablet Take 80 mg by mouth daily.     guaifenesin (HUMIBID E) 400 MG TABS tablet Take 800 mg by mouth daily as needed (for post nasal drip/cough).      HYDROmorphone (DILAUDID) 2 MG tablet Take 1 tablet (2 mg total) by mouth every 4 (four) hours as needed for severe pain. 10 tablet 0   ibuprofen (ADVIL,MOTRIN) 200 MG tablet Take 800 mg by mouth every 8 (eight) hours as needed for mild pain or moderate pain.     isosorbide mononitrate (IMDUR) 120 MG 24 hr tablet TAKE 1 TABLET EVERY DAY 90 tablet 3   isosorbide mononitrate (IMDUR) 30 MG 24 hr tablet TAKE 1 TABLET AT BEDTIME (IN ADDITION TO 120 MG TABLET IN THE MORNING) 90 tablet 3   L-Lysine 500 MG TABS Take 500 mg by mouth daily.     loperamide (IMODIUM A-D) 2 MG tablet Take 6 mg by mouth 4 (four) times daily as needed for diarrhea or loose stools.     metoprolol succinate (TOPROL-XL) 50 MG 24 hr tablet Take 1 tablet (50 mg total) by mouth daily. Take with or immediately following a meal. 90 tablet 3   Multiple Vitamins-Minerals (MULTIVITAMIN WITH MINERALS) tablet Take 1 tablet by mouth every evening.      naloxone (NARCAN)  nasal spray 4 mg/0.1 mL Place 0.4 mg into the nose once.     naproxen sodium (ALEVE) 220 MG tablet Take 220 mg by mouth daily as needed (pain).     nitroGLYCERIN (NITROSTAT) 0.4 MG SL tablet Take one every 5 minutes as needed for chest pain 25 tablet 3   NON FORMULARY at bedtime. Pt uses a CPAP nightly     nystatin cream (MYCOSTATIN) Apply  1 Application topically daily as needed (yeast).     omeprazole (PRILOSEC) 40 MG capsule Take 40 mg by mouth daily before breakfast.     polyvinyl alcohol-povidone (HYPOTEARS) 1.4-0.6 % ophthalmic solution Place 1-2 drops into both eyes daily as needed (dry eyes).     potassium chloride (MICRO-K) 10 MEQ CR capsule Take 10 mEq by mouth daily.     testosterone cypionate (DEPOTESTOSTERONE CYPIONATE) 200 MG/ML injection Inject 100 mg into the muscle every 7 (seven) days.     vitamin C (ASCORBIC ACID) 500 MG tablet Take 500 mg by mouth every morning.     No current facility-administered medications for this visit.   Allergies:  Bee venom, Duloxetine, Fentanyl, Lidocaine, Meperidine hcl, Methadone, Oxycodone, Oxycodone hcl, Pregabalin, Zolpidem, and Zolpidem tartrate   Social History: The patient  reports that he quit smoking about 22 years ago. His smoking use included cigarettes. He started smoking about 55 years ago. He has a 82.7 pack-year smoking history. He has never used smokeless tobacco. He reports that he does not drink alcohol and does not use drugs.   Family History: The patient's family history includes Heart disease in an other family member.   ROS:  Please see the history of present illness. Otherwise, complete review of systems is positive for none.  All other systems are reviewed and negative.   Physical Exam: VS:  BP 136/80 (BP Location: Right Arm, Patient Position: Sitting, Cuff Size: Large)   Pulse 63   Ht 5\' 8"  (1.727 m)   Wt 249 lb 3.2 oz (113 kg)   SpO2 98%   BMI 37.89 kg/m , BMI Body mass index is 37.89 kg/m.  Wt Readings from Last  3 Encounters:  03/18/23 249 lb 3.2 oz (113 kg)  10/24/22 235 lb (106.6 kg)  10/14/22 235 lb (106.6 kg)    General: Patient appears comfortable at rest. HEENT: Conjunctiva and lids normal, oropharynx clear with moist mucosa. Neck: Supple, no elevated JVP or carotid bruits, no thyromegaly. Lungs: Clear to auscultation, nonlabored breathing at rest. Cardiac: Regular rate and rhythm, no S3 or significant systolic murmur, no pericardial rub. Abdomen: Soft, nontender, no hepatomegaly, bowel sounds present, no guarding or rebound. Extremities: No pitting edema, distal pulses 2+. Skin: Warm and dry. Musculoskeletal: No kyphosis. Neuropsychiatric: Alert and oriented x3, affect grossly appropriate.  ECG:  An ECG dated 12/16/2021 was personally reviewed today and demonstrated:  Normal sinus rhythm with PACs.  Recent Labwork: 10/14/2022: BUN 16; Creatinine, Ser 1.09; Hemoglobin 14.1; Platelets 218; Potassium 4.4; Sodium 139     Component Value Date/Time   CHOL 168 10/11/2013 0357   TRIG 172 (H) 10/11/2013 0357   HDL 34 (L) 10/11/2013 0357   CHOLHDL 4.9 10/11/2013 0357   VLDL 34 10/11/2013 0357   LDLCALC 100 (H) 10/11/2013 0357    Other Studies Reviewed Today: Echo in 2016 - Left ventricle: The cavity size was normal. Wall thickness was    increased in a pattern of mild LVH. Systolic function was normal.    The estimated ejection fraction was in the range of 60% to 65%.    Wall motion was normal; there were no regional wall motion    abnormalities. Left ventricular diastolic function parameters    were normal.  - Aortic valve: Mildly calcified annulus. Trileaflet; mildly    thickened leaflets. Valve area (VTI): 2.64 cm^2. Valve area    (Vmax): 2.8 cm^2.  - Mitral valve: Mildly calcified annulus. Mildly thickened leaflets    .  -  Left atrium: The atrium was moderately dilated.  - Right atrium: The atrium was mildly dilated.  - Technically adequate study.   Cath in 2019 Ost 1st Diag to  1st Diag lesion is 70% stenosed. Ost 2nd Diag lesion is 50% stenosed. Acute Mrg lesion is 80% stenosed. Previously placed Prox LAD to Mid LAD stent (unknown type) is widely patent. The left ventricular systolic function is normal. LV end diastolic pressure is normal. The left ventricular ejection fraction is 55-65% by visual estimate.   1. Small vessel obstructive CAD with 70% ostial first diagonal ( jailed by stent) and 80% ostial RV marginal branch 2. Continued patency of stents in the LAD 3. Normal LV function 4. Normal LVEDP   Plan: really no change from last cardiac cath in August 2016. Recommend continued medical therapy.  Assessment and Plan:  #CAD status post LAD PCI in 2010 and 2016 with repeat LHC in 2019 showing patent stents with residual jailed diagonal 70% and RV marginal branch 80% stenosis with normal LVEF Plan -Had to take 2 SL NTG pills in the last 1 year, has stable angina. -Continue aspirin 81 mg once daily -Continue atorvastatin 40 mg nightly -Continue antianginal therapy with Imdur 120 mg in the a.m. and 30 mg in the p.m., metoprolol succinate 50 mg once daily. -SL NG 0.4 mg as needed chest pain -ER precautions for chest pain provided  #HLD, current at goal Plan -Continue atorvastatin 40 mg nightly, goal LDL less than 70, LDL 57 in 12/2022.  #Chronic diastolic CHF, currently compensated Plan -Continue p.o. Lasix 80 mg once daily.  #OSA on CPAP Plan -Continue CPAP.  I have spent a total of 30 minutes with patient reviewing chart, EKGs, labs and examining patient as well as establishing an assessment and plan that was discussed with the patient.     Medication Adjustments/Labs and Tests Ordered: Current medicines are reviewed at length with the patient today.  Concerns regarding medicines are outlined above.    Disposition:  Follow up  one year  Signed, Ashtynn Berke Verne Spurr, MD, 03/18/2023 1:41 PM    [facility] [address], [place], [place] [address]

## 2023-03-18 NOTE — Patient Instructions (Signed)

## 2023-03-26 DIAGNOSIS — E782 Mixed hyperlipidemia: Secondary | ICD-10-CM | POA: Diagnosis not present

## 2023-03-26 DIAGNOSIS — E7849 Other hyperlipidemia: Secondary | ICD-10-CM | POA: Diagnosis not present

## 2023-03-26 DIAGNOSIS — D696 Thrombocytopenia, unspecified: Secondary | ICD-10-CM | POA: Diagnosis not present

## 2023-03-26 DIAGNOSIS — E291 Testicular hypofunction: Secondary | ICD-10-CM | POA: Diagnosis not present

## 2023-03-26 DIAGNOSIS — K219 Gastro-esophageal reflux disease without esophagitis: Secondary | ICD-10-CM | POA: Diagnosis not present

## 2023-03-26 DIAGNOSIS — I10 Essential (primary) hypertension: Secondary | ICD-10-CM | POA: Diagnosis not present

## 2023-03-26 DIAGNOSIS — E875 Hyperkalemia: Secondary | ICD-10-CM | POA: Diagnosis not present

## 2023-03-26 DIAGNOSIS — E298 Other testicular dysfunction: Secondary | ICD-10-CM | POA: Diagnosis not present

## 2023-03-26 DIAGNOSIS — Z1159 Encounter for screening for other viral diseases: Secondary | ICD-10-CM | POA: Diagnosis not present

## 2023-03-26 DIAGNOSIS — Z131 Encounter for screening for diabetes mellitus: Secondary | ICD-10-CM | POA: Diagnosis not present

## 2023-04-02 DIAGNOSIS — I2510 Atherosclerotic heart disease of native coronary artery without angina pectoris: Secondary | ICD-10-CM | POA: Diagnosis not present

## 2023-04-02 DIAGNOSIS — E7849 Other hyperlipidemia: Secondary | ICD-10-CM | POA: Diagnosis not present

## 2023-04-02 DIAGNOSIS — F419 Anxiety disorder, unspecified: Secondary | ICD-10-CM | POA: Diagnosis not present

## 2023-04-02 DIAGNOSIS — K219 Gastro-esophageal reflux disease without esophagitis: Secondary | ICD-10-CM | POA: Diagnosis not present

## 2023-04-02 DIAGNOSIS — E782 Mixed hyperlipidemia: Secondary | ICD-10-CM | POA: Diagnosis not present

## 2023-04-02 DIAGNOSIS — I10 Essential (primary) hypertension: Secondary | ICD-10-CM | POA: Diagnosis not present

## 2023-04-02 DIAGNOSIS — E291 Testicular hypofunction: Secondary | ICD-10-CM | POA: Diagnosis not present

## 2023-04-02 DIAGNOSIS — J449 Chronic obstructive pulmonary disease, unspecified: Secondary | ICD-10-CM | POA: Diagnosis not present

## 2023-04-02 DIAGNOSIS — I5032 Chronic diastolic (congestive) heart failure: Secondary | ICD-10-CM | POA: Diagnosis not present

## 2023-04-02 DIAGNOSIS — M797 Fibromyalgia: Secondary | ICD-10-CM | POA: Diagnosis not present

## 2023-07-15 DIAGNOSIS — E782 Mixed hyperlipidemia: Secondary | ICD-10-CM | POA: Diagnosis not present

## 2023-07-15 DIAGNOSIS — R739 Hyperglycemia, unspecified: Secondary | ICD-10-CM | POA: Diagnosis not present

## 2023-07-15 DIAGNOSIS — E875 Hyperkalemia: Secondary | ICD-10-CM | POA: Diagnosis not present

## 2023-07-15 DIAGNOSIS — Z131 Encounter for screening for diabetes mellitus: Secondary | ICD-10-CM | POA: Diagnosis not present

## 2023-07-15 DIAGNOSIS — Z1159 Encounter for screening for other viral diseases: Secondary | ICD-10-CM | POA: Diagnosis not present

## 2023-07-15 DIAGNOSIS — E7849 Other hyperlipidemia: Secondary | ICD-10-CM | POA: Diagnosis not present

## 2023-07-15 DIAGNOSIS — I10 Essential (primary) hypertension: Secondary | ICD-10-CM | POA: Diagnosis not present

## 2023-07-22 DIAGNOSIS — J449 Chronic obstructive pulmonary disease, unspecified: Secondary | ICD-10-CM | POA: Diagnosis not present

## 2023-07-22 DIAGNOSIS — I5032 Chronic diastolic (congestive) heart failure: Secondary | ICD-10-CM | POA: Diagnosis not present

## 2023-07-22 DIAGNOSIS — K219 Gastro-esophageal reflux disease without esophagitis: Secondary | ICD-10-CM | POA: Diagnosis not present

## 2023-07-22 DIAGNOSIS — Z6837 Body mass index (BMI) 37.0-37.9, adult: Secondary | ICD-10-CM | POA: Diagnosis not present

## 2023-07-22 DIAGNOSIS — I10 Essential (primary) hypertension: Secondary | ICD-10-CM | POA: Diagnosis not present

## 2023-08-26 DIAGNOSIS — M1712 Unilateral primary osteoarthritis, left knee: Secondary | ICD-10-CM | POA: Diagnosis not present

## 2023-08-26 DIAGNOSIS — M25562 Pain in left knee: Secondary | ICD-10-CM | POA: Diagnosis not present

## 2023-09-01 ENCOUNTER — Encounter: Payer: Self-pay | Admitting: Internal Medicine

## 2023-09-01 ENCOUNTER — Telehealth: Payer: Self-pay | Admitting: Internal Medicine

## 2023-09-01 ENCOUNTER — Ambulatory Visit: Attending: Internal Medicine | Admitting: Internal Medicine

## 2023-09-01 VITALS — BP 130/78 | HR 58 | Ht 68.0 in | Wt 244.4 lb

## 2023-09-01 DIAGNOSIS — E785 Hyperlipidemia, unspecified: Secondary | ICD-10-CM

## 2023-09-01 DIAGNOSIS — R0602 Shortness of breath: Secondary | ICD-10-CM

## 2023-09-01 MED ORDER — RANOLAZINE ER 500 MG PO TB12: 500.0000 mg | ORAL_TABLET | Freq: Two times a day (BID) | ORAL | 5 refills | Status: DC

## 2023-09-01 NOTE — Progress Notes (Signed)
 Cardiology Office Note  Date: 09/01/2023   ID: [name], DOB Sep 09, 1950, MRN [id number]  PCP:  Lizabeth Riggs, PA-C  Cardiologist:  Lasalle Pointer, MD Electrophysiologist:  None    History of Present Illness: [name] is a 73 y.o. male known to have CAD status post LAD PCI in 2010 and 2016 with repeat LHC in 2019 showing patent stents with residual jailed diagonal 70% and RV marginal branch 80% stenosis with normal LVEF, HLD, OSA on CPAP, diastolic CHF presented to cardiology clinic for follow-up visit.   Last visit with me was from November 2024.  He reported new symptoms of DOE in the last 1 month.  He also has new exertional sharp chest pains radiating to his left shoulder (said his shoulder feels sore when he gets chest pains) lasting for couple of minutes and resolving with rest.  Initially less frequent but now occurring 3 times per week.  Currently on antianginal therapy, Imdur  120 mg in the a.m., 30 mg in the p.m., metoprolol  succinate 50 mg once daily.  No other symptoms like leg swelling, dizziness, palpitations or syncope were noted.   Past Medical History:  Diagnosis Date   Anxiety    HX PANIC ATTACKS   CHF (congestive heart failure) (HCC)    Chronic cough    Coronary artery disease    a. s/p DES to LAD and PTCA of D1 in 2010 b. cath in 2016 showing patent stent with 50% stenosis of jailed D1 c. 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 MI 2019   Neuropathy    Penile lesion    Sjogren's disease (HCC)    Sleep apnea    USES C-PAP    Past Surgical History:  Procedure Laterality Date   BREAST SURGERY     BIL  MASTECTOMY  FOR BENIGN LUMPS   CARDIAC CATHETERIZATION N/A 01/09/2015   Procedure: Left Heart Cath and Coronary Angiography;  Surgeon: Arleen Lacer, MD;  Location: [facility] INVASIVE CV LAB;  Service: Cardiovascular;  Laterality: N/A;   CARPAL TUNNELL  BIL   CIRCUMCISION     CORONARY ANGIOPLASTY WITH STENT PLACEMENT  X1 2010   HERNIA REPAIR     ING HERNIA REPAIR   LEFT HEART CATH AND CORONARY ANGIOGRAPHY N/A 10/15/2017   Procedure: LEFT HEART CATH AND CORONARY ANGIOGRAPHY;  Surgeon: Swaziland, Peter M, MD;  Location: [facility] INVASIVE CV LAB;  Service: Cardiovascular;  Laterality: N/A;   LEFT HEART CATHETERIZATION WITH CORONARY ANGIOGRAM N/A 10/11/2013   Procedure: LEFT HEART CATHETERIZATION WITH CORONARY ANGIOGRAM;  Surgeon: Mickiel Albany, MD;  Location: [facility] CATH LAB;  Service: Cardiovascular;  Laterality: N/A;   LEFT HEART CATHETERIZATION WITH CORONARY ANGIOGRAM N/A 06/21/2014   Procedure: LEFT HEART CATHETERIZATION WITH CORONARY ANGIOGRAM;  Surgeon: Millicent Ally, MD;  Location: [facility] CATH LAB;  Service: Cardiovascular;  Laterality: N/A;   MEATOTOMY  X2   PENILE BIOPSY N/A 10/24/2022   Procedure: EXCISIONAL PENILE BIOPSY OF ULCER;  Surgeon: Homero Luster, MD;  Location: [facility] ORS;  Service: Urology;  Laterality:  N/A;  45 MINS FOR CASE   PERCUTANEOUS CORONARY STENT INTERVENTION (PCI-S) N/A 06/23/2014   Procedure: PERCUTANEOUS CORONARY STENT INTERVENTION (PCI-S);  Surgeon: Lucendia Rusk, MD;  Location: [facility] CATH LAB;  Service: Cardiovascular;  Laterality: N/A;   VASECTOMY     WRIST FRACTURE SURGERY  L  X3 SURGERIES    Current Outpatient Medications  Medication Sig Dispense Refill   aspirin  EC 81 MG tablet Take 81 mg by mouth at bedtime.      atorvastatin  (LIPITOR) 40 MG tablet TAKE 1 TABLET EVERY EVENING 90 tablet 3   b complex vitamins tablet Take 1 tablet by mouth daily.     betamethasone valerate ointment (VALISONE) 0.1 % Apply 1 application topically 2 (two) times daily as needed (itching).      Butalbital -APAP-Caffeine  50-325-40 MG capsule Take 1-2 capsules by mouth as needed for headache. 30 capsule 0   Cholecalciferol  (VITAMIN D ) 2000 units tablet Take 2,000 Units by mouth daily.     citalopram  (CELEXA ) 20 MG tablet Take 20 mg by mouth daily.      clonazePAM  (KLONOPIN ) 0.5 MG tablet Take 1 tablet (0.5 mg total) by mouth See admin instructions. Take 1 or 2 tablets as needed for panick attacks 10 tablet 0   Coenzyme Q10 100 MG capsule Take 100 mg by mouth every other day.     diphenhydrAMINE  (BENADRYL ) 25 MG tablet Take 25-50 mg by mouth 2 (two) times daily as needed for itching.      fluticasone (CUTIVATE) 0.05 % cream Apply 1 application  topically 2 (two) times daily as needed (eczema). Applied to face and/or penis area(s)     furosemide  (LASIX ) 40 MG tablet Take 80 mg by mouth daily.     guaifenesin  (HUMIBID E) 400 MG TABS tablet Take 800 mg by mouth daily as needed (for post nasal drip/cough).      ibuprofen  (ADVIL ,MOTRIN ) 200 MG tablet Take 800 mg by mouth every 8 (eight) hours as needed for mild pain or moderate pain.     isosorbide  mononitrate (IMDUR ) 120 MG 24 hr tablet TAKE 1 TABLET EVERY DAY 90 tablet 3   isosorbide  mononitrate (IMDUR ) 30 MG 24 hr tablet TAKE 1 TABLET AT BEDTIME (IN ADDITION TO 120 MG TABLET IN THE MORNING) 90 tablet 3   L-Lysine 500 MG TABS Take 500 mg by mouth daily.     loperamide  (IMODIUM  A-D) 2 MG tablet Take 6 mg by mouth 4 (four) times daily as needed for diarrhea or loose stools.     metoprolol  succinate (TOPROL -XL) 50 MG 24 hr tablet Take 1 tablet (50 mg total) by mouth daily. Take with or immediately following a meal. 90 tablet 3   MISC NATURAL PRODUCTS PO Take by mouth. CBD gummies as needed for pain     Multiple Vitamins-Minerals (MULTIVITAMIN WITH MINERALS) tablet Take 1 tablet by mouth every evening.      naloxone (NARCAN) nasal spray 4 mg/0.1 mL Place 0.4 mg into the nose once.     naproxen sodium (ALEVE) 220 MG tablet Take 220 mg by mouth daily  as needed (pain).     nitroGLYCERIN  (NITROSTAT ) 0.4 MG SL tablet Take one every 5 minutes as needed for chest pain 25 tablet 3   NON FORMULARY at bedtime. Pt uses a CPAP nightly     nystatin cream (MYCOSTATIN) Apply 1 Application topically daily as needed (yeast).     omeprazole (PRILOSEC) 40 MG capsule Take 40 mg by mouth daily before breakfast.  polyvinyl alcohol -povidone (HYPOTEARS) 1.4-0.6 % ophthalmic solution Place 1-2 drops into both eyes daily as needed (dry eyes).     potassium chloride  (MICRO-K ) 10 MEQ CR capsule Take 10 mEq by mouth daily.     testosterone  cypionate (DEPOTESTOSTERONE CYPIONATE) 200 MG/ML injection Inject 100 mg into the muscle every 7 (seven) days.     vitamin C (ASCORBIC ACID ) 500 MG tablet Take 500 mg by mouth every morning.     No current facility-administered medications for this visit.   Allergies:  Bee venom, Duloxetine, Fentanyl , Lidocaine , Meperidine hcl, Methadone, Oxycodone , Oxycodone  hcl, Pregabalin, Zolpidem, and Zolpidem tartrate   Social History: The patient  reports that he quit smoking about 23 years ago. His smoking use included cigarettes. He started smoking about 56 years ago. He has a 82.7 pack-year smoking history. He has never used smokeless tobacco. He reports that he does not drink alcohol  and does not use drugs.   Family History: The patient's family history includes Heart disease in an other family member.   ROS:  Please see the history of present illness. Otherwise, complete review of systems is positive for none.  All other systems are reviewed and negative.   Physical Exam: VS:  BP 130/78   Pulse (!) 58   Ht 5\' 8"  (1.727 m)   Wt 244 lb 6.4 oz (110.9 kg)   SpO2 98%   BMI 37.16 kg/m , BMI Body mass index is 37.16 kg/m.  Wt Readings from Last 3 Encounters:  09/01/23 244 lb 6.4 oz (110.9 kg)  03/18/23 249 lb 3.2 oz (113 kg)  10/24/22 235 lb (106.6 kg)    General: Patient appears comfortable at rest. HEENT: Conjunctiva and  lids normal, oropharynx clear with moist mucosa. Neck: Supple, no elevated JVP or carotid bruits, no thyromegaly. Lungs: Clear to auscultation, nonlabored breathing at rest. Cardiac: Regular rate and rhythm, no S3 or significant systolic murmur, no pericardial rub. Abdomen: Soft, nontender, no hepatomegaly, bowel sounds present, no guarding or rebound. Extremities: No pitting edema, distal pulses 2+. Skin: Warm and dry. Musculoskeletal: No kyphosis. Neuropsychiatric: Alert and oriented x3, affect grossly appropriate.  ECG:  An ECG dated 12/16/2021 was personally reviewed today and demonstrated:  Normal sinus rhythm with PACs.  Recent Labwork: 10/14/2022: BUN 16; Creatinine, Ser 1.09; Hemoglobin 14.1; Platelets 218; Potassium 4.4; Sodium 139     Component Value Date/Time   CHOL 168 10/11/2013 0357   TRIG 172 (H) 10/11/2013 0357   HDL 34 (L) 10/11/2013 0357   CHOLHDL 4.9 10/11/2013 0357   VLDL 34 10/11/2013 0357   LDLCALC 100 (H) 10/11/2013 0357    Other Studies Reviewed Today: Echo in 2016 - Left ventricle: The cavity size was normal. Wall thickness was    increased in a pattern of mild LVH. Systolic function was normal.    The estimated ejection fraction was in the range of 60% to 65%.    Wall motion was normal; there were no regional wall motion    abnormalities. Left ventricular diastolic function parameters    were normal.  - Aortic valve: Mildly calcified annulus. Trileaflet; mildly    thickened leaflets. Valve area (VTI): 2.64 cm^2. Valve area    (Vmax): 2.8 cm^2.  - Mitral valve: Mildly calcified annulus. Mildly thickened leaflets    .  - Left atrium: The atrium was moderately dilated.  - Right atrium: The atrium was mildly dilated.  - Technically adequate study.   Cath in 2019 Ost 1st Diag to 1st Diag lesion is  70% stenosed. Ost 2nd Diag lesion is 50% stenosed. Acute Mrg lesion is 80% stenosed. Previously placed Prox LAD to Mid LAD stent (unknown type) is widely  patent. The left ventricular systolic function is normal. LV end diastolic pressure is normal. The left ventricular ejection fraction is 55-65% by visual estimate.   1. Small vessel obstructive CAD with 70% ostial first diagonal ( jailed by stent) and 80% ostial RV marginal branch 2. Continued patency of stents in the LAD 3. Normal LV function 4. Normal LVEDP   Plan: really no change from last cardiac cath in August 2016. Recommend continued medical therapy.  Assessment and Plan:  #CAD status post LAD PCI in 2010 and 2016 with repeat LHC in 2019 showing patent stents with residual jailed diagonal 70% and RV marginal branch 80% stenosis with normal LVEF -New symptoms of DOE and exertional chest pain radiating to his left shoulder in the last 4 to 6 weeks, initially less frequent and gradually increasing in intensity, currently chest pain's occurring 3 times per week.  Lasting for couple of minutes.  Obtain echocardiogram and Lexiscan .  Currently on Imdur  120 mg in the a.m., 30 mg in the p.m. and metoprolol  succinate 50 mg once daily.  Due to normal BP, will start ranolazine  500 mg twice daily for chest pain relief.  Continue cardioprotective medications, aspirin  81 mg once daily, atorvastatin  40 mg nightly.  SL NTG 0.4 mg as needed for chest pain.  ER precautions for chest pain provided.  #HLD, current at goal Plan - Continue atorvastatin  40 mg nightly, goal LDL 70.  LDL 57 in August 2024.  Repeat lipid panel.  #Chronic diastolic CHF, currently compensated Plan - Continue p.o. Lasix  80 mg once daily.  #OSA on CPAP Plan - Continue CPAP.   Medication Adjustments/Labs and Tests Ordered: Current medicines are reviewed at length with the patient today.  Concerns regarding medicines are outlined above.    Disposition:  Follow up 6 weeks  Signed, Jushua Waltman Beauford Bounds, MD, 09/01/2023 11:13 AM    [facility] at [facility] [address], [place], [place]  [address]

## 2023-09-01 NOTE — Telephone Encounter (Signed)
 Checking percert on the following patient for testing scheduled at [facility].   LEXISCAN    09/09/2023

## 2023-09-01 NOTE — Patient Instructions (Addendum)
 Medication Instructions:  Your physician has recommended you make the following change in your medication:  Start Ranexa  500 twice daily Continue taking all other medications as prescribed  Labwork: Lipid Panel Fasting at [facility] in [place] at [place]  Testing/Procedures: Your physician has requested that you have an echocardiogram. Echocardiography is a painless test that uses sound waves to create images of your heart. It provides your doctor with information about the size and shape of your heart and how well your heart's chambers and valves are working. This procedure takes approximately one hour. There are no restrictions for this procedure. Please do NOT wear cologne, perfume, aftershave, or lotions (deodorant is allowed). Please arrive 15 minutes prior to your appointment time.  Please note: We ask at that you not bring children with you during ultrasound (echo/ vascular) testing. Due to room size and safety concerns, children are not allowed in the ultrasound rooms during exams. Our front office staff cannot provide observation of children in our lobby area while testing is being conducted. An adult accompanying a patient to their appointment will only be allowed in the ultrasound room at the discretion of the ultrasound technician under special circumstances. We apologize for any inconvenience.  Your physician has requested that you have a lexiscan  myoview . For further information please visit [email or web address] Please follow instruction sheet, as given.   Follow-Up: Your physician recommends that you schedule a follow-up appointment in: 6 weeks  Any Other Special Instructions Will Be Listed Below (If Applicable). Thank you for choosing [facility]!     If you need a refill on your cardiac medications before your next appointment, please call your pharmacy.

## 2023-09-08 DIAGNOSIS — E785 Hyperlipidemia, unspecified: Secondary | ICD-10-CM | POA: Diagnosis not present

## 2023-09-09 ENCOUNTER — Ambulatory Visit (HOSPITAL_COMMUNITY)
Admission: RE | Admit: 2023-09-09 | Discharge: 2023-09-09 | Disposition: A | Discharge status: Home / Self Care | Source: Ambulatory Visit | Attending: Internal Medicine | Admitting: Internal Medicine

## 2023-09-09 ENCOUNTER — Encounter (HOSPITAL_COMMUNITY)
Admission: RE | Admit: 2023-09-09 | Discharge: 2023-09-09 | Disposition: A | Discharge status: Home / Self Care | Source: Ambulatory Visit | Attending: Internal Medicine | Admitting: Internal Medicine

## 2023-09-09 DIAGNOSIS — R0602 Shortness of breath: Secondary | ICD-10-CM | POA: Insufficient documentation

## 2023-09-09 LAB — NM MYOCAR MULTI W/SPECT W/WALL MOTION / EF
Estimated workload: 1
Exercise duration (min): 0 min
Exercise duration (sec): 0 s
LV dias vol: 109 mL (ref 62–150)
LV sys vol: 42 mL
MPHR: 148 {beats}/min
Nuc Stress EF: 61 %
Peak HR: 84 {beats}/min
Percent HR: 56 %
RATE: 0.3
Rest HR: 59 {beats}/min
Rest Nuclear Isotope Dose: 11 mCi
SDS: 2
SRS: 2
SSS: 4
ST Depression (mm): 0 mm
Stress Nuclear Isotope Dose: 33 mCi
TID: 1.17

## 2023-09-09 LAB — LIPID PANEL
Chol/HDL Ratio: 3.3 ratio (ref 0.0–5.0)
Cholesterol, Total: 126 mg/dL (ref 100–199)
HDL: 38 mg/dL — ABNORMAL LOW (ref >39)
LDL Chol Calc (NIH): 71 mg/dL (ref 0–99)
Triglycerides: 90 mg/dL (ref 0–149)
VLDL Cholesterol Cal: 17 mg/dL (ref 5–40)

## 2023-09-09 MED ORDER — TECHNETIUM TC 99M TETROFOSMIN IV KIT
10.0000 | PACK | Freq: Once | INTRAVENOUS | Status: AC | PRN
  Administered 2023-09-09: 11 via INTRAVENOUS

## 2023-09-09 MED ORDER — SODIUM CHLORIDE FLUSH 0.9 % IV SOLN
INTRAVENOUS | Status: AC
  Administered 2023-09-09: 10 mL via INTRAVENOUS
  Filled 2023-09-09: qty 10

## 2023-09-09 MED ORDER — REGADENOSON 0.4 MG/5ML IV SOLN
INTRAVENOUS | Status: AC
  Administered 2023-09-09: 0.4 mg via INTRAVENOUS
  Filled 2023-09-09: qty 5

## 2023-09-09 MED ORDER — TECHNETIUM TC 99M TETROFOSMIN IV KIT
30.0000 | PACK | Freq: Once | INTRAVENOUS | Status: AC | PRN
  Administered 2023-09-09: 33 via INTRAVENOUS

## 2023-09-15 ENCOUNTER — Telehealth: Payer: Self-pay

## 2023-09-15 ENCOUNTER — Ambulatory Visit: Attending: Internal Medicine

## 2023-09-15 DIAGNOSIS — R0602 Shortness of breath: Secondary | ICD-10-CM

## 2023-09-15 NOTE — Telephone Encounter (Signed)
 The patient has been notified of the result and verbalized understanding.  All questions (if any) were answered. Camilo Cella, [place] 09/15/2023 9:44 AM

## 2023-09-15 NOTE — Telephone Encounter (Signed)
-----   Message from [name] sent at 09/14/2023 10:13 AM EDT ----- Need to notify us  if his chest pains are worsening.  If severe chest pains, need to go to ER.

## 2023-09-16 LAB — ECHOCARDIOGRAM COMPLETE
AR max vel: 2.34 cm2
AV Area VTI: 2.38 cm2
AV Area mean vel: 2.28 cm2
AV Mean grad: 7 mmHg
AV Peak grad: 12.1 mmHg
Ao pk vel: 1.74 m/s
Area-P 1/2: 2.99 cm2
Calc EF: 63.1 %
MV VTI: 2.28 cm2
S' Lateral: 3.4 cm
Single Plane A2C EF: 59.7 %
Single Plane A4C EF: 65.2 %

## 2023-09-18 ENCOUNTER — Telehealth: Payer: Self-pay

## 2023-09-18 NOTE — Telephone Encounter (Signed)
-----   Message from Vishnu P Mallipeddi sent at 09/17/2023 12:42 PM EDT ----- Normal LVEF, normal RV function, no valvular heart disease, CVP 3 mmHg.  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

## 2023-09-18 NOTE — Telephone Encounter (Signed)
 The patient has been notified of the result and verbalized understanding.  All questions (if any) were answered. Will place order for CT Chest Aorta when he comes in for appointment in June. Camilo Cella, [place] 09/18/2023 9:58 AM

## 2023-09-24 ENCOUNTER — Ambulatory Visit: Payer: Self-pay | Admitting: Internal Medicine

## 2023-10-20 ENCOUNTER — Ambulatory Visit: Attending: Internal Medicine | Admitting: Internal Medicine

## 2023-10-20 ENCOUNTER — Encounter: Payer: Self-pay | Admitting: Internal Medicine

## 2023-10-20 VITALS — BP 120/70 | HR 77 | Ht 68.0 in | Wt 249.0 lb

## 2023-10-20 DIAGNOSIS — Z9861 Coronary angioplasty status: Secondary | ICD-10-CM | POA: Diagnosis not present

## 2023-10-20 DIAGNOSIS — I2510 Atherosclerotic heart disease of native coronary artery without angina pectoris: Secondary | ICD-10-CM

## 2023-10-20 DIAGNOSIS — I25118 Atherosclerotic heart disease of native coronary artery with other forms of angina pectoris: Secondary | ICD-10-CM

## 2023-10-20 DIAGNOSIS — Z955 Presence of coronary angioplasty implant and graft: Secondary | ICD-10-CM

## 2023-10-20 DIAGNOSIS — E785 Hyperlipidemia, unspecified: Secondary | ICD-10-CM | POA: Diagnosis not present

## 2023-10-20 DIAGNOSIS — I209 Angina pectoris, unspecified: Secondary | ICD-10-CM

## 2023-10-20 DIAGNOSIS — I5032 Chronic diastolic (congestive) heart failure: Secondary | ICD-10-CM | POA: Diagnosis not present

## 2023-10-20 DIAGNOSIS — G4733 Obstructive sleep apnea (adult) (pediatric): Secondary | ICD-10-CM

## 2023-10-20 MED ORDER — RANOLAZINE ER 1000 MG PO TB12: 1000.0000 mg | ORAL_TABLET | Freq: Two times a day (BID) | ORAL | 3 refills | Status: AC

## 2023-10-20 NOTE — Progress Notes (Signed)
 Cardiology Office Note  Date: 10/20/2023   ID: CASTLE LAMONS, DOB 1950/06/04, MRN [id number]  PCP:  Lizabeth Riggs, PA-C  Cardiologist:  Lasalle Pointer, MD Electrophysiologist:  None    History of Present Illness: [name] is a 73 y.o. male known to have CAD status post LAD PCI in 2010 and 2016 with repeat LHC in 2019 showing patent stents with residual jailed diagonal 70% and RV marginal branch 80% stenosis with normal LVEF, HLD, OSA on CPAP, diastolic CHF presented to cardiology clinic for follow-up visit.   New symptoms of DOE since March 2025.  He also developed new exertional chest pains occasionally radiating to his left shoulder lasting for about 1 to 2 minutes, waking him up from sleep once a week and occurring few times per week since March 2025.  I started him on Ranexa  500 mg twice daily in April 2025 after which he noticed improvement in his energy levels but continued to have similar chest pains.  He has GERD and takes PPI.  Currently on antianginal therapy, Imdur  120 mg in the a.m., 30 mg in the p.m., metoprolol  succinate 50 mg once daily and Ranexa  500 mg twice daily.  Does not have other symptoms of dizziness, syncope, palpitations.  He has leg swelling but has bad veins as well.  Does not use compression socks.  No orthopnea, PND.   Past Medical History:  Diagnosis Date   Anxiety    HX PANIC ATTACKS   CHF (congestive heart failure) (HCC)    Chronic cough    Coronary artery disease    a. s/p DES to LAD and PTCA of D1 in 2010 b. cath in 2016 showing patent stent with 50% stenosis of jailed D1 c. 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   Neuropathy    Penile lesion    Sjogren's disease (HCC)    Sleep apnea    USES C-PAP    Past Surgical History:  Procedure Laterality Date   BREAST SURGERY     BIL MASTECTOMY  FOR BENIGN LUMPS   CARDIAC CATHETERIZATION N/A 01/09/2015   Procedure: Left Heart Cath and Coronary Angiography;  Surgeon: Arleen Lacer, MD;  Location: [facility] INVASIVE CV LAB;  Service: Cardiovascular;  Laterality: N/A;   CARPAL TUNNELL  BIL   CIRCUMCISION     CORONARY ANGIOPLASTY WITH STENT PLACEMENT  X1 2010   HERNIA REPAIR     ING HERNIA REPAIR   LEFT HEART CATH AND CORONARY ANGIOGRAPHY N/A 10/15/2017   Procedure: LEFT HEART CATH AND CORONARY ANGIOGRAPHY;  Surgeon: Swaziland, Peter M, MD;  Location: [facility] INVASIVE CV LAB;  Service: Cardiovascular;  Laterality: N/A;   LEFT HEART CATHETERIZATION WITH CORONARY ANGIOGRAM N/A 10/11/2013   Procedure: LEFT HEART CATHETERIZATION WITH CORONARY ANGIOGRAM;  Surgeon: Mickiel Albany, MD;  Location: [facility] CATH LAB;  Service: Cardiovascular;  Laterality: N/A;   LEFT HEART CATHETERIZATION WITH CORONARY ANGIOGRAM N/A 06/21/2014   Procedure: LEFT HEART CATHETERIZATION WITH CORONARY ANGIOGRAM;  Surgeon: Millicent Ally, MD;  Location: [facility]  CATH LAB;  Service: Cardiovascular;  Laterality: N/A;   MEATOTOMY  X2   PENILE BIOPSY N/A 10/24/2022   Procedure: EXCISIONAL PENILE BIOPSY OF ULCER;  Surgeon: Homero Luster, MD;  Location: [facility] ORS;  Service: Urology;  Laterality: N/A;  45 MINS FOR CASE   PERCUTANEOUS CORONARY STENT INTERVENTION (PCI-S) N/A 06/23/2014   Procedure: PERCUTANEOUS CORONARY STENT INTERVENTION (PCI-S);  Surgeon: Lucendia Rusk, MD;  Location: [facility] CATH LAB;  Service: Cardiovascular;  Laterality: N/A;   VASECTOMY     WRIST FRACTURE SURGERY  L  X3 SURGERIES    Current Outpatient Medications  Medication Sig Dispense Refill   aspirin  EC 81 MG tablet Take 81 mg by mouth at bedtime.      atorvastatin  (LIPITOR) 40 MG tablet TAKE 1 TABLET EVERY EVENING 90 tablet 3    b complex vitamins tablet Take 1 tablet by mouth daily.     betamethasone valerate ointment (VALISONE) 0.1 % Apply 1 application topically 2 (two) times daily as needed (itching).     Butalbital -APAP-Caffeine  50-325-40 MG capsule Take 1-2 capsules by mouth as needed for headache. 30 capsule 0   Cholecalciferol  (VITAMIN D ) 2000 units tablet Take 2,000 Units by mouth daily.     citalopram  (CELEXA ) 20 MG tablet Take 20 mg by mouth daily.      clonazePAM  (KLONOPIN ) 0.5 MG tablet Take 1 tablet (0.5 mg total) by mouth See admin instructions. Take 1 or 2 tablets as needed for panick attacks 10 tablet 0   Coenzyme Q10 100 MG capsule Take 100 mg by mouth every other day.     diphenhydrAMINE  (BENADRYL ) 25 MG tablet Take 25-50 mg by mouth 2 (two) times daily as needed for itching.      fluticasone (CUTIVATE) 0.05 % cream Apply 1 application  topically 2 (two) times daily as needed (eczema). Applied to face and/or penis area(s)     furosemide  (LASIX ) 40 MG tablet Take 80 mg by mouth daily.     guaifenesin  (HUMIBID E) 400 MG TABS tablet Take 800 mg by mouth daily as needed (for post nasal drip/cough).      ibuprofen  (ADVIL ,MOTRIN ) 200 MG tablet Take 800 mg by mouth every 8 (eight) hours as needed for mild pain or moderate pain.     isosorbide  mononitrate (IMDUR ) 120 MG 24 hr tablet TAKE 1 TABLET EVERY DAY 90 tablet 3   isosorbide  mononitrate (IMDUR ) 30 MG 24 hr tablet TAKE 1 TABLET AT BEDTIME (IN ADDITION TO 120 MG TABLET IN THE MORNING) 90 tablet 3   L-Lysine 500 MG TABS Take 500 mg by mouth daily.     loperamide  (IMODIUM  A-D) 2 MG tablet Take 6 mg by mouth 4 (four) times daily as needed for diarrhea or loose stools.     metoprolol  succinate (TOPROL -XL) 50 MG 24 hr tablet Take 1 tablet (50 mg total) by mouth daily. Take with or immediately following a meal. 90 tablet 3   MISC NATURAL PRODUCTS PO Take by mouth. CBD gummies as needed for pain     Multiple Vitamins-Minerals (MULTIVITAMIN WITH MINERALS) tablet  Take 1 tablet by mouth every evening.      naloxone (NARCAN) nasal spray 4 mg/0.1 mL Place 0.4 mg into the nose once.     naproxen sodium (ALEVE) 220 MG tablet Take 220 mg by mouth daily as needed (pain).     nitroGLYCERIN  (NITROSTAT ) 0.4 MG SL tablet Take one every 5 minutes as needed for chest pain 25 tablet 3   NON FORMULARY  at bedtime. Pt uses a CPAP nightly     nystatin cream (MYCOSTATIN) Apply 1 Application topically daily as needed (yeast).     omeprazole (PRILOSEC) 40 MG capsule Take 40 mg by mouth daily before breakfast.     polyvinyl alcohol -povidone (HYPOTEARS) 1.4-0.6 % ophthalmic solution Place 1-2 drops into both eyes daily as needed (dry eyes).     potassium chloride  (MICRO-K ) 10 MEQ CR capsule Take 10 mEq by mouth daily.     ranolazine  (RANEXA ) 500 MG 12 hr tablet Take 1 tablet (500 mg total) by mouth 2 (two) times daily. 60 tablet 5   testosterone  cypionate (DEPOTESTOSTERONE CYPIONATE) 200 MG/ML injection Inject 100 mg into the muscle every 7 (seven) days.     vitamin C (ASCORBIC ACID ) 500 MG tablet Take 500 mg by mouth every morning.     No current facility-administered medications for this visit.   Allergies:  Bee venom, Duloxetine, Fentanyl , Lidocaine , Meperidine hcl, Methadone, Oxycodone , Oxycodone  hcl, Pregabalin, Zolpidem, and Zolpidem tartrate   Social History: The patient  reports that he quit smoking about 23 years ago. His smoking use included cigarettes. He started smoking about 56 years ago. He has a 82.7 pack-year smoking history. He has never used smokeless tobacco. He reports that he does not drink alcohol  and does not use drugs.   Family History: The patient's family history includes Heart disease in an other family member.   ROS:  Please see the history of present illness. Otherwise, complete review of systems is positive for none.  All other systems are reviewed and negative.   Physical Exam: VS:  There were no vitals taken for this visit., BMI There is no  height or weight on file to calculate BMI.  Wt Readings from Last 3 Encounters:  09/01/23 244 lb 6.4 oz (110.9 kg)  03/18/23 249 lb 3.2 oz (113 kg)  10/24/22 235 lb (106.6 kg)    General: Patient appears comfortable at rest. HEENT: Conjunctiva and lids normal, oropharynx clear with moist mucosa. Neck: Supple, no elevated JVP or carotid bruits, no thyromegaly. Lungs: Clear to auscultation, nonlabored breathing at rest. Cardiac: Regular rate and rhythm, no S3 or significant systolic murmur, no pericardial rub. Abdomen: Soft, nontender, no hepatomegaly, bowel sounds present, no guarding or rebound. Extremities: No pitting edema, distal pulses 2+. Skin: Warm and dry. Musculoskeletal: No kyphosis. Neuropsychiatric: Alert and oriented x3, affect grossly appropriate.  ECG:  An ECG dated 12/16/2021 was personally reviewed today and demonstrated:  Normal sinus rhythm with PACs.  Recent Labwork: No results found for requested labs within last 365 days.     Component Value Date/Time   CHOL 126 09/08/2023 1139   TRIG 90 09/08/2023 1139   HDL 38 (L) 09/08/2023 1139   CHOLHDL 3.3 09/08/2023 1139   CHOLHDL 4.9 10/11/2013 0357   VLDL 34 10/11/2013 0357   LDLCALC 71 09/08/2023 1139    Other Studies Reviewed Today: Echo in 2016 - Left ventricle: The cavity size was normal. Wall thickness was    increased in a pattern of mild LVH. Systolic function was normal.    The estimated ejection fraction was in the range of 60% to 65%.    Wall motion was normal; there were no regional wall motion    abnormalities. Left ventricular diastolic function parameters    were normal.  - Aortic valve: Mildly calcified annulus. Trileaflet; mildly    thickened leaflets. Valve area (VTI): 2.64 cm^2. Valve area    (Vmax): 2.8 cm^2.  - Mitral  valve: Mildly calcified annulus. Mildly thickened leaflets    .  - Left atrium: The atrium was moderately dilated.  - Right atrium: The atrium was mildly dilated.  -  Technically adequate study.   Cath in 2019 Ost 1st Diag to 1st Diag lesion is 70% stenosed. Ost 2nd Diag lesion is 50% stenosed. Acute Mrg lesion is 80% stenosed. Previously placed Prox LAD to Mid LAD stent (unknown type) is widely patent. The left ventricular systolic function is normal. LV end diastolic pressure is normal. The left ventricular ejection fraction is 55-65% by visual estimate.   1. Small vessel obstructive CAD with 70% ostial first diagonal ( jailed by stent) and 80% ostial RV marginal branch 2. Continued patency of stents in the LAD 3. Normal LV function 4. Normal LVEDP   Plan: really no change from last cardiac cath in August 2016. Recommend continued medical therapy.  Assessment and Plan:  #CAD status post LAD PCI in 2010 and 2016 with repeat LHC in 2019 showing patent stents with residual jailed diagonal 70% and RV marginal branch 80% stenosis with normal LVEF - New symptoms of angina lasting 1 to 2 minutes and occasionally radiating to his left shoulder x March 2025, occurring 3 times per week and waking him up from the sleep once a week.  Currently on Imdur  120 mg in the a.m., 30 mg in the p.m., metoprolol  succinate 50 mg once daily and ranolazine  500 mg twice daily.  Will increase ranolazine  dose from 5 mg to 1000 mg twice daily.  - Echocardiogram in May 2025 showed normal LVEF, normal RV function, no valvular heart disease, CVP 3 mmHg.  Small PFO and mild elevation of ascending aorta 40 mm is noted. -NM stress test in April 2025 showed findings consistent with prior inferior/inferoseptal infarction with no current ischemia.  He also had hypokinesis of inferior/inferoseptal wall on the stress test.  Nuclear LVEF was normal. - If his chest pains are refractory to medical management, we will schedule him for LHC.  He verbalized understanding of the plan. - Continue cardioprotective medications, Aspir 81 milligram once daily, atorvastatin  40 mg nightly.  S/p 0.5 mg as  needed.  ER precautions for chest pain provided.  #HLD, current at goal Plan - Continue atorvastatin  40 mg nightly, goal LDL 70.  LDL 57 in August 2024.  Repeat lipid panel.  #Chronic diastolic CHF, currently compensated Plan - Continue p.o. Lasix  80 mg once daily.  #OSA on CPAP Plan - Continue CPAP   Medication Adjustments/Labs and Tests Ordered: Current medicines are reviewed at length with the patient today.  Concerns regarding medicines are outlined above.    Disposition:  Follow up 3months  Signed, Yeilyn Gent Beauford Bounds, MD, 10/20/2023 10:12 AM    [facility] at [facility] [address], [place], [place] [address]

## 2023-10-20 NOTE — Patient Instructions (Signed)
 Medication Instructions:  Your physician has recommended you make the following change in your medication:  Increase Ranolazine  from 500 mg to 1000 mg twice daily Continue to take all other medications as prescribed   Labwork: None  Testing/Procedures: None  Follow-Up: Your physician recommends that you schedule a follow-up appointment in: 3 months  Any Other Special Instructions Will Be Listed Below (If Applicable). Thank you for choosing [facility]!     If you need a refill on your cardiac medications before your next appointment, please call your pharmacy.

## 2023-10-25 ENCOUNTER — Other Ambulatory Visit: Payer: Self-pay | Admitting: Internal Medicine

## 2023-10-26 DIAGNOSIS — E7849 Other hyperlipidemia: Secondary | ICD-10-CM | POA: Diagnosis not present

## 2023-10-26 DIAGNOSIS — Z1329 Encounter for screening for other suspected endocrine disorder: Secondary | ICD-10-CM | POA: Diagnosis not present

## 2023-10-26 DIAGNOSIS — Z1159 Encounter for screening for other viral diseases: Secondary | ICD-10-CM | POA: Diagnosis not present

## 2023-10-26 DIAGNOSIS — Z202 Contact with and (suspected) exposure to infections with a predominantly sexual mode of transmission: Secondary | ICD-10-CM | POA: Diagnosis not present

## 2023-10-26 DIAGNOSIS — I10 Essential (primary) hypertension: Secondary | ICD-10-CM | POA: Diagnosis not present

## 2023-10-26 DIAGNOSIS — E875 Hyperkalemia: Secondary | ICD-10-CM | POA: Diagnosis not present

## 2023-10-26 DIAGNOSIS — R739 Hyperglycemia, unspecified: Secondary | ICD-10-CM | POA: Diagnosis not present

## 2023-11-10 DIAGNOSIS — Z6836 Body mass index (BMI) 36.0-36.9, adult: Secondary | ICD-10-CM | POA: Diagnosis not present

## 2023-11-10 DIAGNOSIS — I2510 Atherosclerotic heart disease of native coronary artery without angina pectoris: Secondary | ICD-10-CM | POA: Diagnosis not present

## 2023-11-10 DIAGNOSIS — I5032 Chronic diastolic (congestive) heart failure: Secondary | ICD-10-CM | POA: Diagnosis not present

## 2023-11-10 DIAGNOSIS — J449 Chronic obstructive pulmonary disease, unspecified: Secondary | ICD-10-CM | POA: Diagnosis not present

## 2023-11-10 DIAGNOSIS — I10 Essential (primary) hypertension: Secondary | ICD-10-CM | POA: Diagnosis not present

## 2023-11-10 DIAGNOSIS — E291 Testicular hypofunction: Secondary | ICD-10-CM | POA: Diagnosis not present

## 2023-11-11 ENCOUNTER — Other Ambulatory Visit: Payer: Self-pay | Admitting: Internal Medicine

## 2024-01-02 ENCOUNTER — Other Ambulatory Visit: Payer: Self-pay | Admitting: Internal Medicine

## 2024-01-19 DIAGNOSIS — M1712 Unilateral primary osteoarthritis, left knee: Secondary | ICD-10-CM | POA: Diagnosis not present

## 2024-02-05 ENCOUNTER — Encounter: Payer: Self-pay | Admitting: Internal Medicine

## 2024-02-05 ENCOUNTER — Ambulatory Visit: Attending: Internal Medicine | Admitting: Internal Medicine

## 2024-02-05 VITALS — BP 116/74 | HR 68 | Ht 68.0 in | Wt 233.6 lb

## 2024-02-05 DIAGNOSIS — Z01810 Encounter for preprocedural cardiovascular examination: Secondary | ICD-10-CM

## 2024-02-05 NOTE — Progress Notes (Signed)
 Cardiology Office Note  Date: 02/05/2024   ID: [name], DOB [birth year], MRN [id number]  PCP:  Jeanette Comer BRAVO, PA-C  Cardiologist:  Diannah SHAUNNA Maywood, MD Electrophysiologist:  None    History of Present Illness: [name] is a 73 y.o. male known to have CAD status post LAD PCI in 2010 and 2016 with repeat LHC in 2019 showing patent stents with residual jailed diagonal 70% and RV marginal branch 80% stenosis with normal LVEF, HLD, OSA on CPAP, diastolic CHF presented to cardiology clinic for follow-up visit.   June 2025: New symptoms of DOE since March 2025.  He also developed new exertional chest pains occasionally radiating to his left shoulder lasting for about 1 to 2 minutes, waking him up from sleep once a week and occurring few times per week since March 2025.  I started him on Ranexa  500 mg twice daily in April 2025 after which he noticed improvement in his energy levels but continued to have similar chest pains.  He has GERD and takes PPI.  Currently on antianginal therapy, Imdur  120 mg in the a.m., 30 mg in the p.m., metoprolol  succinate 50 mg once daily and Ranexa  500 mg twice daily.    Patient is here today for follow-up visit of his CAD, HLD.  After increasing the dose of ranolazine  from 5 mg to 1000 mg twice daily, his chest pains and DOE completely resolved.  He reported having chest pains lasting for about 15 to 20 seconds in the last 3 to 4 months.  No other symptoms of DOE, dizziness, palpitations, leg swelling.  He is asking for preop cardiac stratification for left knee replacement surgery that he will be undergoing in [place] .   Past Medical History:  Diagnosis Date   Anxiety    HX PANIC ATTACKS   CHF (congestive heart failure) (HCC)    Chronic cough    Coronary artery disease    a. s/p DES to LAD and PTCA of D1 in 2010 b. cath in 2016 showing patent stent with 50% stenosis of jailed D1 c. cath in 10/2017 showing patent LAD stent with  70% D1 stenosis and 80% RV branch stenosis and medical management recommended   Dry eyes    Fibromyalgia    Fluid retention    GERD (gastroesophageal reflux disease)    Headache(784.0)    Herpes genitalis in men    History of kidney stones    History of vertigo    Hypertension    Myocardial infarction (HCC)    x 4  last MI 2019   Neuropathy    Penile lesion    Sjogren's disease    Sleep apnea    USES C-PAP    Past Surgical History:  Procedure Laterality Date   BREAST SURGERY     BIL MASTECTOMY  FOR BENIGN LUMPS   CARDIAC CATHETERIZATION N/A 01/09/2015   Procedure: Left Heart Cath and Coronary Angiography;  Surgeon: Alm LELON Clay, MD;  Location: [facility] INVASIVE CV LAB;  Service: Cardiovascular;  Laterality: N/A;   CARPAL TUNNELL  BIL   CIRCUMCISION     CORONARY ANGIOPLASTY WITH STENT PLACEMENT  X1 2010   HERNIA REPAIR     ING HERNIA REPAIR   LEFT HEART CATH AND CORONARY ANGIOGRAPHY N/A 10/15/2017   Procedure: LEFT HEART CATH AND CORONARY ANGIOGRAPHY;  Surgeon: Swazil[place], Peter M, MD;  Location: [facility] INVASIVE CV LAB;  Service: Cardiovascular;  Laterality: N/A;   LEFT HEART CATHETERIZATION WITH CORONARY ANGIOGRAM  N/A 10/11/2013   Procedure: LEFT HEART CATHETERIZATION WITH CORONARY ANGIOGRAM;  Surgeon: Victory LELON Claudene DOUGLAS, MD;  Location: [facility] CATH LAB;  Service: Cardiovascular;  Laterality: N/A;   LEFT HEART CATHETERIZATION WITH CORONARY ANGIOGRAM N/A 06/21/2014   Procedure: LEFT HEART CATHETERIZATION WITH CORONARY ANGIOGRAM;  Surgeon: Debby DELENA Sor, MD;  Location: [facility] CATH LAB;  Service: Cardiovascular;  Laterality: N/A;   MEATOTOMY  X2   PENILE BIOPSY N/A 10/24/2022   Procedure: EXCISIONAL PENILE BIOPSY OF ULCER;  Surgeon: Watt Rush, MD;  Location: [facility] ORS;  Service: Urology;  Laterality: N/A;  45 MINS FOR CASE   PERCUTANEOUS CORONARY STENT INTERVENTION (PCI-S) N/A 06/23/2014   Procedure: PERCUTANEOUS CORONARY STENT INTERVENTION (PCI-S);  Surgeon: Candyce GORMAN Reek, MD;  Location: [facility] CATH LAB;   Service: Cardiovascular;  Laterality: N/A;   VASECTOMY     WRIST FRACTURE SURGERY  L  X3 SURGERIES    Current Outpatient Medications  Medication Sig Dispense Refill   aspirin  EC 81 MG tablet Take 81 mg by mouth at bedtime.      atorvastatin  (LIPITOR) 40 MG tablet TAKE 1 TABLET EVERY EVENING 90 tablet 3   b complex vitamins tablet Take 1 tablet by mouth daily.     betamethasone valerate ointment (VALISONE) 0.1 % Apply 1 application topically 2 (two) times daily as needed (itching).     Butalbital -APAP-Caffeine  50-325-40 MG capsule Take 1-2 capsules by mouth as needed for headache. 30 capsule 0   Cholecalciferol  (VITAMIN D ) 2000 units tablet Take 2,000 Units by mouth daily.     citalopram  (CELEXA ) 20 MG tablet Take 20 mg by mouth daily.      clonazePAM  (KLONOPIN ) 0.5 MG tablet Take 1 tablet (0.5 mg total) by mouth See admin instructions. Take 1 or 2 tablets as needed for panick attacks 10 tablet 0   Coenzyme Q10 100 MG capsule Take 100 mg by mouth every other day.     diphenhydrAMINE  (BENADRYL ) 25 MG tablet Take 25-50 mg by mouth 2 (two) times daily as needed for itching.      fluticasone (CUTIVATE) 0.05 % cream Apply 1 application  topically 2 (two) times daily as needed (eczema). Applied to face and/or penis area(s)     furosemide  (LASIX ) 40 MG tablet Take 80 mg by mouth daily.     guaifenesin  (HUMIBID E) 400 MG TABS tablet Take 800 mg by mouth daily as needed (for post nasal drip/cough).      ibuprofen  (ADVIL ,MOTRIN ) 200 MG tablet Take 800 mg by mouth every 8 (eight) hours as needed for mild pain or moderate pain.     isosorbide  mononitrate (IMDUR ) 120 MG 24 hr tablet TAKE 1 TABLET EVERY DAY 90 tablet 3   isosorbide  mononitrate (IMDUR ) 30 MG 24 hr tablet TAKE 1 TABLET AT BEDTIME (IN ADDITION TO 120MG  TABLET IN THE MORNING) 90 tablet 3   L-Lysine 500 MG TABS Take 500 mg by mouth daily.     loperamide  (IMODIUM  A-D) 2 MG tablet Take 6 mg by mouth 4 (four) times daily as needed for diarrhea or  loose stools.     metoprolol  succinate (TOPROL -XL) 50 MG 24 hr tablet Take 1 tablet (50 mg total) by mouth daily. Take with or immediately following a meal. 90 tablet 3   MISC NATURAL PRODUCTS PO Take by mouth. CBD gummies as needed for pain     Multiple Vitamins-Minerals (MULTIVITAMIN WITH MINERALS) tablet Take 1 tablet by mouth every evening.      naloxone (NARCAN) nasal spray  4 mg/0.1 mL Place 0.4 mg into the nose once.     naproxen sodium (ALEVE) 220 MG tablet Take 220 mg by mouth daily as needed (pain).     nitroGLYCERIN  (NITROSTAT ) 0.4 MG SL tablet DISSOLVE 1 TABLET UNDER THE TONGUE EVERY 5 MINUTES AS NEEDED FOR CHEST PAIN AS DIRECTED 75 tablet 2   NON FORMULARY at bedtime. Pt uses a CPAP nightly     nystatin cream (MYCOSTATIN) Apply 1 Application topically daily as needed (yeast).     omeprazole (PRILOSEC) 40 MG capsule Take 40 mg by mouth daily before breakfast.     polyvinyl alcohol -povidone (HYPOTEARS) 1.4-0.6 % ophthalmic solution Place 1-2 drops into both eyes daily as needed (dry eyes).     potassium chloride  (MICRO-K ) 10 MEQ CR capsule Take 10 mEq by mouth daily.     ranolazine  (RANEXA ) 1000 MG SR tablet Take 1 tablet (1,000 mg total) by mouth 2 (two) times daily. 180 tablet 3   testosterone  cypionate (DEPOTESTOSTERONE CYPIONATE) 200 MG/ML injection Inject 100 mg into the muscle every 7 (seven) days.     vitamin C (ASCORBIC ACID ) 500 MG tablet Take 500 mg by mouth every morning.     No current facility-administered medications for this visit.   Allergies:  Bee venom, Duloxetine, Fentanyl , Lidocaine , Meperidine hcl, Methadone, Oxycodone , Oxycodone  hcl, Pregabalin, Zolpidem, and Zolpidem tartrate   Social History: The patient  reports that he quit smoking about 23 years ago. His smoking use included cigarettes. He started smoking about 56 years ago. He has a 82.7 pack-year smoking history. He has never used smokeless tobacco. He reports that he does not drink alcohol  and does not use  drugs.   Family History: The patient's family history includes Heart disease in an other family member.   ROS:  Please see the history of present illness. Otherwise, complete review of systems is positive for none.  All other systems are reviewed and negative.   Physical Exam: VS:  BP 116/74   Pulse 68   Ht 5' 8 (1.727 m)   Wt 233 lb 9.6 oz (106 kg)   SpO2 97%   BMI 35.52 kg/m , BMI Body mass index is 35.52 kg/m.  Wt Readings from Last 3 Encounters:  02/05/24 233 lb 9.6 oz (106 kg)  10/20/23 249 lb (112.9 kg)  09/01/23 244 lb 6.4 oz (110.9 kg)    General: Patient appears comfortable at rest. HEENT: Conjunctiva and lids normal, oropharynx clear with moist mucosa. Neck: Supple, no elevated JVP or carotid bruits, no thyromegaly. Lungs: Clear to auscultation, nonlabored breathing at rest. Cardiac: Regular rate and rhythm, no S3 or significant systolic murmur, no pericardial rub. Abdomen: Soft, nontender, no hepatomegaly, bowel sounds present, no guarding or rebound. Extremities: No pitting edema, distal pulses 2+. Skin: Warm and dry. Musculoskeletal: No kyphosis. Neuropsychiatric: Alert and oriented x3, affect grossly appropriate.  ECG:  An ECG dated 12/16/2021 was personally reviewed today and demonstrated:  Normal sinus rhythm with PACs.  Recent Labwork: No results found for requested labs within last 365 days.     Component Value Date/Time   CHOL 126 09/08/2023 1139   TRIG 90 09/08/2023 1139   HDL 38 (L) 09/08/2023 1139   CHOLHDL 3.3 09/08/2023 1139   CHOLHDL 4.9 10/11/2013 0357   VLDL 34 10/11/2013 0357   LDLCALC 71 09/08/2023 1139    Other Studies Reviewed Today: Echo in 2016 - Left ventricle: The cavity size was normal. Wall thickness was    increased in a  pattern of mild LVH. Systolic function was normal.    The estimated ejection fraction was in the range of 60% to 65%.    Wall motion was normal; there were no regional wall motion    abnormalities. Left  ventricular diastolic function parameters    were normal.  - Aortic valve: Mildly calcified annulus. Trileaflet; mildly    thickened leaflets. Valve area (VTI): 2.64 cm^2. Valve area    (Vmax): 2.8 cm^2.  - Mitral valve: Mildly calcified annulus. Mildly thickened leaflets    .  - Left atrium: The atrium was moderately dilated.  - Right atrium: The atrium was mildly dilated.  - Technically adequate study.   Cath in 2019 Ost 1st Diag to 1st Diag lesion is 70% stenosed. Ost 2nd Diag lesion is 50% stenosed. Acute Mrg lesion is 80% stenosed. Previously placed Prox LAD to Mid LAD stent (unknown type) is widely patent. The left ventricular systolic function is normal. LV end diastolic pressure is normal. The left ventricular ejection fraction is 55-65% by visual estimate.   1. Small vessel obstructive CAD with 70% ostial first diagonal ( jailed by stent) and 80% ostial RV marginal branch 2. Continued patency of stents in the LAD 3. Normal LV function 4. Normal LVEDP   Plan: really no change from last cardiac cath in August 2016. Recommend continued medical therapy.  Assessment and Plan:  # Preop cardiac risk stratification for left knee replacement surgery - Patient has stable angina, 3-4 episodes of chest pain lasting for 15 seconds in the last 3 months.  Continue current antianginal therapy.  He is at a low risk for any perioperative cardiac complications.  Keep hemoglobin more than 8 to prevent perioperative cardiac ischemia.  No further cardiac workup is indicated at this time. - Continue aspirin  81 mg once daily, no need to hold throughout the perioperative period.  # CAD status post LAD PCI in 2010 and 2016 with repeat LHC in 2019 showing patent stents with residual jailed diagonal 70% and RV marginal branch 80% stenosis with normal LVEF, has stable angina - New exertional chest pain occasionally radiating to his left shoulder since March 2025, initially occurring 3 times per week  and waking him up from sleep once a week.  After uptitrating antianginal therapy, he did not have any more anginal episodes.  He reported having 3-4 episodes of chest pains lasting for about 15 seconds in the last 3 months.  His chest pains are very well-controlled with current antianginal therapy. - Continue aspirin  81 mg once daily, atorvastatin  40 mg nightly. - Continue current antianginal therapy, metoprolol  succinate 50 mg once daily, Imdur  120 mg in the a.m. and 30 mg in the p.m., ranolazine  1000 mg twice daily. - Echocardiogram in May 2025 showed normal LVEF, normal RV function, no valvular heart disease, CVP 3 mmHg.  Small PFO and mild dilatation of ascending aorta 40 mm is noted. -NM stress test in April 2025 showed findings consistent with prior inferior/inferoseptal infarction with no current ischemia.  He also had hypokinesis of inferior/inferoseptal wall on the stress test.  Nuclear LVEF was normal. - If his chest pains are refractory to medical management, we will schedule him for LHC.  He verbalized understanding of the plan. - SL NTG 0.4 mg as needed for chest pain.  #HLD, current at goal Plan - Continue atorvastatin  40 mg nightly.  Goal LDL less than 70.  LDL 71 in April 2025.  #Chronic diastolic CHF, currently compensated Plan - Continue p.o.  Lasix  80 mg once daily.  #OSA on CPAP Plan - Continue CPAP.  30 minutes spent in review the prior records, more than 3 labs, discussion of the above problems with the patient and documentation.  Medication Adjustments/Labs and Tests Ordered: Current medicines are reviewed at length with the patient today.  Concerns regarding medicines are outlined above.    Disposition:  Follow up 3months  Signed, Jerra Huckeby Arleta Maywood, MD, 02/05/2024 10:31 AM     [facility] [address], [place], [place] [address]

## 2024-02-05 NOTE — Patient Instructions (Addendum)

## 2024-02-25 DIAGNOSIS — Z6835 Body mass index (BMI) 35.0-35.9, adult: Secondary | ICD-10-CM | POA: Diagnosis not present

## 2024-02-25 DIAGNOSIS — L0390 Cellulitis, unspecified: Secondary | ICD-10-CM | POA: Diagnosis not present

## 2024-05-17 ENCOUNTER — Encounter (INDEPENDENT_AMBULATORY_CARE_PROVIDER_SITE_OTHER): Payer: Self-pay | Admitting: *Deleted

## 2024-06-16 ENCOUNTER — Ambulatory Visit (INDEPENDENT_AMBULATORY_CARE_PROVIDER_SITE_OTHER): Admitting: Gastroenterology

## 2024-06-16 ENCOUNTER — Encounter (INDEPENDENT_AMBULATORY_CARE_PROVIDER_SITE_OTHER): Payer: Self-pay | Admitting: Gastroenterology

## 2024-06-16 VITALS — BP 120/73 | HR 59 | Temp 97.5°F | Ht 68.0 in | Wt 234.2 lb

## 2024-06-16 DIAGNOSIS — R7989 Other specified abnormal findings of blood chemistry: Secondary | ICD-10-CM | POA: Diagnosis not present

## 2024-06-16 DIAGNOSIS — B1910 Unspecified viral hepatitis B without hepatic coma: Secondary | ICD-10-CM | POA: Insufficient documentation

## 2024-06-16 NOTE — Progress Notes (Signed)
 "  Referring Provider: Skillman, Katherine E, PA-C Primary Care Physician:  Skillman, Katherine E, PA-C Primary GI Physician: New (Dr. Cinderella)   Chief Complaint  Patient presents with   New Patient (Initial Visit)    Patient here today as a new patient due to having elevated liver enzymes, and states he was told he had Hep B and was recovering from this. Wants to make sure this is cleared, and no liver damage had been done. Patient has occasional Luq to Left flank pain. Has dark urine, and has occasional itching and muscle pains.    HPI:  [name] is a 74 y.o. male with past medical history of anxiety, CHF, fibromyalgia, GERD, genital herpes, HTN, MI, sjogren's disease, sleep apnea  Patient presenting today for:  Elevated LFTs, abnormal Hepatitis B testing   US  abd complete with mild hepatomegaly with diffuse fatty change  Last labs in December with AST 153 ALT 235 T bili 1.5 AP 85 plt count 157k Hep B core Ab and Hep BsAg positive in December  HIV negative in June 2025   Present:  He states that he had labs in December and that liver enzymes were elevated which was a new finding for him. Reports he had a lot of labs done at [facility] around this time and was told they thought he had had Hepatitis B at some point in the past that had reactivated. He reports previously was having some itching, feeling sweaty, muscle pain, having some Left abdominal pain and darker urine with foul odor. He reports drinking about 3L of water per day. He denies any known history of hepatitis in the past. He denies any illicit IV drug use in the past, previously drank etoh but not for many years. He does use marijuana and delta 9 for pain management. Recently started kimchi tablets. Denies any energy drinks or other supplements. Denies jaundice or confusion.    NSAID use: avoids NSAIDs  Social hx: no etoh in 23 years, no tobacco for 24 years, uses delta 9 gummies and marijuana for pain management  Fam hx:  no CRC or liver disease   Last Colonoscopy:06/2020- Stool in the sigmoid colon, in the descending colon,                         at the splenic flexure and in the ascending colon.                         - No specimens collected.   Last Endoscopy:   Filed Weights   06/16/24 0902  Weight: 234 lb 3.2 oz (106.2 kg)     Past Medical History:  Diagnosis Date   Anxiety    HX PANIC ATTACKS   CHF (congestive heart failure) (HCC)    Chronic cough    Coronary artery disease    a. s/p DES to LAD and PTCA of D1 in 2010 b. cath in 2016 showing patent stent with 50% stenosis of jailed D1 c. 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 2019   Neuropathy    Penile lesion    Sjogren's disease    Sleep apnea    USES C-PAP    Past Surgical History:  Procedure Laterality Date   BREAST SURGERY     BIL MASTECTOMY  FOR BENIGN LUMPS   CARDIAC CATHETERIZATION N/A 01/09/2015   Procedure: Left Heart Cath and Coronary Angiography;  Surgeon: Alm LELON Clay, MD;  Location: [facility] INVASIVE CV LAB;  Service: Cardiovascular;  Laterality: N/A;   CARPAL TUNNELL  BIL   CIRCUMCISION     CORONARY ANGIOPLASTY WITH STENT PLACEMENT  X1 2010   HERNIA REPAIR     ING HERNIA REPAIR   LEFT HEART CATH AND CORONARY ANGIOGRAPHY N/A 10/15/2017   Procedure: LEFT HEART CATH AND CORONARY ANGIOGRAPHY;  Surgeon: Jordan, Peter M, MD;  Location: [facility] INVASIVE CV LAB;  Service: Cardiovascular;  Laterality: N/A;   LEFT HEART CATHETERIZATION WITH CORONARY ANGIOGRAM N/A 10/11/2013   Procedure: LEFT HEART CATHETERIZATION WITH CORONARY ANGIOGRAM;  Surgeon: Victory LELON Claudene DOUGLAS, MD;  Location: [facility] CATH LAB;  Service:  Cardiovascular;  Laterality: N/A;   LEFT HEART CATHETERIZATION WITH CORONARY ANGIOGRAM N/A 06/21/2014   Procedure: LEFT HEART CATHETERIZATION WITH CORONARY ANGIOGRAM;  Surgeon: Debby DELENA Sor, MD;  Location: [facility] CATH LAB;  Service: Cardiovascular;  Laterality: N/A;   MEATOTOMY  X2   PENILE BIOPSY N/A 10/24/2022   Procedure: EXCISIONAL PENILE BIOPSY OF ULCER;  Surgeon: Watt Rush, MD;  Location: [facility] ORS;  Service: Urology;  Laterality: N/A;  45 MINS FOR CASE   PERCUTANEOUS CORONARY STENT INTERVENTION (PCI-S) N/A 06/23/2014   Procedure: PERCUTANEOUS CORONARY STENT INTERVENTION (PCI-S);  Surgeon: Candyce GORMAN Reek, MD;  Location: [facility] CATH LAB;  Service: Cardiovascular;  Laterality: N/A;   VASECTOMY     WRIST FRACTURE SURGERY  L  X3 SURGERIES    Current Outpatient Medications  Medication Sig Dispense Refill   aspirin  EC 81 MG tablet Take 81 mg by mouth at bedtime.      atorvastatin  (LIPITOR) 40 MG tablet TAKE 1 TABLET EVERY EVENING 90 tablet 3   b complex vitamins tablet Take 1 tablet by mouth daily.     betamethasone valerate ointment (VALISONE) 0.1 % Apply 1 application topically 2 (two) times daily as needed (itching).     Butalbital -APAP-Caffeine  50-325-40 MG capsule Take 1-2 capsules by mouth as needed for headache. 30 capsule 0   Cholecalciferol  (VITAMIN D ) 2000 units tablet Take 2,000 Units by mouth daily.     clonazePAM  (KLONOPIN ) 0.5 MG tablet Take 1 tablet (0.5 mg total) by mouth See admin instructions. Take 1 or 2 tablets as needed for panick attacks 10 tablet 0   Coenzyme Q10 100 MG capsule Take 100 mg by mouth every other day.     diphenhydrAMINE  (BENADRYL ) 25 MG tablet Take 25-50 mg by mouth 2 (two) times daily as needed for itching.      fluticasone (CUTIVATE) 0.05 % cream Apply 1 application  topically 2 (two) times daily as needed (eczema). Applied to face and/or penis area(s)     furosemide  (LASIX ) 40 MG tablet Take 80 mg by mouth daily.     guaifenesin  (HUMIBID E) 400 MG TABS  tablet Take 800 mg by mouth daily as needed (for post nasal drip/cough).      ibuprofen  (ADVIL ,MOTRIN ) 200 MG tablet Take 800 mg by mouth every 8 (eight) hours as needed for mild pain or moderate pain.     isosorbide  mononitrate (IMDUR ) 120 MG  24 hr tablet TAKE 1 TABLET EVERY DAY 90 tablet 3   isosorbide  mononitrate (IMDUR ) 30 MG 24 hr tablet TAKE 1 TABLET AT BEDTIME (IN ADDITION TO 120MG  TABLET IN THE MORNING) 90 tablet 3   L-Lysine 500 MG TABS Take 500 mg by mouth daily.     loperamide  (IMODIUM  A-D) 2 MG tablet Take 6 mg by mouth 4 (four) times daily as needed for diarrhea or loose stools.     metoprolol  succinate (TOPROL -XL) 50 MG 24 hr tablet Take 1 tablet (50 mg total) by mouth daily. Take with or immediately following a meal. 90 tablet 3   MISC NATURAL PRODUCTS PO Take by mouth. CBD gummies as needed for pain     Multiple Vitamins-Minerals (MULTIVITAMIN WITH MINERALS) tablet Take 1 tablet by mouth every evening.      naloxone (NARCAN) nasal spray 4 mg/0.1 mL Place 0.4 mg into the nose once.     naproxen sodium (ALEVE) 220 MG tablet Take 220 mg by mouth daily as needed (pain).     nitroGLYCERIN  (NITROSTAT ) 0.4 MG SL tablet DISSOLVE 1 TABLET UNDER THE TONGUE EVERY 5 MINUTES AS NEEDED FOR CHEST PAIN AS DIRECTED 75 tablet 2   NON FORMULARY at bedtime. Pt uses a CPAP nightly     nystatin cream (MYCOSTATIN) Apply 1 Application topically daily as needed (yeast).     omeprazole (PRILOSEC) 40 MG capsule Take 40 mg by mouth daily before breakfast.     polyvinyl alcohol -povidone (HYPOTEARS) 1.4-0.6 % ophthalmic solution Place 1-2 drops into both eyes daily as needed (dry eyes).     potassium chloride  (MICRO-K ) 10 MEQ CR capsule Take 10 mEq by mouth daily.     ranolazine  (RANEXA ) 1000 MG SR tablet Take 1 tablet (1,000 mg total) by mouth 2 (two) times daily. 180 tablet 3   testosterone  cypionate (DEPOTESTOSTERONE CYPIONATE) 200 MG/ML injection Inject 100 mg into the muscle every 7 (seven) days.      vitamin C (ASCORBIC ACID ) 500 MG tablet Take 500 mg by mouth every morning.     No current facility-administered medications for this visit.    Allergies as of 06/16/2024 - Review Complete 06/16/2024  Allergen Reaction Noted   Bee venom Anaphylaxis 01/09/2015   Duloxetine Swelling    Fentanyl  Other (See Comments) 10/10/2013   Lidocaine  Other (See Comments)    Meperidine hcl Other (See Comments) 06/13/2008   Methadone Other (See Comments) 06/13/2008   Oxycodone   06/13/2008   Oxycodone  hcl Other (See Comments) 06/13/2008   Pregabalin Other (See Comments)    Zolpidem Other (See Comments) 01/02/2021   Zolpidem tartrate Other (See Comments)     Social History   Socioeconomic History   Marital status: Married    Spouse name: Not on file   Number of children: Not on file   Years of education: Not on file   Highest education level: Not on file  Occupational History   Not on file  Tobacco Use   Smoking status: Former    Current packs/day: 0.00    Average packs/day: 2.5 packs/day for 33.1 years (82.7 ttl pk-yrs)    Types: Cigarettes    Start date: 04/17/1967    Quit date: 05/12/2000    Years since quitting: 24.1   Smokeless tobacco: Never  Vaping Use   Vaping status: Never Used  Substance and Sexual Activity   Alcohol  use: No    Alcohol /week: 0.0 standard drinks of alcohol     Comment: RECOVERING ALCOHOLIC 9 YRS- 2003  Drug use: Yes    Types: Marijuana    Comment: Eats Delta 9 gummies prn pain.   Sexual activity: Not Currently  Other Topics Concern   Not on file  Social History Narrative   Not on file   Social Drivers of Health   Tobacco Use: Medium Risk (06/16/2024)   Patient History    Smoking Tobacco Use: Former    Smokeless Tobacco Use: Never    Passive Exposure: Not on Actuary Strain: Not on file  Food Insecurity: Not on file  Transportation Needs: Not on file  Physical Activity: Not on file  Stress: Not on file  Social Connections: Not on file   Depression (PHQ2-9): Not on file  Alcohol  Screen: Not on file  Housing: Not on file  Utilities: Not on file  Health Literacy: Not on file    Review of systems General: negative for malaise, night sweats, fever, chills, weight loss Neck: Negative for lumps, goiter, pain and significant neck swelling Resp: Negative for cough, wheezing, dyspnea at rest CV: Negative for chest pain, leg swelling, palpitations, orthopnea GI: denies melena, hematochezia, nausea, vomiting, diarrhea, constipation, dysphagia, odyonophagia, early satiety or unintentional weight loss. +left flank pain  MSK: Negative for joint pain or swelling, back pain, and muscle pain. Derm: Negative for itching or rash Psych: Denies depression, anxiety, memory loss, confusion. No homicidal or suicidal ideation.  Heme: Negative for prolonged bleeding, bruising easily, and swollen nodes. Endocrine: Negative for cold or heat intolerance, polyuria, polydipsia and goiter. Neuro: negative for tremor, gait imbalance, syncope and seizures. The remainder of the review of systems is noncontributory.  Physical Exam: BP 120/73 (BP Location: Left Arm, Patient Position: Sitting, Cuff Size: Large)   Pulse (!) 59   Temp (!) 97.5 F (36.4 C) (Temporal)   Ht 5' 8 (1.727 m)   Wt 234 lb 3.2 oz (106.2 kg)   BMI 35.61 kg/m  General:   Alert and oriented. No distress noted. Pleasant and cooperative.  Head:  Normocephalic and atraumatic. Eyes:  Conjuctiva clear without scleral icterus. Mouth:  Oral mucosa pink and moist. Good dentition. No lesions. Heart: Normal rate and rhythm, s1 and s2 heart sounds present.  Lungs: Clear lung sounds in all lobes. Respirations equal and unlabored. Abdomen:  +BS, soft, non-tender and non-distended. No rebound or guarding. No HSM or masses noted. Derm: No palmar erythema or jaundice Msk:  Symmetrical without gross deformities. Normal posture. Extremities:  Without edema. Neurologic:  Alert and  oriented  x4 Psych:  Alert and cooperative. Normal mood and affect.  Invalid input(s): 6 MONTHS   ASSESSMENT: [name] is a 74 y.o. male presenting today for elevated LFTs and abnormal Hepatitis B testing   Patient with acutely elevated LFTs in December, HepBs Ag and Hep B core Ab both positive with negative HCV Ab, negative HAV ab. He denies current or previous illicit IV drug use, no recent tattoos. No history of known hepatitis in the past. Denies herbal supplements other than delta 9 and uses marijuana for pain control. Notes some previous myalgias, no jaundice. US  as above with hepatomegaly and fatty infiltration. We discussed further labs and fibroscan to rule out fibrosis/cirrhosis prior to choosing most appropriate treatment.   I did discuss that left flank pain coupled with dark urine does not sound liver related. He has history of renal calculi, should follow up with PCP regarding these symptoms.    PLAN:  -CMP, CBC, INR, AFP -liver serologies to include (AMA,  ANA, ASMA, Ceruloplasmin, a1a, Hep B quant with reflex to genotype)  -refer for liver fibroscan to rule out cirrhosis prior to initiation of treatment   All questions were answered, patient verbalized understanding and is in agreement with plan as outlined above.   Follow Up: 1 month   Karthik Whittinghill L. Crislyn Willbanks, MSN, APRN, AGNP-C Adult-Gerontology Nurse Practitioner [facility]  "

## 2024-06-16 NOTE — Patient Instructions (Signed)
 We will do some further labs and get you scheduled for liver scan to evaluate for any scarring or cirrhosis of the liver  Follow up 1 month  It was a pleasure to see you today. I want to create trusting relationships with patients and provide genuine, compassionate, and quality care. I truly value your feedback! please be on the lookout for a survey regarding your visit with me today. I appreciate your input about our visit and your time in completing this!    Charlese Gruetzmacher L. Victoriya Pol, MSN, APRN, AGNP-C Adult-Gerontology Nurse Practitioner [facility]

## 2024-06-17 LAB — COMPREHENSIVE METABOLIC PANEL WITH GFR
AG Ratio: 1.5 (calc) (ref 1.0–2.5)
ALT: 98 U/L — ABNORMAL HIGH (ref 9–46)
AST: 73 U/L — ABNORMAL HIGH (ref 10–35)
Albumin: 3.8 g/dL (ref 3.6–5.1)
Alkaline phosphatase (APISO): 67 U/L (ref 35–144)
BUN: 13 mg/dL (ref 7–25)
CO2: 30 mmol/L (ref 20–32)
Calcium: 8.4 mg/dL — ABNORMAL LOW (ref 8.6–10.3)
Chloride: 101 mmol/L (ref 98–110)
Creat: 0.99 mg/dL (ref 0.70–1.28)
Globulin: 2.5 g/dL (ref 1.9–3.7)
Glucose, Bld: 104 mg/dL (ref 65–139)
Potassium: 3.7 mmol/L (ref 3.5–5.3)
Sodium: 139 mmol/L (ref 135–146)
Total Bilirubin: 1.4 mg/dL — ABNORMAL HIGH (ref 0.2–1.2)
Total Protein: 6.3 g/dL (ref 6.1–8.1)
eGFR: 80 mL/min/{1.73_m2}

## 2024-06-17 LAB — CBC
HCT: 42.4 % (ref 39.4–51.1)
Hemoglobin: 14.5 g/dL (ref 13.2–17.1)
MCH: 33.5 pg — ABNORMAL HIGH (ref 27.0–33.0)
MCHC: 34.2 g/dL (ref 31.6–35.4)
MCV: 97.9 fL (ref 81.4–101.7)
MPV: 9.9 fL (ref 7.5–12.5)
Platelets: 228 10*3/uL (ref 140–400)
RBC: 4.33 Million/uL (ref 4.20–5.80)
RDW: 13.1 % (ref 11.0–15.0)
WBC: 7.1 10*3/uL (ref 3.8–10.8)

## 2024-06-17 LAB — PROTIME-INR
INR: 1.1
Prothrombin Time: 11.4 s (ref 9.0–11.5)

## 2024-06-17 LAB — CERULOPLASMIN: Ceruloplasmin: 21 mg/dL (ref 14–30)

## 2024-06-17 LAB — ANA: Anti Nuclear Antibody (ANA): NEGATIVE

## 2024-08-04 ENCOUNTER — Ambulatory Visit (INDEPENDENT_AMBULATORY_CARE_PROVIDER_SITE_OTHER): Admitting: Gastroenterology
